# Patient Record
Sex: Male | Born: 1970
Health system: Southern US, Community
[De-identification: ages and names within clinical notes are randomized; demographics above are authoritative.]

## PROBLEM LIST (undated history)

## (undated) DIAGNOSIS — N289 Disorder of kidney and ureter, unspecified: Secondary | ICD-10-CM

## (undated) DIAGNOSIS — E119 Type 2 diabetes mellitus without complications: Secondary | ICD-10-CM

## (undated) HISTORY — PX: DG 3RD DIGIT RIGHT HAND: HXRAD1647

## (undated) HISTORY — PX: DG 4TH DIGIT RIGHT HAND: HXRAD1651

---

## 1997-08-27 ENCOUNTER — Emergency Department (HOSPITAL_COMMUNITY): Admission: EM | Admit: 1997-08-27 | Discharge: 1997-08-27 | Payer: Self-pay | Admitting: Emergency Medicine

## 1997-09-09 ENCOUNTER — Ambulatory Visit (HOSPITAL_BASED_OUTPATIENT_CLINIC_OR_DEPARTMENT_OTHER): Admission: RE | Admit: 1997-09-09 | Discharge: 1997-09-09 | Payer: Self-pay | Admitting: Orthopedic Surgery

## 1998-03-23 ENCOUNTER — Emergency Department (HOSPITAL_COMMUNITY): Admission: EM | Admit: 1998-03-23 | Discharge: 1998-03-23 | Payer: Self-pay | Admitting: Emergency Medicine

## 1998-04-04 ENCOUNTER — Emergency Department (HOSPITAL_COMMUNITY): Admission: EM | Admit: 1998-04-04 | Discharge: 1998-04-04 | Payer: Self-pay | Admitting: Emergency Medicine

## 1998-10-30 ENCOUNTER — Encounter: Payer: Self-pay | Admitting: Emergency Medicine

## 1998-10-30 ENCOUNTER — Emergency Department (HOSPITAL_COMMUNITY): Admission: EM | Admit: 1998-10-30 | Discharge: 1998-10-30 | Payer: Self-pay | Admitting: Emergency Medicine

## 2004-02-26 ENCOUNTER — Inpatient Hospital Stay (HOSPITAL_COMMUNITY): Admission: EM | Admit: 2004-02-26 | Discharge: 2004-02-28 | Payer: Self-pay | Admitting: Emergency Medicine

## 2004-02-26 ENCOUNTER — Ambulatory Visit: Payer: Self-pay | Admitting: Sports Medicine

## 2004-02-26 ENCOUNTER — Ambulatory Visit: Payer: Self-pay | Admitting: Gastroenterology

## 2004-03-22 ENCOUNTER — Emergency Department (HOSPITAL_COMMUNITY): Admission: EM | Admit: 2004-03-22 | Discharge: 2004-03-22 | Payer: Self-pay | Admitting: Emergency Medicine

## 2005-02-27 ENCOUNTER — Emergency Department (HOSPITAL_COMMUNITY): Admission: EM | Admit: 2005-02-27 | Discharge: 2005-02-27 | Payer: Self-pay | Admitting: Emergency Medicine

## 2006-02-25 ENCOUNTER — Emergency Department (HOSPITAL_COMMUNITY): Admission: EM | Admit: 2006-02-25 | Discharge: 2006-02-25 | Payer: Self-pay | Admitting: Emergency Medicine

## 2006-05-12 ENCOUNTER — Emergency Department (HOSPITAL_COMMUNITY): Admission: EM | Admit: 2006-05-12 | Discharge: 2006-05-12 | Payer: Self-pay | Admitting: Emergency Medicine

## 2006-10-23 ENCOUNTER — Ambulatory Visit: Payer: Self-pay | Admitting: *Deleted

## 2006-10-23 ENCOUNTER — Inpatient Hospital Stay (HOSPITAL_COMMUNITY): Admission: EM | Admit: 2006-10-23 | Discharge: 2006-10-25 | Payer: Self-pay | Admitting: *Deleted

## 2009-11-04 ENCOUNTER — Emergency Department (HOSPITAL_COMMUNITY)
Admission: EM | Admit: 2009-11-04 | Discharge: 2009-11-04 | Payer: Self-pay | Source: Home / Self Care | Admitting: Emergency Medicine

## 2010-06-14 NOTE — H&P (Signed)
[name], VACHA NO.:  [id number]   MEDICAL RECORD NO.:  [id number]          PATIENT TYPE:  IPS   LOCATION:  0303                          FACILITY:  [facility]   PHYSICIAN:  Jasmine Pang, M.D. DATE OF BIRTH:  [birth year]   DATE OF ADMISSION:  10/23/2006  DATE OF DISCHARGE:                       PSYCHIATRIC ADMISSION ASSESSMENT   IDENTIFYING INFORMATION:  This is a 40 year old separate African-  American male voluntarily admitted on October 23, 2006.   HISTORY OF PRESENT ILLNESS:  The patient presents with a history of  depression, polysubstance abuse and states that, before he did anything  stupid, he called the police.  He states that he is tired of doing  drugs, has been using marijuana and cocaine.  He states his use of  substances has been increasing, using almost daily, drinks alcohol on  rare occasions.  Having some passive suicidal thoughts.  He also reports  that he is having trouble sleeping, having problems going to sleep.  No  changes in his appetite.  Denies psychosis and is motivated to get help.   PAST PSYCHIATRIC HISTORY:  First admission to the [facility].  Is currently sponsored by [facility].  Was  detoxed prior about four years ago at [facility].  Reports a history of  overdosing in the past on sleeping pills.  Was not hospitalized.   SOCIAL HISTORY:  This is a 40 year old married African-American male.  Has been separated for four years.  The patient lives with a girlfriend  who he states is supportive.  He has two children, ages 48 and 27.  He is  unemployed, has been for nine months.  States, because of a felony where  he had sold drugs and a charge of larceny, he is unable to obtain  employment.  He is not on probation.   FAMILY HISTORY:  Kateri Mc was depressed.   ALCOHOL/DRUG HISTORY:  The patient smokes and alcohol habits as  described above.  Denies any IV drug use.   PRIMARY CARE PHYSICIAN:   None.   MEDICAL PROBLEMS:  Denies any acute or chronic health issues.   MEDICATIONS:  No current medication, has been on Zoloft in the past but  states he had problems with teeth clenching.   ALLERGIES:  No known allergies.   REVIEW OF SYSTEMS:  Muscular, well-nourished male.  No fever, no chills.  No changes in appetite.  Positive for insomnia.  No chest pain.  No  shortness of breath.  No nausea.  No vomiting or constipation.  No  seizures.  No headaches.  No joint tenderness.   PHYSICAL EXAMINATION:  VITAL SIGNS:  Temperature is 96.2, heart rate of  58, respirations 18, blood pressure 146/92, weight 236 pounds, height 6  feet tall.  GENERAL:  This is a muscular well-nourished male.  HEAD:  Head is atraumatic.  NECK:  Negative lymphadenopathy.  CHEST:  Clear.  No wheezing.  BREASTS:  Exam deferred.  HEART:  Regular rate and rhythm.  No murmurs, gallops or rubs.  ABDOMEN:  Soft, nontender abdomen.  PELVIC:  Exam was deferred.  GU:  Exam was deferred.  EXTREMITIES:  Moves all extremities.  No clubbing, no deformities. 5+  against resistance.  SKIN:  No rashes or lacerations.  There was a tattoo to his left arm.  NEUROLOGICAL:  Findings are intact and nonfocal.  No tremors.  Easily  performs heel-to-shin and normal alternating movements.   LABORATORY DATA:  Laboratory data is pending.   MENTAL STATUS EXAM:  This is a fully alert male, fair eye contact,  casually dressed, cooperative.  Speech is clear, normal pace and tone.  The patient's mood is depressed.  The patient's affect is somewhat flat  but agreeable.  Thought processes with no evidence of any thought  disorder.  Cognitive function intact.  Memory is good.  Judgment and  judgment is fair.   DIAGNOSES:  AXIS I:  Mood disorder.  Polysubstance abuse.  AXIS II:  Deferred.  AXIS III:  No acute or chronic health issues.  AXIS IV:  Problems with occupation, economic issues, psychosocial  problems.  AXIS V:  40-45.    PLAN:  Contract for safety.  Stabilize mood and thinking.  We will work  on relapse prevention.  We will have Symmetrel available for cocaine  cravings, Neurontin for anxiety, trazodone for sleep.  Will consider a  family session with his girlfriend.  [facility] is to assess follow-up  with the patient.  We will also assess comorbidities as the patient  continues to increase coping skills and work on his relapse.   TENTATIVE LENGTH OF STAY:  Three to four days.      Landry Corporal, N.P.      Jasmine Pang, M.D.  Electronically Signed    JO/MEDQ  D:  10/23/2006  T:  10/23/2006  Job:  [id number]

## 2010-06-17 NOTE — Discharge Summary (Signed)
[name], [name] NO.:  [id number]   MEDICAL RECORD NO.:  [id number]          PATIENT TYPE:  IPS   LOCATION:  0307                          FACILITY:  [facility]   PHYSICIAN:  Jasmine Pang, M.D. DATE OF BIRTH:  10/23/1970   DATE OF ADMISSION:  10/23/2006  DATE OF DISCHARGE:  10/25/2006                               DISCHARGE SUMMARY   IDENTIFYING INFORMATION:  This is a 40 year old separated African  American male who was admitted on a voluntary basis on October 23, 2006.   HISTORY OF PRESENT ILLNESS:  The patient presents with a history of  depression and polysubstance abuse.  He states that before he did  anything stupid he called the police.  He states he was tired of doing  drugs and had been using marijuana and cocaine.  He reported that his  use of substances has been increasing, and he is using almost daily.  He  also drinks alcohol on rare occasions.  He has been having some passive  suicidal thoughts.  He reports that he has been having trouble sleeping,  having problems going to sleep.  He has no changes in his appetite.  He  denies psychosis and is motivated to get help.  This is the first  admission to [facility].  He is currently sponsored by  [facility].  He was detoxed prior about 4  years ago at [facility].  He reports a history of overdosing in  the past on sleeping pills.  He was not hospitalized then.  He has an  uncle who was depressed.  No other remarkable family history.  He denies  any IV drug use.  He denies any acute or chronic health issues.  He is  on no current medication.  He has been on Zoloft in the past, but states  he had problems with teeth clenching.  He has no known drug allergies.   PHYSICAL FINDINGS:  Physical exam was within normal limits.  There were  no acute medical or physical problems noted.   ADMISSION LABORATORIES:  CBC was remarkable for a slightly elevated WBC  count of 11.3.  Routine chemistry panel was within normal limits, except  for slightly decreased sodium of 133.  The urine drug screen was  positive for marijuana and cocaine.   HOSPITAL COURSE:  Upon admission, the patient was started on trazodone  50 mg p.o. q.h.s. p.r.n. insomnia and Symmetrel 100 mg p.o. b.i.d.  He  was also given a nicotine 21 mg patch as per smoking cessation protocol.  He was prescribed Allegra 60 mg one p.o. b.i.d. p.r.n. symptoms and  Robitussin 10 mL q. 4h. p.r.n. cough.  The patient tolerated these  medications well with no significant side effects.  Upon admission, the  patient discussed feeling worried he would hurt someone.  He called the  police who took him to [facility], and they sent him to [place].  He  denied any suicidal intent, but did state he had been having passive  suicidal ideation.  He  admitted to smoking marijuana daily and also used  crack cocaine.  He wanted detox.  He stated his family was not very  supportive.  He reportedly threatened to walk into traffic if he did not  get help.  On October 24, 2006, mental status had improved from  admission status.  There was some dysphoria and anxiety, but he was no  longer suicidal or homicidal.  There was still some difficulty sleeping,  but his appetite was improving.  No auditory or visual hallucinations.  He stated he planned to go to AA meetings and therapy after he left the  hospital.  On October 25, 2006, mental status had improved markedly  from admission status.  The patient was friendly and cooperative with  good eye contact.  Speech was normal rate and flow.  Psychomotor  activity was within normal limits.  Mood euthymic.  Affect wide range.  No suicidal or homicidal ideation.  No thoughts of self-injurious  behavior.  No auditory or visual hallucinations.  No paranoid delusions.  Thoughts were logical and goal-directed.  Thought content no predominant  theme.  Cognitive was grossly  back to baseline.  The patient was felt  safe to be discharged today.  He will go to [facility] his  med[facility]  He  plans to return home.   DISCHARGE DIAGNOSES:  AXIS I:  Mood disorder, not otherwise specified.  Polysubstance abuse.  AXIS II:  None.  AXIS III:  No acute or chronic health issues.  AXIS IV:  Severe (problems with occupation and economic issues,  psychosocial problems, and burden of psychiatric illness).  AXIS V:  Global assessment of functioning was 50 at discharge.  Global  assessment of functioning was 40 upon admission.  Global assessment of  functioning highest past year was 65.   DISCHARGE PLANS:  There were no specific activity level or dietary  restrictions.   POST-HOSPITAL CARE PLANS:  The patient will go to [facility].  He will also be seen at [facility] and [facility] for  counseling.   DISCHARGE MEDICATIONS:  Amantadine 100 mg p.o. b.i.d. and trazodone 50  mg p.o. q.h.s.      Jasmine Pang, M.D.  Electronically Signed     BHS/MEDQ  D:  11/15/2006  T:  11/16/2006  Job:  [id number]

## 2010-06-17 NOTE — Discharge Summary (Signed)
[name], [name]                  ACCOUNT NO.:  [id number]   MEDICAL RECORD NO.:  [id number]          PATIENT TYPE:  INP   LOCATION:  4739                         FACILITY:  [facility]   PHYSICIAN:  Madeleine B. Vanstory, M.D.DATE OF BIRTH:  1970-12-05   DATE OF ADMISSION:  02/26/2004  DATE OF DISCHARGE:  02/28/2004                                 DISCHARGE SUMMARY   DISCHARGE DIAGNOSES:  1.  Gastric ulcers.  2.  Helicobacter pylori.  3.  Substance abuse.  4.  EKG changes.  5.  History of suicide attempt.   DISCHARGE MEDICATIONS:  1.  Amoxicillin 1000 mg p.o. b.i.d. x14 days.  2.  Clarithromycin 500 mg p.o. b.i.d. x14 days.  3.  Prilosec 40 mg p.o. b.i.d.   HOSPITAL COURSE:  [age]-year-old male with 10-year history of  intermittent crack cocaine abuse who presented with hemoptysis and melena.   Problem 1. GASTROINTESTINAL.  Patient with nausea, vomiting, diarrhea,  melena and hemoptysis, no frank blood per rectum.  GI consult ordered.  Patient positive for H. pylori and positive for numerous antral gastric  ulcers.  Patient started on PPI in the hospital and discharged with triple  therapy.  Patient to follow up output and to avoid alcohol, cigarettes and  NSAIDs as patient's suicide attempt was with numerous Tylenol and Advil  likely causing the gastritis.  Patient does not drink.   Problem 2. POLYSUBSTANCE ABUSE.  Patient has used crack cocaine  intermittently x10 years, last used December 2005 with suicide attempt;  patient states he has not used since then.  Patient amenable to rehab and  counseling.  Resources for substance abuse given at time of discharge.   Problem 3. EKG CHANGES.  Likely secondary to crack cocaine use.  Patient  advised on the EKG changes and that patient did not have a heart attack but  is headed that way and patient should cease using these drugs in the future.   Problem 4. HISTORY OF SUICIDE ATTEMPT.  Patient is not found depressed or  with  suicidal ideation at this time.  Note on chart police department wants  to be made aware at time of discharge as police to come to hospital to  interview patient prior to discharge.   DISCHARGE CONDITION:  Stable.   DISCHARGE INSTRUCTIONS:  Follow up at family practice.  Call clinic for an  appointment [phone].      MBV/MEDQ  D:  02/28/2004  T:  02/28/2004  Job:  [id number]

## 2010-11-10 LAB — URINE DRUGS OF ABUSE SCREEN W ALC, ROUTINE (REF LAB)
Amphetamine Screen, Ur: NEGATIVE
Benzodiazepines.: NEGATIVE
Cocaine Metabolites: POSITIVE — AB
Methadone: NEGATIVE
Opiate Screen, Urine: NEGATIVE
Phencyclidine (PCP): NEGATIVE
Propoxyphene: NEGATIVE

## 2010-11-10 LAB — COMPREHENSIVE METABOLIC PANEL
ALT: 16
AST: 16
Calcium: 9.2
Creatinine, Ser: 1.18
GFR calc Af Amer: >60
GFR calc non Af Amer: >60
Sodium: 133 — ABNORMAL LOW
Total Protein: 6.7

## 2010-11-10 LAB — COCAINE, URINE, CONFIRMATION: Benzoylecgonine GC/MS Conf: 29000 ng/mL

## 2010-11-10 LAB — THC (MARIJUANA), URINE, CONFIRMATION: Marijuana, Ur-Confirmation: 140 ng/mL

## 2010-11-10 LAB — CBC
MCHC: 35.1
MCV: 90.7
RDW: 13

## 2011-04-13 ENCOUNTER — Emergency Department (HOSPITAL_COMMUNITY)
Admission: EM | Admit: 2011-04-13 | Discharge: 2011-04-14 | Disposition: A | Discharge status: Home / Self Care | Payer: Self-pay | Attending: Emergency Medicine | Admitting: Emergency Medicine

## 2011-04-13 DIAGNOSIS — F17200 Nicotine dependence, unspecified, uncomplicated: Secondary | ICD-10-CM | POA: Insufficient documentation

## 2011-04-13 DIAGNOSIS — K4090 Unilateral inguinal hernia, without obstruction or gangrene, not specified as recurrent: Secondary | ICD-10-CM | POA: Insufficient documentation

## 2011-04-13 DIAGNOSIS — M5430 Sciatica, unspecified side: Secondary | ICD-10-CM | POA: Insufficient documentation

## 2011-04-13 NOTE — ED Notes (Signed)
Pt c/o abd pain on the lt  abd for one year and is worse when he coughs

## 2011-04-14 ENCOUNTER — Encounter (HOSPITAL_COMMUNITY): Payer: Self-pay | Admitting: *Deleted

## 2011-04-14 LAB — URINALYSIS, ROUTINE W REFLEX MICROSCOPIC
Bilirubin Urine: NEGATIVE
Glucose, UA: NEGATIVE mg/dL
Ketones, ur: NEGATIVE mg/dL
Leukocytes, UA: NEGATIVE
Nitrite: NEGATIVE
Specific Gravity, Urine: 1.013 (ref 1.005–1.030)
pH: 6 (ref 5.0–8.0)

## 2011-04-14 LAB — URINE MICROSCOPIC-ADD ON

## 2011-04-14 MED ORDER — IBUPROFEN 600 MG PO TABS: 600.0000 mg | ORAL_TABLET | Freq: Four times a day (QID) | ORAL | Status: AC | PRN

## 2011-04-14 MED ORDER — CYCLOBENZAPRINE HCL 10 MG PO TABS: 10.0000 mg | ORAL_TABLET | Freq: Three times a day (TID) | ORAL | Status: AC | PRN

## 2011-04-14 NOTE — Discharge Instructions (Signed)
Hernia  A hernia occurs when an internal organ pushes out through a weak spot in the abdominal wall. Hernias most commonly occur in the groin and around the navel. Hernias often can be pushed back into place (reduced). Most hernias tend to get worse over time. Some abdominal hernias can get stuck in the opening (irreducible or incarcerated hernia) and cannot be reduced. An irreducible abdominal hernia which is tightly squeezed into the opening is at risk for impaired blood supply (strangulated hernia). A strangulated hernia is a medical emergency. Because of the risk for an irreducible or strangulated hernia, surgery may be recommended to repair a hernia.  CAUSES    Heavy lifting.   Prolonged coughing.   Straining to have a bowel movement.   A cut (incision) made during an abdominal surgery.  HOME CARE INSTRUCTIONS    Bed rest is not required. You may continue your normal activities.   Avoid lifting more than 10 pounds (4.5 kg) or straining.   Cough gently. If you are a smoker it is best to stop. Even the best hernia repair can break down with the continual strain of coughing. Even if you do not have your hernia repaired, a cough will continue to aggravate the problem.   Do not wear anything tight over your hernia. Do not try to keep it in with an outside bandage or truss. These can damage abdominal contents if they are trapped within the hernia sac.   Eat a normal diet.   Avoid constipation. Straining over long periods of time will increase hernia size and encourage breakdown of repairs. If you cannot do this with diet alone, stool softeners may be used.  SEEK IMMEDIATE MEDICAL CARE IF:    You have a fever.   You develop increasing abdominal pain.   You feel nauseous or vomit.   Your hernia is stuck outside the abdomen, looks discolored, feels hard, or is tender.   You have any changes in your bowel habits or in the hernia that are unusual for you.   You have increased pain or swelling around the  hernia.   You cannot push the hernia back in place by applying gentle pressure while lying down.  MAKE SURE YOU:    Understand these instructions.   Will watch your condition.   Will get help right away if you are not doing well or get worse.  Document Released: 01/16/2005 Document Revised: 01/05/2011 Document Reviewed: 09/05/2007  ExitCare Patient Information 2012 [employer].

## 2011-04-14 NOTE — ED Provider Notes (Signed)
History     CSN: [id number]  Arrival date & time 04/13/11  2340   First MD Initiated Contact with Patient 04/14/11 0200      Chief Complaint  Patient presents with  . Abdominal Pain    (Consider location/radiation/quality/duration/timing/severity/associated sxs/prior treatment) HPI Comments: [name] is a chronic intermittent low back pain radiating to his left leg for the past year without any history of injury.  He also has noticed, that he's got a bulge in his left groin gets worse when he coughs for the last month and a half  Patient is a 41 y.o. male presenting with abdominal pain. The history is provided by the patient.  Abdominal Pain The primary symptoms of the illness include abdominal pain. The primary symptoms of the illness do not include fever, nausea or vomiting.  Additional symptoms associated with the illness include back pain.    History reviewed. No pertinent past medical history.  History reviewed. No pertinent past surgical history.  History reviewed. No pertinent family history.  History  Substance Use Topics  . Smoking status: Current Everyday Smoker  . Smokeless tobacco: Not on file  . Alcohol Use: Yes      Review of Systems  Constitutional: Negative for fever.  Gastrointestinal: Positive for abdominal pain. Negative for nausea and vomiting.  Genitourinary: Negative for flank pain, discharge, genital sores and testicular pain.  Musculoskeletal: Positive for back pain.  Neurological: Negative for dizziness and weakness.    Allergies  Review of patient's allergies indicates no known allergies.  Home Medications   Current Outpatient Rx  Name Route Sig Dispense Refill  . CYCLOBENZAPRINE HCL 10 MG PO TABS Oral Take 1 tablet (10 mg total) by mouth 3 (three) times daily as needed for muscle spasms. 30 tablet 0  . IBUPROFEN 600 MG PO TABS Oral Take 1 tablet (600 mg total) by mouth every 6 (six) hours as needed for pain. 30 tablet 0    BP 144/95   Pulse 65  Temp(Src) 97.6 F (36.4 C) (Oral)  Resp 18  SpO2 100%  Physical Exam  Constitutional: He appears well-developed and well-nourished.  HENT:  Head: Normocephalic.  Eyes: Pupils are equal, round, and reactive to light.  Neck: Normal range of motion.  Cardiovascular: Normal rate.   Pulmonary/Chest: Effort normal.  Abdominal: Soft. He exhibits no distension. A hernia is present.    Neurological: He is alert.  Skin: Skin is warm.    ED Course  Procedures (including critical care time)  Labs Reviewed  URINALYSIS, ROUTINE W REFLEX MICROSCOPIC - Abnormal; Notable for the following:    APPearance CLOUDY (*)    Hgb urine dipstick LARGE (*)    All other components within normal limits  URINE MICROSCOPIC-ADD ON   No results found.   1. Sciatica   2. Left inguinal hernia       MDM  She has a small left inguinal defect, which becomes more pronounced when he coughs.  Small hernia that becomes visible was easily reduced        Arman Filter, NP 04/14/11 0343  Arman Filter, NP 04/14/11 0343  Arman Filter, NP 04/14/11 0405  Arman Filter, NP 04/14/11 4098

## 2011-04-14 NOTE — ED Provider Notes (Signed)
Medical screening examination/treatment/procedure(s) were performed by non-physician practitioner and as supervising physician I was immediately available for consultation/collaboration.   Dayton Bailiff, MD 04/14/11 1006

## 2011-04-14 NOTE — ED Notes (Signed)
Patient found in waiting room, patient moved by to PDA 10.

## 2011-09-23 ENCOUNTER — Emergency Department (HOSPITAL_COMMUNITY)
Admission: EM | Admit: 2011-09-23 | Discharge: 2011-09-23 | Disposition: A | Discharge status: Home / Self Care | Payer: No Typology Code available for payment source | Attending: Emergency Medicine | Admitting: Emergency Medicine

## 2011-09-23 ENCOUNTER — Emergency Department (HOSPITAL_COMMUNITY): Payer: No Typology Code available for payment source

## 2011-09-23 ENCOUNTER — Encounter (HOSPITAL_COMMUNITY): Payer: Self-pay | Admitting: *Deleted

## 2011-09-23 DIAGNOSIS — Y9289 Other specified places as the place of occurrence of the external cause: Secondary | ICD-10-CM | POA: Insufficient documentation

## 2011-09-23 DIAGNOSIS — F17200 Nicotine dependence, unspecified, uncomplicated: Secondary | ICD-10-CM | POA: Insufficient documentation

## 2011-09-23 DIAGNOSIS — S335XXA Sprain of ligaments of lumbar spine, initial encounter: Secondary | ICD-10-CM | POA: Insufficient documentation

## 2011-09-23 DIAGNOSIS — M545 Low back pain, unspecified: Secondary | ICD-10-CM | POA: Insufficient documentation

## 2011-09-23 DIAGNOSIS — S39012A Strain of muscle, fascia and tendon of lower back, initial encounter: Secondary | ICD-10-CM

## 2011-09-23 MED ORDER — IBUPROFEN 800 MG PO TABS
800.0000 mg | ORAL_TABLET | Freq: Once | ORAL | Status: AC
  Administered 2011-09-23: 800 mg via ORAL
  Filled 2011-09-23: qty 1

## 2011-09-23 MED ORDER — HYDROCODONE-ACETAMINOPHEN 5-325 MG PO TABS
1.0000 | ORAL_TABLET | Freq: Once | ORAL | Status: AC
  Administered 2011-09-23: 1 via ORAL
  Filled 2011-09-23: qty 1

## 2011-09-23 MED ORDER — CYCLOBENZAPRINE HCL 10 MG PO TABS: 10.0000 mg | ORAL_TABLET | Freq: Three times a day (TID) | ORAL | Status: AC | PRN

## 2011-09-23 MED ORDER — HYDROCODONE-ACETAMINOPHEN 5-325 MG PO TABS: 1.0000 | ORAL_TABLET | Freq: Four times a day (QID) | ORAL | Status: AC | PRN

## 2011-09-23 MED ORDER — IBUPROFEN 800 MG PO TABS: 800.0000 mg | ORAL_TABLET | Freq: Three times a day (TID) | ORAL | Status: AC | PRN
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
## 2011-09-24 NOTE — ED Provider Notes (Signed)
Medical screening examination/treatment/procedure(s) were performed by non-physician practitioner and as supervising physician I was immediately available for consultation/collaboration.   Jameela Michna Y. Wayland Baik, MD 09/24/11 0725 

## 2013-03-03 ENCOUNTER — Ambulatory Visit: Payer: Self-pay

## 2013-08-25 ENCOUNTER — Encounter: Payer: Self-pay | Admitting: Internal Medicine

## 2013-08-25 ENCOUNTER — Ambulatory Visit: Payer: Self-pay | Attending: Internal Medicine | Admitting: Internal Medicine

## 2013-08-25 VITALS — BP 143/89 | HR 77 | Temp 98.6°F | Resp 16 | Ht 71.0 in | Wt 233.0 lb

## 2013-08-25 DIAGNOSIS — R220 Localized swelling, mass and lump, head: Secondary | ICD-10-CM

## 2013-08-25 DIAGNOSIS — R229 Localized swelling, mass and lump, unspecified: Secondary | ICD-10-CM | POA: Insufficient documentation

## 2013-08-25 DIAGNOSIS — R221 Localized swelling, mass and lump, neck: Secondary | ICD-10-CM

## 2013-08-25 DIAGNOSIS — Z0000 Encounter for general adult medical examination without abnormal findings: Secondary | ICD-10-CM | POA: Insufficient documentation

## 2013-08-25 DIAGNOSIS — F17200 Nicotine dependence, unspecified, uncomplicated: Secondary | ICD-10-CM | POA: Insufficient documentation

## 2013-08-25 LAB — CBC WITH DIFFERENTIAL/PLATELET
Basophils Absolute: 0.1 10*3/uL (ref 0.0–0.1)
Basophils Relative: 1 % (ref 0–1)
Eosinophils Absolute: 0.3 10*3/uL (ref 0.0–0.7)
Eosinophils Relative: 3 % (ref 0–5)
HCT: 42.5 % (ref 39.0–52.0)
HEMOGLOBIN: 14.7 g/dL (ref 13.0–17.0)
LYMPHS ABS: 3.8 10*3/uL (ref 0.7–4.0)
LYMPHS PCT: 39 % (ref 12–46)
MCH: 31.7 pg (ref 26.0–34.0)
MCHC: 34.6 g/dL (ref 30.0–36.0)
MCV: 91.6 fL (ref 78.0–100.0)
MONOS PCT: 10 % (ref 3–12)
Monocytes Absolute: 1 10*3/uL (ref 0.1–1.0)
NEUTROS ABS: 4.6 10*3/uL (ref 1.7–7.7)
NEUTROS PCT: 47 % (ref 43–77)
Platelets: 425 10*3/uL — ABNORMAL HIGH (ref 150–400)
RBC: 4.64 MIL/uL (ref 4.22–5.81)
RDW: 13.1 % (ref 11.5–15.5)
WBC: 9.7 10*3/uL (ref 4.0–10.5)

## 2013-08-25 LAB — POCT URINALYSIS DIPSTICK
GLUCOSE UA: NEGATIVE
NITRITE UA: NEGATIVE
Protein, UA: 300
Spec Grav, UA: >=1.03
Urobilinogen, UA: 1
pH, UA: 6

## 2013-08-25 LAB — GLUCOSE, POCT (MANUAL RESULT ENTRY): POC Glucose: 92 mg/dl (ref 70–99)

## 2013-08-25 LAB — POCT GLYCOSYLATED HEMOGLOBIN (HGB A1C): HEMOGLOBIN A1C: 4.6

## 2013-08-25 MED ORDER — IBUPROFEN 800 MG PO TABS: 800.0000 mg | ORAL_TABLET | Freq: Three times a day (TID) | ORAL | Status: DC | PRN

## 2013-08-25 MED ORDER — CYCLOBENZAPRINE HCL 10 MG PO TABS: 10.0000 mg | ORAL_TABLET | Freq: Three times a day (TID) | ORAL | Status: DC | PRN

## 2013-08-25 NOTE — Patient Instructions (Signed)
Back Pain, Adult Low back pain is very common. About 1 in 5 people have back pain.The cause of low back pain is rarely dangerous. The pain often gets better over time.About half of people with a sudden onset of back pain feel better in just 2 weeks. About 8 in 10 people feel better by 6 weeks.  CAUSES Some common causes of back pain include:  Strain of the muscles or ligaments supporting the spine.  Wear and tear (degeneration) of the spinal discs.  Arthritis.  Direct injury to the back. DIAGNOSIS Most of the time, the direct cause of low back pain is not known.However, back pain can be treated effectively even when the exact cause of the pain is unknown.Answering your caregiver's questions about your overall health and symptoms is one of the most accurate ways to make sure the cause of your pain is not dangerous. If your caregiver needs more information, he or she may order lab work or imaging tests (X-rays or MRIs).However, even if imaging tests show changes in your back, this usually does not require surgery. HOME CARE INSTRUCTIONS For many people, back pain returns.Since low back pain is rarely dangerous, it is often a condition that people can learn to manageon their own.   Remain active. It is stressful on the back to sit or stand in one place. Do not sit, drive, or stand in one place for more than 30 minutes at a time. Take short walks on level surfaces as soon as pain allows.Try to increase the length of time you walk each day.  Do not stay in bed.Resting more than 1 or 2 days can delay your recovery.  Do not avoid exercise or work.Your body is made to move.It is not dangerous to be active, even though your back may hurt.Your back will likely heal faster if you return to being active before your pain is gone.  Pay attention to your body when you bend and lift. Many people have less discomfortwhen lifting if they bend their knees, keep the load close to their bodies,and  avoid twisting. Often, the most comfortable positions are those that put less stress on your recovering back.  Find a comfortable position to sleep. Use a firm mattress and lie on your side with your knees slightly bent. If you lie on your back, put a pillow under your knees.  Only take over-the-counter or prescription medicines as directed by your caregiver. Over-the-counter medicines to reduce pain and inflammation are often the most helpful.Your caregiver may prescribe muscle relaxant drugs.These medicines help dull your pain so you can more quickly return to your normal activities and healthy exercise.  Put ice on the injured area.  Put ice in a plastic bag.  Place a towel between your skin and the bag.  Leave the ice on for 15-20 minutes, 03-04 times a day for the first 2 to 3 days. After that, ice and heat may be alternated to reduce pain and spasms.  Ask your caregiver about trying back exercises and gentle massage. This may be of some benefit.  Avoid feeling anxious or stressed.Stress increases muscle tension and can worsen back pain.It is important to recognize when you are anxious or stressed and learn ways to manage it.Exercise is a great option. SEEK MEDICAL CARE IF:  You have pain that is not relieved with rest or medicine.  You have pain that does not improve in 1 week.  You have new symptoms.  You are generally not feeling well. SEEK   IMMEDIATE MEDICAL CARE IF:   You have pain that radiates from your back into your legs.  You develop new bowel or bladder control problems.  You have unusual weakness or numbness in your arms or legs.  You develop nausea or vomiting.  You develop abdominal pain.  You feel faint. Document Released: 01/16/2005 Document Revised: 07/18/2011 Document Reviewed: 05/20/2013 [employer] Patient Information 2015 [employer]. This information is not intended to replace advice given to you by your health care provider. Make sure you  discuss any questions you have with your health care provider.  

## 2013-08-25 NOTE — Progress Notes (Signed)
Pt is here to establish care. Pt reports having chronic lower back pain. Pt is requesting a physical. Pt has a cyst above his right eye.

## 2013-08-25 NOTE — Progress Notes (Signed)
Patient ID: [name], male   DOB: 1970/12/19, 43 y.o.   MRN: [id number]   [name], is a 44 y.o. male  XAJ:[id number]  CNO:[id number]  DOB - [birth year]  CC:  Chief Complaint  Patient presents with  . Establish Care       HPI: [name] is a 43 y.o. male here today to establish medical care. Mr. Seminara presents today with a pulsatile nodule on his right forehead. He reports first noting the nodule 3.5-4 years ago. It has increased in size and has become more painful and tender. It is present all the time and hurts worse at night so that it disturbs his sleep. He rates the pain as a 9-10 at night when it wakes him from his sleep. He denies any past medical history, smokes a pack a day, no drug use, and  4-40 oz alcohol beverages on the weekends occasionally.    No Known Allergies History reviewed. No pertinent past medical history. No current outpatient prescriptions on file prior to visit.   No current facility-administered medications on file prior to visit.   Family History  Problem Relation Age of Onset  . Heart disease Father    History   Social History  . Marital Status: Single    Spouse Name: N/A    Number of Children: N/A  . Years of Education: N/A   Occupational History  . Not on file.   Social History Main Topics  . Smoking status: Current Every Day Smoker  . Smokeless tobacco: Not on file  . Alcohol Use: Yes  . Drug Use:   . Sexual Activity:    Other Topics Concern  . Not on file   Social History Narrative  . No narrative on file    Review of Systems  Constitutional: Negative.   Eyes: Negative.   Respiratory: Negative.   Cardiovascular: Negative.   Gastrointestinal: Negative.   Genitourinary: Negative.   Musculoskeletal: Positive for back pain.  Skin: Negative.   Neurological: Positive for dizziness and headaches.  Endo/Heme/Allergies: Negative.   Psychiatric/Behavioral: Negative.      Objective:   Filed Vitals:   08/25/13 1643  BP:  143/89  Pulse: 77  Temp: 98.6 F (37 C)  Resp: 16    Physical Exam  Constitutional: He is oriented to person, place, and time. He appears well-developed and well-nourished.  HENT:  Head: Normocephalic. Head is with contusion.    Right Ear: Hearing, tympanic membrane, external ear and ear canal normal.  Left Ear: Hearing, tympanic membrane, external ear and ear canal normal.  Nose: Nose normal.  Mouth/Throat: Oropharynx is clear and moist.  Eyes: Conjunctivae, EOM and lids are normal. Pupils are equal, round, and reactive to light.  Neck: Normal range of motion. Neck supple. Carotid bruit is not present. No mass and no thyromegaly present.  Cardiovascular: Normal rate, regular rhythm, S1 normal, S2 normal, normal heart sounds, intact distal pulses and normal pulses.   Pulmonary/Chest: Effort normal and breath sounds normal.  Abdominal: Soft. Normal appearance and bowel sounds are normal.  Musculoskeletal: Normal range of motion.  Lymphadenopathy:       Head (right side): No submental, no submandibular, no tonsillar, no preauricular, no posterior auricular and no occipital adenopathy present.       Head (left side): No submental, no submandibular, no tonsillar, no preauricular, no posterior auricular and no occipital adenopathy present.       Right cervical: No superficial cervical, no deep cervical and no posterior  cervical adenopathy present.      Left cervical: No superficial cervical, no deep cervical and no posterior cervical adenopathy present.  Neurological: He is alert and oriented to person, place, and time. He has normal strength and normal reflexes. No cranial nerve deficit or sensory deficit.  Skin: Skin is warm, dry and intact. No cyanosis. Nails show no clubbing.  Psychiatric: He has a normal mood and affect. His speech is normal and behavior is normal. Judgment and thought content normal. Cognition and memory are normal.     Lab Results  Component Value Date   WBC  11.3* 10/23/2006   HGB 14.2 10/23/2006   HCT 40.4 10/23/2006   MCV 90.7 10/23/2006   PLT 386 10/23/2006   Lab Results  Component Value Date   CREATININE 1.18 10/23/2006   BUN 11 10/23/2006   NA 133* 10/23/2006   K 3.6 10/23/2006   CL 102 10/23/2006   CO2 26 10/23/2006    Lab Results  Component Value Date   HGBA1C 4.6 08/25/2013   Lipid Panel  No results found for this basename: chol, trig, hdl, cholhdl, vldl, ldlcalc       Assessment and plan:   1. Preventative health care  - Glucose (CBG) - HgB A1c - Urinalysis Dipstick - CBC with Differential - COMPLETE METABOLIC PANEL WITH GFR - Lipid panel - TSH  2. Scalp lump, pulsatile.   - CT Head Wo Contrast; Future  - ibuprofen (ADVIL,MOTRIN) 800 MG tablet; Take 1 tablet (800 mg total) by mouth every 8 (eight) hours as needed for headache.  Dispense: 30 tablet; Refill: 0 - cyclobenzaprine (FLEXERIL) 10 MG tablet; Take 1 tablet (10 mg total) by mouth 3 (three) times daily as needed for muscle spasms.  Dispense: 30 tablet; Refill: 0  Patient was counseled extensively about nutrition and exercise Patient was counseled about smoking cessation  Return in about 6 months (around 02/25/2014), or if symptoms worsen or fail to improve, for Follow up Pain and comorbidities.  The patient was given clear instructions to go to ER or return to medical center if symptoms don't improve, worsen or new problems develop. The patient verbalized understanding. The patient was told to call to get lab results if they haven't heard anything in the next week.     This note has been created with Surveyor, quantity. Any transcriptional errors are unintentional.    Angelica Chessman, MD, [place], [place], [place], [place]   08/25/2013, 5:37 PM

## 2013-08-26 LAB — COMPLETE METABOLIC PANEL WITH GFR
ALBUMIN: 4.5 g/dL (ref 3.5–5.2)
ALT: 13 U/L (ref 0–53)
AST: 19 U/L (ref 0–37)
Alkaline Phosphatase: 63 U/L (ref 39–117)
BUN: 10 mg/dL (ref 6–23)
CO2: 25 meq/L (ref 19–32)
Calcium: 10 mg/dL (ref 8.4–10.5)
Chloride: 105 mEq/L (ref 96–112)
Creat: 1.25 mg/dL (ref 0.50–1.35)
GFR, EST AFRICAN AMERICAN: 82 mL/min
GFR, EST NON AFRICAN AMERICAN: 71 mL/min
GLUCOSE: 97 mg/dL (ref 70–99)
POTASSIUM: 4.6 meq/L (ref 3.5–5.3)
SODIUM: 140 meq/L (ref 135–145)
TOTAL PROTEIN: 7.4 g/dL (ref 6.0–8.3)
Total Bilirubin: 0.4 mg/dL (ref 0.2–1.2)

## 2013-08-26 LAB — LIPID PANEL
CHOL/HDL RATIO: 5.2 ratio
Cholesterol: 227 mg/dL — ABNORMAL HIGH (ref 0–200)
HDL: 44 mg/dL (ref >39)
LDL CALC: 131 mg/dL — AB (ref 0–99)
Triglycerides: 259 mg/dL — ABNORMAL HIGH (ref <150)
VLDL: 52 mg/dL — ABNORMAL HIGH (ref 0–40)

## 2013-08-26 LAB — TSH: TSH: 1.29 u[IU]/mL (ref 0.350–4.500)

## 2013-08-29 ENCOUNTER — Ambulatory Visit (HOSPITAL_COMMUNITY)
Admission: RE | Admit: 2013-08-29 | Discharge: 2013-08-29 | Disposition: A | Discharge status: Home / Self Care | Payer: No Typology Code available for payment source | Source: Ambulatory Visit | Attending: Internal Medicine | Admitting: Internal Medicine

## 2013-08-29 DIAGNOSIS — R220 Localized swelling, mass and lump, head: Secondary | ICD-10-CM | POA: Insufficient documentation

## 2013-08-29 DIAGNOSIS — R221 Localized swelling, mass and lump, neck: Principal | ICD-10-CM

## 2013-09-03 ENCOUNTER — Telehealth: Payer: Self-pay | Admitting: Emergency Medicine

## 2013-09-03 DIAGNOSIS — D170 Benign lipomatous neoplasm of skin and subcutaneous tissue of head, face and neck: Secondary | ICD-10-CM

## 2013-09-03 MED ORDER — SIMVASTATIN 10 MG PO TABS: 10.0000 mg | ORAL_TABLET | Freq: Every day | ORAL | Status: DC

## 2013-09-03 NOTE — Telephone Encounter (Signed)
Pt given lab results with medication instructions to start taking prescribed Simvastatin 10 mg tab daily with diet/exercise control Medication e-scribed to [facility] surgery referral placed

## 2013-09-03 NOTE — Telephone Encounter (Signed)
Message copied by Ricci Barker on Wed Sep 03, 2013  3:43 PM ------      Message from: Tresa Garter      Created: Fri Aug 29, 2013  5:59 PM       Please inform patient that his laboratory tests results are mostly within normal limit except for his cholesterol. And his CT head shows a small fat tissue in the area of the swelling of his head.      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

## 2013-11-27 ENCOUNTER — Emergency Department (INDEPENDENT_AMBULATORY_CARE_PROVIDER_SITE_OTHER)
Admission: EM | Admit: 2013-11-27 | Discharge: 2013-11-27 | Disposition: A | Discharge status: Home / Self Care | Payer: Self-pay | Source: Home / Self Care | Attending: Family Medicine | Admitting: Family Medicine

## 2013-11-27 ENCOUNTER — Encounter (HOSPITAL_COMMUNITY): Payer: Self-pay | Admitting: Emergency Medicine

## 2013-11-27 DIAGNOSIS — M5432 Sciatica, left side: Secondary | ICD-10-CM

## 2013-11-27 MED ORDER — NAPROXEN 500 MG PO TABS: 500.0000 mg | ORAL_TABLET | Freq: Two times a day (BID) | ORAL | Status: DC

## 2013-11-27 NOTE — Discharge Instructions (Signed)
Sciatica Sciatica is pain, weakness, numbness, or tingling along the path of the sciatic nerve. The nerve starts in the lower back and runs down the back of each leg. The nerve controls the muscles in the lower leg and in the back of the knee, while also providing sensation to the back of the thigh, lower leg, and the sole of your foot. Sciatica is a symptom of another medical condition. For instance, nerve damage or certain conditions, such as a herniated disk or bone spur on the spine, pinch or put pressure on the sciatic nerve. This causes the pain, weakness, or other sensations normally associated with sciatica. Generally, sciatica only affects one side of the body. CAUSES   Herniated or slipped disc.  Degenerative disk disease.  A pain disorder involving the narrow muscle in the buttocks (piriformis syndrome).  Pelvic injury or fracture.  Pregnancy.  Tumor (rare). SYMPTOMS  Symptoms can vary from mild to very severe. The symptoms usually travel from the low back to the buttocks and down the back of the leg. Symptoms can include:  Mild tingling or dull aches in the lower back, leg, or hip.  Numbness in the back of the calf or sole of the foot.  Burning sensations in the lower back, leg, or hip.  Sharp pains in the lower back, leg, or hip.  Leg weakness.  Severe back pain inhibiting movement. These symptoms may get worse with coughing, sneezing, laughing, or prolonged sitting or standing. Also, being overweight may worsen symptoms. DIAGNOSIS  Your caregiver will perform a physical exam to look for common symptoms of sciatica. He or she may ask you to do certain movements or activities that would trigger sciatic nerve pain. Other tests may be performed to find the cause of the sciatica. These may include:  Blood tests.  X-rays.  Imaging tests, such as an MRI or CT scan. TREATMENT  Treatment is directed at the cause of the sciatic pain. Sometimes, treatment is not necessary  and the pain and discomfort goes away on its own. If treatment is needed, your caregiver may suggest:  Over-the-counter medicines to relieve pain.  Prescription medicines, such as anti-inflammatory medicine, muscle relaxants, or narcotics.  Applying heat or ice to the painful area.  Steroid injections to lessen pain, irritation, and inflammation around the nerve.  Reducing activity during periods of pain.  Exercising and stretching to strengthen your abdomen and improve flexibility of your spine. Your caregiver may suggest losing weight if the extra weight makes the back pain worse.  Physical therapy.  Surgery to eliminate what is pressing or pinching the nerve, such as a bone spur or part of a herniated disk. HOME CARE INSTRUCTIONS   Only take over-the-counter or prescription medicines for pain or discomfort as directed by your caregiver.  Apply ice to the affected area for 20 minutes, 3-4 times a day for the first 48-72 hours. Then try heat in the same way.  Exercise, stretch, or perform your usual activities if these do not aggravate your pain.  Attend physical therapy sessions as directed by your caregiver.  Keep all follow-up appointments as directed by your caregiver.  Do not wear high heels or shoes that do not provide proper support.  Check your mattress to see if it is too soft. A firm mattress may lessen your pain and discomfort. SEEK IMMEDIATE MEDICAL CARE IF:   You lose control of your bowel or bladder (incontinence).  You have increasing weakness in the lower back, pelvis, buttocks,   or legs.  You have redness or swelling of your back.  You have a burning sensation when you urinate.  You have pain that gets worse when you lie down or awakens you at night.  Your pain is worse than you have experienced in the past.  Your pain is lasting longer than 4 weeks.  You are suddenly losing weight without reason. MAKE SURE YOU:  Understand these  instructions.  Will watch your condition.  Will get help right away if you are not doing well or get worse. Document Released: 01/10/2001 Document Revised: 07/18/2011 Document Reviewed: 05/28/2011 [employer] Patient Information 2015 [employer]. This information is not intended to replace advice given to you by your health care provider. Make sure you discuss any questions you have with your health care provider.  

## 2013-11-27 NOTE — ED Provider Notes (Signed)
CSN: [id number]     Arrival date & time 11/27/13  1544 History   First MD Initiated Contact with Patient 11/27/13 1554     Chief Complaint  Patient presents with  . Back Pain   (Consider location/radiation/quality/duration/timing/severity/associated sxs/prior Treatment) HPI Comments: No fever No bowel or bladder incontinence. No saddle anesthesia No rash No changes in strength or sensation  Patient is a 43 y.o. male presenting with back pain. The history is provided by the patient.  Back Pain Location:  Sacro-iliac joint (+left lower back) Quality:  Shooting Radiates to: +left buttock. Pain severity:  Mild Onset quality:  Gradual Duration:  1 day Timing:  Constant Progression:  Improving Chronicity:  New (feels symptoms began after moving some boxes at his house last night) Relieved by:  None tried Ineffective treatments:  None tried Associated symptoms comment:  None   History reviewed. No pertinent past medical history. History reviewed. No pertinent past surgical history. Family History  Problem Relation Age of Onset  . Heart disease Father    History  Substance Use Topics  . Smoking status: Current Every Day Smoker  . Smokeless tobacco: Not on file  . Alcohol Use: Yes    Review of Systems  Musculoskeletal: Positive for back pain.  All other systems reviewed and are negative.   Allergies  Review of patient's allergies indicates no known allergies.  Home Medications   Prior to Admission medications   Medication Sig Start Date End Date Taking? Authorizing Provider  cyclobenzaprine (FLEXERIL) 10 MG tablet Take 1 tablet (10 mg total) by mouth 3 (three) times daily as needed for muscle spasms. 08/25/13   Tresa Garter, MD  ibuprofen (ADVIL,MOTRIN) 800 MG tablet Take 1 tablet (800 mg total) by mouth every 8 (eight) hours as needed for headache. 08/25/13   Tresa Garter, MD  naproxen (NAPROSYN) 500 MG tablet Take 1 tablet (500 mg total) by mouth 2 (two)  times daily with a meal. As needed for back pain 11/27/13   Audelia Hives Dehlia Kilner, PA  simvastatin (ZOCOR) 10 MG tablet Take 1 tablet (10 mg total) by mouth at bedtime. 09/03/13   Tresa Garter, MD   BP 145/80  Pulse 72  Temp(Src) 98.2 F (36.8 C) (Oral)  Resp 14  SpO2 98% Physical Exam  Nursing note and vitals reviewed. Constitutional: He is oriented to person, place, and time. He appears well-developed and well-nourished. No distress.  HENT:  Head: Normocephalic and atraumatic.  Eyes: Conjunctivae are normal. No scleral icterus.  Cardiovascular: Normal rate.   Pulmonary/Chest: Effort normal.  Musculoskeletal: Normal range of motion.       Back:  CSM exam of bilateral lower extremities is normal Patellar DTRs 2+ B/L  Neurological: He is alert and oriented to person, place, and time.  Skin: Skin is warm and dry. No rash noted. No erythema.  Psychiatric: He has a normal mood and affect. His behavior is normal.    ED Course  Procedures (including critical care time) Labs Review Labs Reviewed - No data to display  Imaging Review No results found.   MDM   1. Sciatica neuralgia, left   Naprosyn as prescribed Exam does not suggest cauda equina syndrome or other acute neuromuscular deficit.  Advised to limit heavy or repetitive lifting over next few days. Follow up PCP if symptoms persist.    Lutricia Feil, PA 11/27/13 1622

## 2013-11-27 NOTE — ED Notes (Signed)
Reports lower back pain.  States "I think I pulled something while lifting a box".  No otc meds taken for pain.  Symptoms present x 1 wk.

## 2014-04-18 ENCOUNTER — Encounter (HOSPITAL_COMMUNITY): Payer: Self-pay | Admitting: *Deleted

## 2014-04-18 ENCOUNTER — Emergency Department (HOSPITAL_COMMUNITY)
Admission: EM | Admit: 2014-04-18 | Discharge: 2014-04-18 | Disposition: A | Discharge status: Home / Self Care | Payer: Self-pay | Attending: Emergency Medicine | Admitting: Emergency Medicine

## 2014-04-18 DIAGNOSIS — Z79899 Other long term (current) drug therapy: Secondary | ICD-10-CM | POA: Insufficient documentation

## 2014-04-18 DIAGNOSIS — Y9389 Activity, other specified: Secondary | ICD-10-CM | POA: Insufficient documentation

## 2014-04-18 DIAGNOSIS — Y998 Other external cause status: Secondary | ICD-10-CM | POA: Insufficient documentation

## 2014-04-18 DIAGNOSIS — Y9289 Other specified places as the place of occurrence of the external cause: Secondary | ICD-10-CM | POA: Insufficient documentation

## 2014-04-18 DIAGNOSIS — S39012A Strain of muscle, fascia and tendon of lower back, initial encounter: Secondary | ICD-10-CM | POA: Insufficient documentation

## 2014-04-18 DIAGNOSIS — X58XXXA Exposure to other specified factors, initial encounter: Secondary | ICD-10-CM | POA: Insufficient documentation

## 2014-04-18 DIAGNOSIS — Z720 Tobacco use: Secondary | ICD-10-CM | POA: Insufficient documentation

## 2014-04-18 DIAGNOSIS — Z791 Long term (current) use of non-steroidal anti-inflammatories (NSAID): Secondary | ICD-10-CM | POA: Insufficient documentation

## 2014-04-18 NOTE — Discharge Instructions (Signed)
Asked, avoid heavy lifting or hard physical activity over the weekend. Take ibuprofen, 600-800 mg every 6-8 hours for pain. Follow-up with your primary care physician. Apply a heating pad to your back.  Lumbosacral Strain Lumbosacral strain is a strain of any of the parts that make up your lumbosacral vertebrae. Your lumbosacral vertebrae are the bones that make up the lower third of your backbone. Your lumbosacral vertebrae are held together by muscles and tough, fibrous tissue (ligaments).  CAUSES  A sudden blow to your back can cause lumbosacral strain. Also, anything that causes an excessive stretch of the muscles in the low back can cause this strain. This is typically seen when people exert themselves strenuously, fall, lift heavy objects, bend, or crouch repeatedly. RISK FACTORS  Physically demanding work.  Participation in pushing or pulling sports or sports that require a sudden twist of the back (tennis, golf, baseball).  Weight lifting.  Excessive lower back curvature.  Forward-tilted pelvis.  Weak back or abdominal muscles or both.  Tight hamstrings. SIGNS AND SYMPTOMS  Lumbosacral strain may cause pain in the area of your injury or pain that moves (radiates) down your leg.  DIAGNOSIS Your health care provider can often diagnose lumbosacral strain through a physical exam. In some cases, you may need tests such as X-ray exams.  TREATMENT  Treatment for your lower back injury depends on many factors that your clinician will have to evaluate. However, most treatment will include the use of anti-inflammatory medicines. HOME CARE INSTRUCTIONS   Avoid hard physical activities (tennis, racquetball, waterskiing) if you are not in proper physical condition for it. This may aggravate or create problems.  If you have a back problem, avoid sports requiring sudden body movements. Swimming and walking are generally safer activities.  Maintain good posture.  Maintain a healthy  weight.  For acute conditions, you may put ice on the injured area.  Put ice in a plastic bag.  Place a towel between your skin and the bag.  Leave the ice on for 20 minutes, 2-3 times a day.  When the low back starts healing, stretching and strengthening exercises may be recommended. SEEK MEDICAL CARE IF:  Your back pain is getting worse.  You experience severe back pain not relieved with medicines. SEEK IMMEDIATE MEDICAL CARE IF:   You have numbness, tingling, weakness, or problems with the use of your arms or legs.  There is a change in bowel or bladder control.  You have increasing pain in any area of the body, including your belly (abdomen).  You notice shortness of breath, dizziness, or feel faint.  You feel sick to your stomach (nauseous), are throwing up (vomiting), or become sweaty.  You notice discoloration of your toes or legs, or your feet get very cold. MAKE SURE YOU:   Understand these instructions.  Will watch your condition.  Will get help right away if you are not doing well or get worse. Document Released: 10/26/2004 Document Revised: 01/21/2013 Document Reviewed: 09/04/2012 [facility] Patient Information 2015 [place], [place]. This information is not intended to replace advice given to you by your health care provider. Make sure you discuss any questions you have with your health care provider.

## 2014-04-18 NOTE — ED Notes (Signed)
The pt is  C/o  Lower back pain .  He was having pain putting his pants on this am  He called his job and they told him he needed a doctors note.  He has chronic back pain

## 2014-04-18 NOTE — ED Provider Notes (Signed)
CSN: [id number]     Arrival date & time 04/18/14  1602 History  This chart was scribed for Jonathan Mons, PA-C working with [name], MD by Mercy Moore, ED Scribe. This patient was seen in room TR05C/TR05C and the patient's care was started at 5:13 PM.   Chief Complaint  Patient presents with  . Back Pain    The history is provided by the patient. No language interpreter was used.   HPI Comments: [name] is a 44 y.o. male who presents to the Emergency Department complaining of lower back pain, onset this morning upon wakening. Patient reports pain especially when attempting to put on his pants. States his pain was so severe he called out of work. 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 Patient reports treatment with Motrin after arriving home last night and today and states that this reduced the swelling in his back. Patient states that back pain is not nearly as severe as it was this morning. Patient denies previous back injury, but does reports history of back pain. Patient denies bladder/bowel incontinence, numbness or weakness.   History reviewed. No pertinent past medical history. History reviewed. No pertinent past surgical history. Family History  Problem Relation Age of Onset  . Heart disease Father    History  Substance Use Topics  . Smoking status: Current Every Day Smoker  . Smokeless tobacco: Not on file  . Alcohol Use: Yes    Review of Systems  Constitutional: Negative for fever and chills.  Gastrointestinal: Negative for nausea and abdominal pain.  Genitourinary: Negative for dysuria and hematuria.  Musculoskeletal: Positive for myalgias and back pain.  Skin: Negative for rash.  Neurological: Negative for weakness and numbness.  All other systems reviewed and are negative.     Allergies  Review of patient's allergies indicates no known allergies.  Home Medications   Prior to Admission medications   Medication Sig Start  Date End Date Taking? Authorizing Provider  cyclobenzaprine (FLEXERIL) 10 MG tablet Take 1 tablet (10 mg total) by mouth 3 (three) times daily as needed for muscle spasms. 08/25/13   Tresa Garter, MD  ibuprofen (ADVIL,MOTRIN) 800 MG tablet Take 1 tablet (800 mg total) by mouth every 8 (eight) hours as needed for headache. 08/25/13   Tresa Garter, MD  naproxen (NAPROSYN) 500 MG tablet Take 1 tablet (500 mg total) by mouth 2 (two) times daily with a meal. As needed for back pain 11/27/13   Audelia Hives Presson, PA  simvastatin (ZOCOR) 10 MG tablet Take 1 tablet (10 mg total) by mouth at bedtime. 09/03/13   Tresa Garter, MD   Triage Vitals: BP 135/88 mmHg  Pulse 59  Temp(Src) 97.3 F (36.3 C) (Oral)  Resp 18  SpO2 96% Physical Exam  Constitutional: He is oriented to person, place, and time. He appears well-developed and well-nourished. No distress.  HENT:  Head: Normocephalic and atraumatic.  Mouth/Throat: Oropharynx is clear and moist.  Eyes: Conjunctivae and EOM are normal.  Neck: Normal range of motion. Neck supple. No spinous process tenderness and no muscular tenderness present.  Cardiovascular: Normal rate, regular rhythm and normal heart sounds.   Pulmonary/Chest: Effort normal and breath sounds normal. No respiratory distress.  Musculoskeletal: Normal range of motion. He exhibits no edema.  TTP bilateral lumbar paraspinal muscles without spasm. No spinous process tenderness. FROM.  Neurological: He is alert and oriented to person, place, and time. He has normal strength.  Strength  lower extremities 5/5 and equal bilateral. Sensation intact. Normal gait.  Skin: Skin is warm and dry. No rash noted. He is not diaphoretic.  Psychiatric: He has a normal mood and affect. His behavior is normal.  Nursing note and vitals reviewed.   ED Course  Procedures (including critical care time)  COORDINATION OF CARE: 5:18 PM- Patient advised to continue treatment with Motrin  and heat therapy. Discussed treatment plan with patient at bedside and patient agreed to plan.   Labs Review Labs Reviewed - No data to display  Imaging Review No results found.   EKG Interpretation None      MDM   Final diagnoses:  Lumbar strain, initial encounter   NAD. AFVSS. Neurovascularly intact. No focal neurologic deficits. No red flags concerning patient's back pain. No s/s of central cord compression or cauda equina. Lower extremities are neurovascularly intact and patient is ambulating without difficulty. Advised rest, heat, NSAIDs. Follow-up with PCP. Stable for discharge. Return precautions given. Patient states understanding of treatment care plan and is agreeable.  I personally performed the services described in this documentation, which was scribed in my presence. The recorded information has been reviewed and is accurate.   Carman Ching, PA-C 04/18/14 [place], MD 04/19/14 1239

## 2014-04-18 NOTE — ED Notes (Signed)
Declined W/C at D/C and was escorted to lobby by RN. 

## 2014-06-20 ENCOUNTER — Emergency Department (HOSPITAL_COMMUNITY)
Admission: EM | Admit: 2014-06-20 | Discharge: 2014-06-20 | Disposition: A | Discharge status: Home / Self Care | Payer: Self-pay | Attending: Emergency Medicine | Admitting: Emergency Medicine

## 2014-06-20 ENCOUNTER — Encounter (HOSPITAL_COMMUNITY): Payer: Self-pay

## 2014-06-20 DIAGNOSIS — Z79899 Other long term (current) drug therapy: Secondary | ICD-10-CM | POA: Insufficient documentation

## 2014-06-20 DIAGNOSIS — Z791 Long term (current) use of non-steroidal anti-inflammatories (NSAID): Secondary | ICD-10-CM | POA: Insufficient documentation

## 2014-06-20 DIAGNOSIS — M62830 Muscle spasm of back: Secondary | ICD-10-CM | POA: Insufficient documentation

## 2014-06-20 DIAGNOSIS — Z720 Tobacco use: Secondary | ICD-10-CM | POA: Insufficient documentation

## 2014-06-20 LAB — BASIC METABOLIC PANEL
Anion gap: 12 (ref 5–15)
BUN: 7 mg/dL (ref 6–20)
CO2: 24 mmol/L (ref 22–32)
Calcium: 9.2 mg/dL (ref 8.9–10.3)
Chloride: 101 mmol/L (ref 101–111)
Creatinine, Ser: 1.04 mg/dL (ref 0.61–1.24)
GFR calc Af Amer: >60 mL/min (ref >60)
Glucose, Bld: 123 mg/dL — ABNORMAL HIGH (ref 65–99)
Potassium: 3.7 mmol/L (ref 3.5–5.1)
Sodium: 137 mmol/L (ref 135–145)

## 2014-06-20 LAB — CBC WITH DIFFERENTIAL/PLATELET
BASOS ABS: 0.1 10*3/uL (ref 0.0–0.1)
BASOS PCT: 1 % (ref 0–1)
EOS ABS: 0.3 10*3/uL (ref 0.0–0.7)
Eosinophils Relative: 4 % (ref 0–5)
HCT: 44.2 % (ref 39.0–52.0)
HEMOGLOBIN: 15.9 g/dL (ref 13.0–17.0)
Lymphocytes Relative: 37 % (ref 12–46)
Lymphs Abs: 3.2 10*3/uL (ref 0.7–4.0)
MCH: 33.2 pg (ref 26.0–34.0)
MCHC: 36 g/dL (ref 30.0–36.0)
MCV: 92.3 fL (ref 78.0–100.0)
MONO ABS: 0.8 10*3/uL (ref 0.1–1.0)
MONOS PCT: 9 % (ref 3–12)
Neutro Abs: 4.3 10*3/uL (ref 1.7–7.7)
Neutrophils Relative %: 49 % (ref 43–77)
Platelets: 341 10*3/uL (ref 150–400)
RBC: 4.79 MIL/uL (ref 4.22–5.81)
RDW: 12.6 % (ref 11.5–15.5)
WBC: 8.6 10*3/uL (ref 4.0–10.5)

## 2014-06-20 MED ORDER — OXYCODONE-ACETAMINOPHEN 5-325 MG PO TABS: 2.0000 | ORAL_TABLET | ORAL | Status: DC | PRN

## 2014-06-20 MED ORDER — SODIUM CHLORIDE 0.9 % IV BOLUS (SEPSIS)
1000.0000 mL | Freq: Once | INTRAVENOUS | Status: AC
  Administered 2014-06-20: 1000 mL via INTRAVENOUS

## 2014-06-20 MED ORDER — DIAZEPAM 5 MG/ML IJ SOLN
5.0000 mg | Freq: Once | INTRAMUSCULAR | Status: AC
  Administered 2014-06-20: 5 mg via INTRAVENOUS
  Filled 2014-06-20: qty 2

## 2014-06-20 MED ORDER — OXYCODONE-ACETAMINOPHEN 5-325 MG PO TABS
2.0000 | ORAL_TABLET | Freq: Once | ORAL | Status: AC
  Administered 2014-06-20: 2 via ORAL
  Filled 2014-06-20: qty 2

## 2014-06-20 MED ORDER — CYCLOBENZAPRINE HCL 10 MG PO TABS: 10.0000 mg | ORAL_TABLET | Freq: Two times a day (BID) | ORAL | Status: DC | PRN

## 2014-06-20 MED ORDER — KETOROLAC TROMETHAMINE 30 MG/ML IJ SOLN
30.0000 mg | Freq: Once | INTRAMUSCULAR | Status: AC
  Administered 2014-06-20: 30 mg via INTRAVENOUS
  Filled 2014-06-20: qty 1

## 2014-06-20 NOTE — ED Notes (Signed)
Pt from home for eval of intermittent back pain, states seen for the same and was told to try ibuprofen which gave minimal relief but now gives no relief. Pt also reports generalized muscle cramps all over and states this morning his body "locked up." nad noted, pt denies any cp or sob at this time.

## 2014-06-20 NOTE — Discharge Instructions (Signed)
Take Percocet as needed for pain. Take Flexeril as needed for muscle spasm. You may take these medications together. Apply heat to affected area.

## 2014-06-20 NOTE — ED Notes (Signed)
Was getting up and his back locked up and his left leg hurts. Cramps came up through left side of chest. Tries midol to see if it helps and sometimes it does. 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

## 2014-06-20 NOTE — ED Provider Notes (Signed)
CSN: [id number]     Arrival date & time 06/20/14  1408 History   First MD Initiated Contact with Patient 06/20/14 1504     Chief Complaint  Patient presents with  . Back Pain  . Leg Pain     (Consider location/radiation/quality/duration/timing/severity/associated sxs/prior Treatment) HPI Comments: Patient is a 44 year old male with no past medical history who presents with back pain that started this morning. Patient reports he was getting out of bed when the his left lower back "locked up." The pain is aching and severe and radiates down his left leg. The pain is constant. Movement makes the pain worse. Nothing makes the pain better. Patient tried taking Midol for pain without relief. No other associated symptoms. No saddle paresthesias or bladder/bowel incontinence.      History reviewed. No pertinent past medical history. History reviewed. No pertinent past surgical history. Family History  Problem Relation Age of Onset  . Heart disease Father    History  Substance Use Topics  . Smoking status: Current Every Day Smoker  . Smokeless tobacco: Not on file  . Alcohol Use: Yes    Review of Systems  Constitutional: Negative for fever, chills and fatigue.  HENT: Negative for trouble swallowing.   Eyes: Negative for visual disturbance.  Respiratory: Negative for shortness of breath.   Cardiovascular: Negative for chest pain and palpitations.  Gastrointestinal: Negative for nausea, vomiting, abdominal pain and diarrhea.  Genitourinary: Negative for dysuria and difficulty urinating.  Musculoskeletal: Positive for back pain. Negative for arthralgias and neck pain.  Skin: Negative for color change.  Neurological: Negative for dizziness and weakness.  Psychiatric/Behavioral: Negative for dysphoric mood.      Allergies  Review of patient's allergies indicates no known allergies.  Home Medications   Prior to Admission medications   Medication Sig Start Date End Date Taking?  Authorizing Provider  cyclobenzaprine (FLEXERIL) 10 MG tablet Take 1 tablet (10 mg total) by mouth 3 (three) times daily as needed for muscle spasms. 08/25/13   Tresa Garter, MD  ibuprofen (ADVIL,MOTRIN) 800 MG tablet Take 1 tablet (800 mg total) by mouth every 8 (eight) hours as needed for headache. 08/25/13   Tresa Garter, MD  naproxen (NAPROSYN) 500 MG tablet Take 1 tablet (500 mg total) by mouth 2 (two) times daily with a meal. As needed for back pain 11/27/13   Audelia Hives Presson, PA  simvastatin (ZOCOR) 10 MG tablet Take 1 tablet (10 mg total) by mouth at bedtime. 09/03/13   Tresa Garter, MD   BP 128/88 mmHg  Pulse 79  Temp(Src) 98 F (36.7 C) (Oral)  Resp 15  SpO2 94% Physical Exam  Constitutional: He is oriented to person, place, and time. He appears well-developed and well-nourished. No distress.  HENT:  Head: Normocephalic and atraumatic.  Eyes: Conjunctivae and EOM are normal.  Neck: Normal range of motion.  Cardiovascular: Normal rate and regular rhythm.  Exam reveals no gallop and no friction rub.   No murmur heard. Pulmonary/Chest: Effort normal and breath sounds normal. He has no wheezes. He has no rales. He exhibits no tenderness.  Abdominal: Soft. He exhibits no distension. There is no tenderness. There is no rebound.  Musculoskeletal: Normal range of motion.  No midline spine tenderness to palpation. Left lumbar paraspinal tenderness to palpation. Left leg straight leg raise positive.   Neurological: He is alert and oriented to person, place, and time. Coordination normal.  Extremity strength and sensation equal and intact  bilaterally. Speech is goal-oriented. Moves limbs without ataxia.   Skin: Skin is warm and dry.  Psychiatric: He has a normal mood and affect. His behavior is normal.  Nursing note and vitals reviewed.   ED Course  Procedures (including critical care time) Labs Review Labs Reviewed  BASIC METABOLIC PANEL - Abnormal; Notable  for the following:    Glucose, Bld 123 (*)    All other components within normal limits  CBC WITH DIFFERENTIAL/PLATELET    Imaging Review No results found.   EKG Interpretation None      MDM   Final diagnoses:  Back muscle spasm    3:11 PM Labs pending. No neurovascular compromise. No bladder/bowel incontinence or saddle paresthesias. Patient will have toradol and valium for pain.   Alvina Chou, PA-C 06/22/14 4888  Wandra Arthurs, MD 06/22/14 0700

## 2014-07-17 ENCOUNTER — Encounter (HOSPITAL_COMMUNITY): Payer: Self-pay | Admitting: Family Medicine

## 2014-07-17 ENCOUNTER — Emergency Department (HOSPITAL_COMMUNITY): Payer: Self-pay

## 2014-07-17 ENCOUNTER — Emergency Department (HOSPITAL_COMMUNITY)
Admission: EM | Admit: 2014-07-17 | Discharge: 2014-07-17 | Disposition: A | Discharge status: Home / Self Care | Payer: Self-pay | Attending: Emergency Medicine | Admitting: Emergency Medicine

## 2014-07-17 DIAGNOSIS — K4090 Unilateral inguinal hernia, without obstruction or gangrene, not specified as recurrent: Secondary | ICD-10-CM | POA: Insufficient documentation

## 2014-07-17 DIAGNOSIS — N390 Urinary tract infection, site not specified: Secondary | ICD-10-CM | POA: Insufficient documentation

## 2014-07-17 DIAGNOSIS — R109 Unspecified abdominal pain: Secondary | ICD-10-CM

## 2014-07-17 DIAGNOSIS — Z720 Tobacco use: Secondary | ICD-10-CM | POA: Insufficient documentation

## 2014-07-17 DIAGNOSIS — N23 Unspecified renal colic: Secondary | ICD-10-CM | POA: Insufficient documentation

## 2014-07-17 DIAGNOSIS — R220 Localized swelling, mass and lump, head: Secondary | ICD-10-CM

## 2014-07-17 LAB — URINALYSIS, ROUTINE W REFLEX MICROSCOPIC
BILIRUBIN URINE: NEGATIVE
Glucose, UA: NEGATIVE mg/dL
KETONES UR: NEGATIVE mg/dL
NITRITE: NEGATIVE
PH: 5 (ref 5.0–8.0)
Protein, ur: 30 mg/dL — AB
SPECIFIC GRAVITY, URINE: 1.008 (ref 1.005–1.030)
UROBILINOGEN UA: 0.2 mg/dL (ref 0.0–1.0)

## 2014-07-17 LAB — COMPREHENSIVE METABOLIC PANEL
ALT: 17 U/L (ref 17–63)
AST: 25 U/L (ref 15–41)
Albumin: 3.8 g/dL (ref 3.5–5.0)
Alkaline Phosphatase: 68 U/L (ref 38–126)
Anion gap: 9 (ref 5–15)
BILIRUBIN TOTAL: 0.6 mg/dL (ref 0.3–1.2)
BUN: 13 mg/dL (ref 6–20)
CO2: 22 mmol/L (ref 22–32)
CREATININE: 1.1 mg/dL (ref 0.61–1.24)
Calcium: 9 mg/dL (ref 8.9–10.3)
Chloride: 106 mmol/L (ref 101–111)
GFR calc Af Amer: >60 mL/min (ref >60)
GFR calc non Af Amer: >60 mL/min (ref >60)
Glucose, Bld: 88 mg/dL (ref 65–99)
Potassium: 4.1 mmol/L (ref 3.5–5.1)
Sodium: 137 mmol/L (ref 135–145)
Total Protein: 6.8 g/dL (ref 6.5–8.1)

## 2014-07-17 LAB — CBC WITH DIFFERENTIAL/PLATELET
BASOS ABS: 0.1 10*3/uL (ref 0.0–0.1)
BASOS PCT: 1 % (ref 0–1)
EOS PCT: 3 % (ref 0–5)
Eosinophils Absolute: 0.3 10*3/uL (ref 0.0–0.7)
HCT: 41.7 % (ref 39.0–52.0)
HEMOGLOBIN: 14.8 g/dL (ref 13.0–17.0)
Lymphocytes Relative: 35 % (ref 12–46)
Lymphs Abs: 3.2 10*3/uL (ref 0.7–4.0)
MCH: 32.9 pg (ref 26.0–34.0)
MCHC: 35.5 g/dL (ref 30.0–36.0)
MCV: 92.7 fL (ref 78.0–100.0)
Monocytes Absolute: 0.9 10*3/uL (ref 0.1–1.0)
Monocytes Relative: 10 % (ref 3–12)
NEUTROS PCT: 51 % (ref 43–77)
Neutro Abs: 4.6 10*3/uL (ref 1.7–7.7)
PLATELETS: 377 10*3/uL (ref 150–400)
RBC: 4.5 MIL/uL (ref 4.22–5.81)
RDW: 13.1 % (ref 11.5–15.5)
WBC: 9.1 10*3/uL (ref 4.0–10.5)

## 2014-07-17 LAB — URINE MICROSCOPIC-ADD ON

## 2014-07-17 MED ORDER — OXYCODONE-ACETAMINOPHEN 5-325 MG PO TABS: 1.0000 | ORAL_TABLET | ORAL | Status: DC | PRN

## 2014-07-17 MED ORDER — KETOROLAC TROMETHAMINE 30 MG/ML IJ SOLN
30.0000 mg | Freq: Once | INTRAMUSCULAR | Status: AC
  Administered 2014-07-17: 30 mg via INTRAVENOUS
  Filled 2014-07-17: qty 1

## 2014-07-17 MED ORDER — DEXTROSE 5 % IV SOLN
1.0000 g | Freq: Once | INTRAVENOUS | Status: AC
  Administered 2014-07-17: 1 g via INTRAVENOUS
  Filled 2014-07-17: qty 10

## 2014-07-17 MED ORDER — CIPROFLOXACIN HCL 500 MG PO TABS: 500.0000 mg | ORAL_TABLET | Freq: Two times a day (BID) | ORAL | Status: DC

## 2014-07-17 MED ORDER — SODIUM CHLORIDE 0.9 % IV BOLUS (SEPSIS)
1000.0000 mL | Freq: Once | INTRAVENOUS | Status: AC
  Administered 2014-07-17: 1000 mL via INTRAVENOUS

## 2014-07-17 MED ORDER — MORPHINE SULFATE 4 MG/ML IJ SOLN
4.0000 mg | Freq: Once | INTRAMUSCULAR | Status: AC
  Administered 2014-07-17: 4 mg via INTRAVENOUS
  Filled 2014-07-17: qty 1

## 2014-07-17 MED ORDER — IOHEXOL 300 MG/ML  SOLN: 100.0000 mL | Freq: Once | INTRAMUSCULAR | Status: DC | PRN

## 2014-07-17 MED ORDER — IBUPROFEN 800 MG PO TABS: 800.0000 mg | ORAL_TABLET | Freq: Three times a day (TID) | ORAL | Status: DC | PRN

## 2014-07-17 NOTE — Discharge Instructions (Signed)
Take motrin for pain.   Take percocet for severe pain. Do NOT drive with it.  Rest for several days.   Take cipro for 10 days.   Follow up with urologist for kidney stone. Your stone is large and may need a procedure.   See surgery for inguinal hernia.  Return to ER if you have severe pain, vomiting, fever.

## 2014-07-17 NOTE — ED Notes (Signed)
Patient transported to ct

## 2014-07-17 NOTE — ED Provider Notes (Signed)
CSN: [id number]     Arrival date & time 07/17/14  1303 History   First MD Initiated Contact with Patient 07/17/14 1714     Chief Complaint  Patient presents with  . Hernia  . Groin Pain     (Consider location/radiation/quality/duration/timing/severity/associated sxs/prior Treatment) The history is provided by the patient.  [name] is a 44 y.o. male here presenting with lower abdominal pain. States that he has a history of inguinal hernia. He works long shifts lifting heavy Geneticist, molecular. For the last 2 days noticed sharp right lower quadrant pain. Since yesterday also noticed that his left inguinal hernia popped out and he is in more pain afterwards. Denies any vomiting or fevers. He also noticed some blood in his urine but denies any dysuria.    History reviewed. No pertinent past medical history. History reviewed. No pertinent past surgical history. Family History  Problem Relation Age of Onset  . Heart disease Father    History  Substance Use Topics  . Smoking status: Current Every Day Smoker  . Smokeless tobacco: Not on file  . Alcohol Use: Yes    Review of Systems  Gastrointestinal: Positive for abdominal pain.  Genitourinary: Positive for hematuria.  All other systems reviewed and are negative.     Allergies  Review of patient's allergies indicates no known allergies.  Home Medications   Prior to Admission medications   Medication Sig Start Date End Date Taking? Authorizing Provider  cyclobenzaprine (FLEXERIL) 10 MG tablet Take 1 tablet (10 mg total) by mouth 2 (two) times daily as needed for muscle spasms. 06/20/14   Kaitlyn Szekalski, PA-C  ibuprofen (ADVIL,MOTRIN) 800 MG tablet Take 1 tablet (800 mg total) by mouth every 8 (eight) hours as needed for headache. 08/25/13   Tresa Garter, MD  naproxen (NAPROSYN) 500 MG tablet Take 1 tablet (500 mg total) by mouth 2 (two) times daily with a meal. As needed for back pain 11/27/13   Lutricia Feil, PA   oxyCODONE-acetaminophen (PERCOCET/ROXICET) 5-325 MG per tablet Take 2 tablets by mouth every 4 (four) hours as needed for severe pain. 06/20/14   Kaitlyn Szekalski, PA-C  simvastatin (ZOCOR) 10 MG tablet Take 1 tablet (10 mg total) by mouth at bedtime. 09/03/13   Tresa Garter, MD   BP 136/76 mmHg  Pulse 64  Temp(Src) 97.8 F (36.6 C) (Oral)  Resp 16  SpO2 99% Physical Exam  Constitutional: He is oriented to person, place, and time.  Uncomfortable   HENT:  Head: Normocephalic.  Mouth/Throat: Oropharynx is clear and moist.  Eyes: Conjunctivae are normal. Pupils are equal, round, and reactive to light.  Neck: Normal range of motion. Neck supple.  Cardiovascular: Normal rate, regular rhythm and normal heart sounds.   Pulmonary/Chest: Effort normal and breath sounds normal. No respiratory distress. He has no wheezes. He has no rales.  Abdominal: Soft. Bowel sounds are normal.  Mild RLQ tenderness and R CVAT. L inguinal hernia is reducible, minimally tender   Genitourinary:  Testicles nontender   Musculoskeletal: Normal range of motion. He exhibits no edema or tenderness.  Neurological: He is alert and oriented to person, place, and time.  Skin: Skin is warm and dry.  Psychiatric: He has a normal mood and affect. His behavior is normal. Thought content normal.  Nursing note and vitals reviewed.   ED Course  Procedures (including critical care time) Labs Review Labs Reviewed  URINALYSIS, ROUTINE W REFLEX MICROSCOPIC (NOT AT [facility]) - Abnormal; Notable  for the following:    APPearance CLOUDY (*)    Hgb urine dipstick LARGE (*)    Protein, ur 30 (*)    Leukocytes, UA MODERATE (*)    All other components within normal limits  URINE MICROSCOPIC-ADD ON  CBC WITH DIFFERENTIAL/PLATELET  COMPREHENSIVE METABOLIC PANEL    Imaging Review Ct Abdomen Pelvis Wo Contrast  07/17/2014   CLINICAL DATA:  44 year old male with right flank and abdominal pain plus left groin pain. Possible  lifting injury. Initial encounter.  EXAM: CT ABDOMEN AND PELVIS WITHOUT CONTRAST  TECHNIQUE: Multidetector CT imaging of the abdomen and pelvis was performed following the standard protocol without IV contrast.  COMPARISON:  [facility] CT Abdomen and Pelvis 01/22/2007. Lumbar spine radiographs 09/23/2011.  FINDINGS: Negative lung bases. Borderline to mild cardiomegaly. No pericardial or pleural effusion.  Lower lumbar disc and endplate degeneration with endplate spurring. No acute osseous abnormality identified.  Small fat containing left inguinal hernia has mildly enlarged since 2008. Bilateral inguinal lymph nodes appear within normal limits.  No pelvic free fluid. Negative distal colon. Negative left colon. Negative right colon and appendix. No dilated small bowel. Decompressed stomach and duodenum.  Negative noncontrast liver, gallbladder, spleen, pancreas and adrenal glands. No abdominal free fluid.  Negative non contrast left kidney and left ureter. Diminutive bladder. Barnabas Lister stone type right midpole nephrolithiasis measuring 10-11 mm in diameter. In 2008 there was a midpole calculus measuring 3-4 mm. Chronic right extra renal pelvis, but there is subtle inflammation about the right renal pelvis (series 2, image 39). Mild proximal right hydroureter also with stranding (image 44). The right ureter then tapers and is nondilated along the remainder of its course. The bladder is diminutive and unremarkable. No ureteral calculus.  IMPRESSION: 1. Jackstone calculus of the right kidney, 11 mm diameter and new or enlarged since 2008. 2. No ureteral calculus or other urologic calculus, however, the right renal pelvis and proximal ureter do appear acutely inflamed. Consider acute upper tract urinary infection. 3. Small fat containing left inguinal hernia with no acute features. 4. Otherwise negative noncontrast abdomen and pelvis.   Electronically Signed   By: Genevie Ann M.D.   On: 07/17/2014 18:31      EKG Interpretation None      MDM   Final diagnoses:  Right flank pain    [name] is a 44 y.o. male here with ab pain, flank pain. Consider renal colic vs pain from hernia, less likely appy. Will get labs, renal stone CT, UA.   8:13 PM UA + blood and leuks. CT showed large 11 mm R kidney stone and small L inguinal hernia. WBC nl. Temp nl. Doesn't appear septic. Given ceftriaxone. Will dc home with cipro, percocet, urology and surgery f/u.     Wandra Arthurs, MD 07/17/14 2013

## 2014-07-17 NOTE — ED Notes (Signed)
Pt here for left groin pain and right lower abd pain. sts burning. 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 Hx of hernia.

## 2014-09-12 ENCOUNTER — Emergency Department (HOSPITAL_COMMUNITY)
Admission: EM | Admit: 2014-09-12 | Discharge: 2014-09-12 | Disposition: A | Discharge status: Home / Self Care | Payer: Self-pay | Attending: Emergency Medicine | Admitting: Emergency Medicine

## 2014-09-12 ENCOUNTER — Encounter (HOSPITAL_COMMUNITY): Payer: Self-pay | Admitting: *Deleted

## 2014-09-12 DIAGNOSIS — M545 Low back pain, unspecified: Secondary | ICD-10-CM

## 2014-09-12 DIAGNOSIS — Z792 Long term (current) use of antibiotics: Secondary | ICD-10-CM | POA: Insufficient documentation

## 2014-09-12 DIAGNOSIS — Z720 Tobacco use: Secondary | ICD-10-CM | POA: Insufficient documentation

## 2014-09-12 MED ORDER — NAPROXEN 500 MG PO TABS: 500.0000 mg | ORAL_TABLET | Freq: Two times a day (BID) | ORAL | Status: DC

## 2014-09-12 MED ORDER — METHOCARBAMOL 500 MG PO TABS: 500.0000 mg | ORAL_TABLET | Freq: Two times a day (BID) | ORAL | Status: DC

## 2014-09-12 NOTE — ED Notes (Signed)
PT reports back pain started this AM

## 2014-09-12 NOTE — ED Notes (Signed)
Declined W/C at D/C and was escorted to lobby by RN. 

## 2014-09-12 NOTE — Discharge Instructions (Signed)
Back Exercises Follow-up with your primary care provider. Return for any weakness or numbness in your lower extremities, bowel or bladder incontinence or retention. Back exercises help treat and prevent back injuries. The goal of back exercises is to increase the strength of your abdominal and back muscles and the flexibility of your back. These exercises should be started when you no longer have back pain. Back exercises include:  Pelvic Tilt. Lie on your back with your knees bent. Tilt your pelvis until the lower part of your back is against the floor. Hold this position 5 to 10 sec and repeat 5 to 10 times.  Knee to Chest. Pull first 1 knee up against your chest and hold for 20 to 30 seconds, repeat this with the other knee, and then both knees. This may be done with the other leg straight or bent, whichever feels better.  Sit-Ups or Curl-Ups. Bend your knees 90 degrees. Start with tilting your pelvis, and do a partial, slow sit-up, lifting your trunk only 30 to 45 degrees off the floor. Take at least 2 to 3 seconds for each sit-up. Do not do sit-ups with your knees out straight. If partial sit-ups are difficult, simply do the above but with only tightening your abdominal muscles and holding it as directed.  Hip-Lift. Lie on your back with your knees flexed 90 degrees. Push down with your feet and shoulders as you raise your hips a couple inches off the floor; hold for 10 seconds, repeat 5 to 10 times.  Back arches. Lie on your stomach, propping yourself up on bent elbows. Slowly press on your hands, causing an arch in your low back. Repeat 3 to 5 times. Any initial stiffness and discomfort should lessen with repetition over time.  Shoulder-Lifts. Lie face down with arms beside your body. Keep hips and torso pressed to floor as you slowly lift your head and shoulders off the floor. Do not overdo your exercises, especially in the beginning. Exercises may cause you some mild back discomfort which  lasts for a few minutes; however, if the pain is more severe, or lasts for more than 15 minutes, do not continue exercises until you see your caregiver. Improvement with exercise therapy for back problems is slow.  See your caregivers for assistance with developing a proper back exercise program. Document Released: 02/24/2004 Document Revised: 04/10/2011 Document Reviewed: 11/17/2010 [facility] Patient Information 2015 [place]. This information is not intended to replace advice given to you by your health care provider. Make sure you discuss any questions you have with your health care provider.

## 2014-09-12 NOTE — ED Provider Notes (Signed)
History  This chart was scribed for non-physician practitioner, Ottie Glazier, PA-C,working with Lajean Saver, MD, by Marlowe Kays, ED Scribe. This patient was seen in room TR06C/TR06C and the patient's care was started at 1:48 PM.  Chief Complaint  Patient presents with  . Back Pain   HPI  HPI Comments:  [name] is a 44 y.o. male who presents to the Emergency Department complaining of lower left-sided back pain that began approximately two weeks ago. He states when he tried to get out of bed he could not stand up completely. He has not taken anything for the pain. He states he was taking Ibuprofen with significant relief of the pain but has not taken any in the past four days. He denies modifying factors. He denies IV drug use or h/o cancer. Denies any recent steroid use. He denies numbness, tingling or weakness of the lower extremities, bowel or bladder incontinence, saddle anesthesia, fever, chills, nausea, vomiting, bruising or wounds. He denies any known trauma, injury or fall. 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   History reviewed. No pertinent past medical history. History reviewed. No pertinent past surgical history. Family History  Problem Relation Age of Onset  . Heart disease Father    Social History  Substance Use Topics  . Smoking status: Current Every Day Smoker  . Smokeless tobacco: None  . Alcohol Use: Yes    Review of Systems  Constitutional: Negative for fever and chills.  Gastrointestinal: Negative for nausea and vomiting.  Genitourinary:       No bowel or bladder incontinence  Musculoskeletal: Positive for back pain.  Skin: Negative for color change and wound.  Neurological: Negative for weakness and numbness.    Allergies  Review of patient's allergies indicates no known allergies.  Home Medications   Prior to Admission medications   Medication Sig Start Date End Date Taking? Authorizing  Provider  ciprofloxacin (CIPRO) 500 MG tablet Take 1 tablet (500 mg total) by mouth 2 (two) times daily. 07/17/14   Wandra Arthurs, MD  cyclobenzaprine (FLEXERIL) 10 MG tablet Take 1 tablet (10 mg total) by mouth 2 (two) times daily as needed for muscle spasms. 06/20/14   Kaitlyn Szekalski, PA-C  ibuprofen (ADVIL,MOTRIN) 800 MG tablet Take 1 tablet (800 mg total) by mouth every 8 (eight) hours as needed for headache. 07/17/14   Wandra Arthurs, MD  methocarbamol (ROBAXIN) 500 MG tablet Take 1 tablet (500 mg total) by mouth 2 (two) times daily. 09/12/14   Denecia Brunette Patel-Mills, PA-C  naproxen (NAPROSYN) 500 MG tablet Take 1 tablet (500 mg total) by mouth 2 (two) times daily. 09/12/14   Alma Muegge Patel-Mills, PA-C  oxyCODONE-acetaminophen (PERCOCET/ROXICET) 5-325 MG per tablet Take 1-2 tablets by mouth every 4 (four) hours as needed for severe pain. 07/17/14   Wandra Arthurs, MD  simvastatin (ZOCOR) 10 MG tablet Take 1 tablet (10 mg total) by mouth at bedtime. 09/03/13   Tresa Garter, MD   BP 141/80 mmHg  Pulse 78  Temp(Src) 98.3 F (36.8 C) (Oral)  Resp 16  SpO2 100% Physical Exam  Constitutional: He is oriented to person, place, and time. He appears well-developed and well-nourished.  HENT:  Head: Normocephalic and atraumatic.  Eyes: Conjunctivae and EOM are normal.  Neck: Normal range of motion.  Cardiovascular: Normal rate.   Pulmonary/Chest: Effort normal. No respiratory distress.  Musculoskeletal: Normal range of motion.  No midline or paravertebral lumbar tenderness.  Negative  straight leg raise.  Ambulatory with steady gait.  No saddle anesthesia. Able to dorsi and plantar flex wirhout difficulty.   Neurological: He is alert and oriented to person, place, and time.  Skin: Skin is warm and dry.  Psychiatric: He has a normal mood and affect. His behavior is normal.  Nursing note and vitals reviewed.   ED Course  Procedures (including critical care time) DIAGNOSTIC STUDIES:   COORDINATION OF  CARE: 1:53 PM- Will prescribe Naproxen and muscle relaxer. Will provide work note at pt's request. Pt verbalizes understanding and agrees to plan.  Medications - No data to display  Labs Review Labs Reviewed - No data to display  Imaging Review No results found.    EKG Interpretation None      MDM   Final diagnoses:  Left-sided low back pain without sciatica   He has no signs of cord compression or cauda equina symptoms. I gave him naproxen and robaxin. I gave him return precautions and he verbally agrees with the plan.  Medications - No data to display I personally performed the services described in this documentation, which was scribed in my presence. The recorded information has been reviewed and is accurate.    Ottie Glazier, PA-C 09/12/14 [place], MD 09/13/14 [phone]

## 2014-10-10 ENCOUNTER — Encounter (HOSPITAL_COMMUNITY): Payer: Self-pay | Admitting: *Deleted

## 2014-10-10 ENCOUNTER — Emergency Department (HOSPITAL_COMMUNITY)
Admission: EM | Admit: 2014-10-10 | Discharge: 2014-10-10 | Disposition: A | Discharge status: Home / Self Care | Payer: Self-pay | Attending: Emergency Medicine | Admitting: Emergency Medicine

## 2014-10-10 DIAGNOSIS — Y929 Unspecified place or not applicable: Secondary | ICD-10-CM | POA: Insufficient documentation

## 2014-10-10 DIAGNOSIS — Z792 Long term (current) use of antibiotics: Secondary | ICD-10-CM | POA: Insufficient documentation

## 2014-10-10 DIAGNOSIS — R05 Cough: Secondary | ICD-10-CM | POA: Insufficient documentation

## 2014-10-10 DIAGNOSIS — Z791 Long term (current) use of non-steroidal anti-inflammatories (NSAID): Secondary | ICD-10-CM | POA: Insufficient documentation

## 2014-10-10 DIAGNOSIS — Y9389 Activity, other specified: Secondary | ICD-10-CM | POA: Insufficient documentation

## 2014-10-10 DIAGNOSIS — S39011A Strain of muscle, fascia and tendon of abdomen, initial encounter: Secondary | ICD-10-CM | POA: Insufficient documentation

## 2014-10-10 DIAGNOSIS — Y998 Other external cause status: Secondary | ICD-10-CM | POA: Insufficient documentation

## 2014-10-10 DIAGNOSIS — X58XXXA Exposure to other specified factors, initial encounter: Secondary | ICD-10-CM | POA: Insufficient documentation

## 2014-10-10 DIAGNOSIS — K4090 Unilateral inguinal hernia, without obstruction or gangrene, not specified as recurrent: Secondary | ICD-10-CM | POA: Insufficient documentation

## 2014-10-10 DIAGNOSIS — Z720 Tobacco use: Secondary | ICD-10-CM | POA: Insufficient documentation

## 2014-10-10 MED ORDER — IBUPROFEN 600 MG PO TABS: 600.0000 mg | ORAL_TABLET | Freq: Four times a day (QID) | ORAL | Status: DC | PRN

## 2014-10-10 MED ORDER — KETOROLAC TROMETHAMINE 30 MG/ML IJ SOLN
60.0000 mg | Freq: Once | INTRAMUSCULAR | Status: AC
  Administered 2014-10-10: 60 mg via INTRAMUSCULAR
  Filled 2014-10-10: qty 2

## 2014-10-10 NOTE — ED Notes (Signed)
No S/S of reaction to toradol injection.

## 2014-10-10 NOTE — ED Notes (Signed)
The pt has had a cough  And cold for 2-3 days.  He did not go to work tioday.  He is also c/o  Muscle  pain

## 2014-10-10 NOTE — ED Provider Notes (Signed)
CSN: [id number]     Arrival date & time 10/10/14  1904 History   First MD Initiated Contact with Patient 10/10/14 1927     Chief Complaint  Patient presents with  . Cough  . Abdominal Pain     (Consider location/radiation/quality/duration/timing/severity/associated sxs/prior Treatment) HPI   Pt states he was coughing this morning and it caused his left inguinal hernia to pop out more than usual and hurt more than usual.  Notes it was the size of a golfball.  He has also noticed some soreness in his left abdomen for the last two days, worse with doing the repeptitive heavy lifting and twisting that he does at work.  Denies fevers, chills, N/V, change in stools, urinary symptoms, testicular pain or swelling.  Last BM was this morning and was normal.  He is able to pass flatus.  Denies any bloody stools, constipation, diarrhea.  Pt states he cough is his baseline smoker's cough and he denies any change in his baseline cough.  Denies SOB, CP, hemoptysis, any sputum.    History reviewed. No pertinent past medical history. History reviewed. No pertinent past surgical history. Family History  Problem Relation Age of Onset  . Heart disease Father    Social History  Substance Use Topics  . Smoking status: Current Every Day Smoker  . Smokeless tobacco: None  . Alcohol Use: Yes    Review of Systems  All other systems reviewed and are negative.     Allergies  Review of patient's allergies indicates no known allergies.  Home Medications   Prior to Admission medications   Medication Sig Start Date End Date Taking? Authorizing Provider  ciprofloxacin (CIPRO) 500 MG tablet Take 1 tablet (500 mg total) by mouth 2 (two) times daily. 07/17/14   Wandra Arthurs, MD  cyclobenzaprine (FLEXERIL) 10 MG tablet Take 1 tablet (10 mg total) by mouth 2 (two) times daily as needed for muscle spasms. 06/20/14   Kaitlyn Szekalski, PA-C  ibuprofen (ADVIL,MOTRIN) 800 MG tablet Take 1 tablet (800 mg total) by  mouth every 8 (eight) hours as needed for headache. 07/17/14   Wandra Arthurs, MD  methocarbamol (ROBAXIN) 500 MG tablet Take 1 tablet (500 mg total) by mouth 2 (two) times daily. 09/12/14   Hanna Patel-Mills, PA-C  naproxen (NAPROSYN) 500 MG tablet Take 1 tablet (500 mg total) by mouth 2 (two) times daily. 09/12/14   Hanna Patel-Mills, PA-C  oxyCODONE-acetaminophen (PERCOCET/ROXICET) 5-325 MG per tablet Take 1-2 tablets by mouth every 4 (four) hours as needed for severe pain. 07/17/14   Wandra Arthurs, MD  simvastatin (ZOCOR) 10 MG tablet Take 1 tablet (10 mg total) by mouth at bedtime. 09/03/13   Tresa Garter, MD   BP 124/69 mmHg  Pulse 86  Temp(Src) 97.8 F (36.6 C)  Resp 18  Ht 5\' 11"  (1.803 m)  Wt 215 lb 9 oz (97.779 kg)  BMI 30.08 kg/m2  SpO2 96% Physical Exam  Constitutional: He appears well-developed and well-nourished. No distress.  HENT:  Head: Normocephalic and atraumatic.  Neck: Neck supple.  Cardiovascular: Normal rate and regular rhythm.   Pulmonary/Chest: Effort normal and breath sounds normal. No respiratory distress. He has no wheezes. He has no rales.  Abdominal: Soft. He exhibits no distension and no mass. There is tenderness in the left lower quadrant. There is no rebound and no guarding. A hernia is present. Hernia confirmed positive in the left inguinal area.  Left direct inguinal hernia only palpable with coughing.  Reduces itself easily while pt lying supine.  No overlying discoloration.    Neurological: He is alert. He exhibits normal muscle tone.  Skin: He is not diaphoretic.  Nursing note and vitals reviewed.   ED Course  Procedures (including critical care time) Labs Review Labs Reviewed - No data to display  Imaging Review No results found. I have personally reviewed and evaluated these images and lab results as part of my medical decision-making.   EKG Interpretation None      8:38 PM Reexamined abdomen, completely benign.  Soft, nondistended,  nontender, no guarding or rebound.    MDM   Final diagnoses:  Left inguinal hernia  Abdominal muscle strain, initial encounter    Afebrile, nontoxic patient with known left inguinal hernia causing more pain than usual today, easily reducible.  Also with left sided abdominal muscle pain, only hurts with movement and use.  No associated symptoms.  Pt treated with toradol with improvement.  Repeat abdominal exam benign.  Considered but doubt ureteral colic, UTI, diverticulitis.  No e/o strangulated/incarcerated hernia.   D/C home with ibuprofen. General surgery referral.  Discussed result, findings, treatment, and follow up  with patient.  Pt given return precautions.  Pt verbalizes understanding and agrees with plan.         Clayton Bibles, PA-C 10/10/14 2116  Quintella Reichert, MD 10/11/14 [phone]

## 2014-10-10 NOTE — ED Notes (Signed)
Pt reports dry cough onset this week, states aggravating known hernia on L side. Pt reports smoking. [place].

## 2014-10-10 NOTE — Discharge Instructions (Signed)
.Read the information below.  You may return to the Emergency Department at any time for worsening condition or any new symptoms that concern you.  If you develop high fevers, worsening abdominal pain, uncontrolled vomiting, or are unable to tolerate fluids by mouth, return to the ER for a recheck.     Hernia A hernia occurs when an internal organ pushes out through a weak spot in the abdominal wall. Hernias most commonly occur in the groin and around the navel. Hernias often can be pushed back into place (reduced). Most hernias tend to get worse over time. Some abdominal hernias can get stuck in the opening (irreducible or incarcerated hernia) and cannot be reduced. An irreducible abdominal hernia which is tightly squeezed into the opening is at risk for impaired blood supply (strangulated hernia). A strangulated hernia is a medical emergency. Because of the risk for an irreducible or strangulated hernia, surgery may be recommended to repair a hernia. CAUSES   Heavy lifting.  Prolonged coughing.  Straining to have a bowel movement.  A cut (incision) made during an abdominal surgery. HOME CARE INSTRUCTIONS   Bed rest is not required. You may continue your normal activities.  Avoid lifting more than 10 pounds (4.5 kg) or straining.  Cough gently. If you are a smoker it is best to stop. Even the best hernia repair can break down with the continual strain of coughing. Even if you do not have your hernia repaired, a cough will continue to aggravate the problem.  Do not wear anything tight over your hernia. Do not try to keep it in with an outside bandage or truss. These can damage abdominal contents if they are trapped within the hernia sac.  Eat a normal diet.  Avoid constipation. Straining over long periods of time will increase hernia size and encourage breakdown of repairs. If you cannot do this with diet alone, stool softeners may be used. SEEK IMMEDIATE MEDICAL CARE IF:   You have a  fever.  You develop increasing abdominal pain.  You feel nauseous or vomit.  Your hernia is stuck outside the abdomen, looks discolored, feels hard, or is tender.  You have any changes in your bowel habits or in the hernia that are unusual for you.  You have increased pain or swelling around the hernia.  You cannot push the hernia back in place by applying gentle pressure while lying down. MAKE SURE YOU:   Understand these instructions.  Will watch your condition.  Will get help right away if you are not doing well or get worse. Document Released: 01/16/2005 Document Revised: 04/10/2011 Document Reviewed: 09/05/2007 [facility] Patient Information 2015 [place], [place]. This information is not intended to replace advice given to you by your health care provider. Make sure you discuss any questions you have with your health care provider.  Muscle Strain A muscle strain is an injury that occurs when a muscle is stretched beyond its normal length. Usually a small number of muscle fibers are torn when this happens. Muscle strain is rated in degrees. First-degree strains have the least amount of muscle fiber tearing and pain. Second-degree and third-degree strains have increasingly more tearing and pain.  Usually, recovery from muscle strain takes 1-2 weeks. Complete healing takes 5-6 weeks.  CAUSES  Muscle strain happens when a sudden, violent force placed on a muscle stretches it too far. This may occur with lifting, sports, or a fall.  RISK FACTORS Muscle strain is especially common in athletes.  SIGNS AND SYMPTOMS  At the site of the muscle strain, there may be:  Pain.  Bruising.  Swelling.  Difficulty using the muscle due to pain or lack of normal function. DIAGNOSIS  Your health care provider will perform a physical exam and ask about your medical history. TREATMENT  Often, the best treatment for a muscle strain is resting, icing, and applying cold compresses to the injured  area.  HOME CARE INSTRUCTIONS   Use the PRICE method of treatment to promote muscle healing during the first 2-3 days after your injury. The PRICE method involves:  Protecting the muscle from being injured again.  Restricting your activity and resting the injured body part.  Icing your injury. To do this, put ice in a plastic bag. Place a towel between your skin and the bag. Then, apply the ice and leave it on from 15-20 minutes each hour. After the third day, switch to moist heat packs.  Apply compression to the injured area with a splint or elastic bandage. Be careful not to wrap it too tightly. This may interfere with blood circulation or increase swelling.  Elevate the injured body part above the level of your heart as often as you can.  Only take over-the-counter or prescription medicines for pain, discomfort, or fever as directed by your health care provider.  Warming up prior to exercise helps to prevent future muscle strains. SEEK MEDICAL CARE IF:   You have increasing pain or swelling in the injured area.  You have numbness, tingling, or a significant loss of strength in the injured area. MAKE SURE YOU:   Understand these instructions.  Will watch your condition.  Will get help right away if you are not doing well or get worse. Document Released: 01/16/2005 Document Revised: 11/06/2012 Document Reviewed: 08/15/2012 [facility] Patient Information 2015 [place], [place]. This information is not intended to replace advice given to you by your health care provider. Make sure you discuss any questions you have with your health care provider.

## 2014-10-19 ENCOUNTER — Encounter (HOSPITAL_COMMUNITY): Payer: Self-pay | Admitting: Emergency Medicine

## 2014-10-19 ENCOUNTER — Emergency Department (HOSPITAL_COMMUNITY)
Admission: EM | Admit: 2014-10-19 | Discharge: 2014-10-19 | Disposition: A | Discharge status: Home / Self Care | Payer: Self-pay | Attending: Emergency Medicine | Admitting: Emergency Medicine

## 2014-10-19 ENCOUNTER — Emergency Department (HOSPITAL_COMMUNITY): Payer: Self-pay

## 2014-10-19 DIAGNOSIS — Z720 Tobacco use: Secondary | ICD-10-CM | POA: Insufficient documentation

## 2014-10-19 DIAGNOSIS — Z791 Long term (current) use of non-steroidal anti-inflammatories (NSAID): Secondary | ICD-10-CM | POA: Insufficient documentation

## 2014-10-19 DIAGNOSIS — Z79899 Other long term (current) drug therapy: Secondary | ICD-10-CM | POA: Insufficient documentation

## 2014-10-19 DIAGNOSIS — N200 Calculus of kidney: Secondary | ICD-10-CM | POA: Insufficient documentation

## 2014-10-19 HISTORY — DX: Disorder of kidney and ureter, unspecified: N28.9

## 2014-10-19 LAB — URINALYSIS, ROUTINE W REFLEX MICROSCOPIC
BILIRUBIN URINE: NEGATIVE
GLUCOSE, UA: NEGATIVE mg/dL
KETONES UR: NEGATIVE mg/dL
Nitrite: NEGATIVE
PH: 5.5 (ref 5.0–8.0)
PROTEIN: 30 mg/dL — AB
Specific Gravity, Urine: 1.021 (ref 1.005–1.030)
Urobilinogen, UA: 0.2 mg/dL (ref 0.0–1.0)

## 2014-10-19 LAB — COMPREHENSIVE METABOLIC PANEL
ALBUMIN: 3.8 g/dL (ref 3.5–5.0)
ALK PHOS: 57 U/L (ref 38–126)
ALT: 13 U/L — AB (ref 17–63)
AST: 18 U/L (ref 15–41)
Anion gap: 7 (ref 5–15)
BILIRUBIN TOTAL: 0.7 mg/dL (ref 0.3–1.2)
BUN: 7 mg/dL (ref 6–20)
CALCIUM: 9 mg/dL (ref 8.9–10.3)
CO2: 23 mmol/L (ref 22–32)
Chloride: 105 mmol/L (ref 101–111)
Creatinine, Ser: 0.95 mg/dL (ref 0.61–1.24)
GFR calc Af Amer: >60 mL/min (ref >60)
GFR calc non Af Amer: >60 mL/min (ref >60)
GLUCOSE: 103 mg/dL — AB (ref 65–99)
Potassium: 4.1 mmol/L (ref 3.5–5.1)
SODIUM: 135 mmol/L (ref 135–145)
Total Protein: 7 g/dL (ref 6.5–8.1)

## 2014-10-19 LAB — CBC WITH DIFFERENTIAL/PLATELET
BASOS ABS: 0 10*3/uL (ref 0.0–0.1)
BASOS PCT: 0 %
EOS ABS: 0.1 10*3/uL (ref 0.0–0.7)
Eosinophils Relative: 2 %
HEMATOCRIT: 43.1 % (ref 39.0–52.0)
Hemoglobin: 15.1 g/dL (ref 13.0–17.0)
Lymphocytes Relative: 31 %
Lymphs Abs: 2.8 10*3/uL (ref 0.7–4.0)
MCH: 32.9 pg (ref 26.0–34.0)
MCHC: 35 g/dL (ref 30.0–36.0)
MCV: 93.9 fL (ref 78.0–100.0)
MONOS PCT: 7 %
Monocytes Absolute: 0.7 10*3/uL (ref 0.1–1.0)
NEUTROS ABS: 5.6 10*3/uL (ref 1.7–7.7)
Neutrophils Relative %: 60 %
Platelets: 379 10*3/uL (ref 150–400)
RBC: 4.59 MIL/uL (ref 4.22–5.81)
RDW: 12.5 % (ref 11.5–15.5)
WBC: 9.3 10*3/uL (ref 4.0–10.5)

## 2014-10-19 LAB — URINE MICROSCOPIC-ADD ON

## 2014-10-19 LAB — LIPASE, BLOOD: Lipase: 28 U/L (ref 22–51)

## 2014-10-19 MED ORDER — FENTANYL CITRATE (PF) 100 MCG/2ML IJ SOLN
100.0000 ug | Freq: Once | INTRAMUSCULAR | Status: AC
  Administered 2014-10-19: 100 ug via INTRAVENOUS
  Filled 2014-10-19: qty 2

## 2014-10-19 MED ORDER — ONDANSETRON HCL 4 MG PO TABS: 4.0000 mg | ORAL_TABLET | Freq: Four times a day (QID) | ORAL | Status: DC

## 2014-10-19 MED ORDER — OXYCODONE-ACETAMINOPHEN 5-325 MG PO TABS: 1.0000 | ORAL_TABLET | ORAL | Status: DC | PRN

## 2014-10-19 MED ORDER — ACETAMINOPHEN 325 MG PO TABS
650.0000 mg | ORAL_TABLET | Freq: Once | ORAL | Status: AC
  Administered 2014-10-19: 650 mg via ORAL
  Filled 2014-10-19: qty 2

## 2014-10-19 NOTE — ED Provider Notes (Signed)
CSN: [id number]     Arrival date & time 10/19/14  0820 History   First MD Initiated Contact with Patient 10/19/14 0830     Chief Complaint  Patient presents with  . Flank Pain   HPI  [name] is a 44 year old male presenting with RLQ abdominal pain and right flank pain. Pt states that his abdominal pain and back pain is only present when coughing, lifting, doing sit ups or other strenuous activity. Pt states resting and sitting relieve the pain. He states the pain has been present intermittently for 1.5 months but has become more frequent. He has not tried OTC treatment. He is also complaining of hematuria x 3 months. He was seen in the emergency department 3 months ago at onset of hematuria and diagnosed with a renal calculi. He has not been evaluated by urology for this issue. He states that he was seen in the ED 2 weeks ago and diagnosed with a UTI but not treated with abx. Chart review shows he was seen for aggravation of hernia, not UTI. Denies fevers, chills, headache, shortness of breath, chest pain, nausea, vomiting, dysuria, increased urgency or increased frequency.   Past Medical History  Diagnosis Date  . Renal disorder    Past Surgical History  Procedure Laterality Date  . 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 Heart disease Father    Social History  Substance Use Topics  . Smoking status: Current Every Day Smoker -- 1.50 packs/day    Types: Cigarettes  . Smokeless tobacco: None  . Alcohol Use: 7.2 oz/week    12 Cans of beer per week    Review of Systems  Constitutional: Negative for fever, chills and diaphoresis.  Respiratory: Negative for shortness of breath.   Cardiovascular: Negative for chest pain.  Gastrointestinal: Positive for abdominal pain. Negative for nausea and vomiting.  Genitourinary: Positive for hematuria and flank pain. Negative for dysuria, urgency, frequency and difficulty  urinating.  Musculoskeletal: Positive for back pain.  Skin: Negative for rash.  Neurological: Negative for headaches.      Allergies  Review of patient's allergies indicates no known allergies.  Home Medications   Prior to Admission medications   Medication Sig Start Date End Date Taking? Authorizing Provider  ibuprofen (ADVIL,MOTRIN) 200 MG tablet Take 800 mg by mouth every 6 (six) hours as needed for moderate pain.   Yes Historical Provider, MD  methocarbamol (ROBAXIN) 500 MG tablet Take 1 tablet (500 mg total) by mouth 2 (two) times daily. 09/12/14  Yes Hanna Patel-Mills, PA-C  naproxen (NAPROSYN) 500 MG tablet Take 1 tablet (500 mg total) by mouth 2 (two) times daily. 09/12/14  Yes Hanna Patel-Mills, PA-C  ciprofloxacin (CIPRO) 500 MG tablet Take 1 tablet (500 mg total) by mouth 2 (two) times daily. Patient not taking: Reported on 10/19/2014 07/17/14   Wandra Arthurs, MD  cyclobenzaprine (FLEXERIL) 10 MG tablet Take 1 tablet (10 mg total) by mouth 2 (two) times daily as needed for muscle spasms. Patient not taking: Reported on 10/19/2014 06/20/14   Alvina Chou, PA-C  ibuprofen (ADVIL,MOTRIN) 600 MG tablet Take 1 tablet (600 mg total) by mouth every 6 (six) hours as needed for mild pain or moderate pain. Patient not taking: Reported on 10/19/2014 10/10/14   Clayton Bibles, PA-C  ondansetron (ZOFRAN) 4 MG tablet Take 1 tablet (4 mg total) by mouth  every 6 (six) hours. 10/19/14   Stevi Barrett, PA-C  oxyCODONE-acetaminophen (PERCOCET/ROXICET) 5-325 MG per tablet Take 1-2 tablets by mouth every 4 (four) hours as needed for severe pain. 10/19/14   Stevi Barrett, PA-C  simvastatin (ZOCOR) 10 MG tablet Take 1 tablet (10 mg total) by mouth at bedtime. Patient not taking: Reported on 10/19/2014 09/03/13   Tresa Garter, MD   BP 132/85 mmHg  Pulse 67  Temp(Src) 97.5 F (36.4 C) (Oral)  Resp 20  Ht 5\' 11"  (1.803 m)  Wt 218 lb (98.884 kg)  BMI 30.42 kg/m2  SpO2 98% Physical Exam   Constitutional: He appears well-developed and well-nourished. No distress.  HENT:  Head: Normocephalic and atraumatic.  Eyes: Conjunctivae are normal. Right eye exhibits no discharge. Left eye exhibits no discharge. No scleral icterus.  Neck: Normal range of motion.  Cardiovascular: Normal rate, regular rhythm and normal heart sounds.   Pulmonary/Chest: Effort normal and breath sounds normal. No respiratory distress. He has no wheezes. He has no rales.  Abdominal: Soft. Bowel sounds are normal. He exhibits no distension. There is tenderness. There is no rebound and no guarding.  No CVA tenderness. Mild tenderness on palpation of RLQ. Left inguinal hernia present with valsalva; easily reducible with no pain or color changes over the area.  Musculoskeletal: Normal range of motion.  Pt moves extremities spontaneously. Gait is stable.  Neurological: He is alert. Coordination normal.  Skin: Skin is warm and dry. No rash noted.  Psychiatric: He has a normal mood and affect. His behavior is normal.  Nursing note and vitals reviewed.   ED Course  Procedures (including critical care time) Labs Review Labs Reviewed  URINALYSIS, ROUTINE W REFLEX MICROSCOPIC (NOT AT [facility]) - Abnormal; Notable for the following:    Color, Urine AMBER (*)    APPearance CLOUDY (*)    Hgb urine dipstick LARGE (*)    Protein, ur 30 (*)    Leukocytes, UA SMALL (*)    All other components within normal limits  URINE MICROSCOPIC-ADD ON - Abnormal; Notable for the following:    Bacteria, UA FEW (*)    All other components within normal limits  COMPREHENSIVE METABOLIC PANEL - Abnormal; Notable for the following:    Glucose, Bld 103 (*)    ALT 13 (*)    All other components within normal limits  URINE CULTURE  CBC WITH DIFFERENTIAL/PLATELET  LIPASE, BLOOD    Imaging Review US Renal  10/19/2014   CLINICAL DATA:  Hematuria and right-sided flank pain  EXAM: RENAL / URINARY TRACT ULTRASOUND COMPLETE  COMPARISON:   07/17/2014  FINDINGS: Right Kidney:  Length: 10.2 cm. Mild hydronephrosis is noted. An echogenic focus is noted within the renal pelvis/proximal right ureter consistent with migration of previously seen calculus into the proximal right ureter.  Left Kidney:  Length: 11.0 cm. Echogenicity within normal limits. No mass or hydronephrosis visualized.  Bladder:  Appears normal for degree of bladder distention.  IMPRESSION: Echogenic focus within the right renal pelvis/proximal right ureter with mild hydronephrosis. This is consistent with migration of the previously seen calculus.   Electronically Signed   By: Inez Catalina M.D.   On: 10/19/2014 12:26   I have personally reviewed and evaluated these images and lab results as part of my medical decision-making.   EKG Interpretation None      MDM   Final diagnoses:  Nephrolithiasis   Pt presenting with intermittent RLQ and right flank pain x 1.5 months with recent  worsening. Pt also with 3 months of hematuria and known renal calculi. VSS. Pt nontoxic. Abdomen is soft with RLQ tenderness; no rebound, guarding or CVA tenderness. Left inguinal hernia present and unchanged. UA with large Hgb, small leuks and few bacteria. Urine culture pending. Renal US shows migration of renal calculi to proximal ureter. Pt given percocet and zofran for symptom control. Pt given referral to [facility] and instructed to schedule follow up appointment for later this week. Pt given strainer at discharge. Return precautions given in discharge paperwork and discussed with pt at bedside. Pt stable for discharge.     Lahoma Crocker Barrett, PA-C 10/19/14 1804  Orlie Dakin, MD 10/20/14 1404

## 2014-10-19 NOTE — ED Provider Notes (Signed)
Complain of right-sided abdominal pain intermittently for 1.5 months. He states he last vomited 2 nights ago. Admits to drinking 12 pack of beer 2 nights ago. He is presently hungry. No fever. Nothing makes pain better or worse. No other associated symptoms on exam alert no distress nontoxic lungs clear to auscultation heart regular rate and rhythm abdomen nondistended normoactive bowel sounds. Tender right upper and right lower quadrants her genitalia normal male. No flank tenderness  Orlie Dakin, MD 10/19/14 1143

## 2014-10-19 NOTE — ED Notes (Signed)
Ultrasound Bedside.

## 2014-10-19 NOTE — Discharge Instructions (Signed)
- Call urology to schedule a follow up appointment for later this week - Strain your urine - Percocet for pain control - Zofran for nausea   Kidney Stones Kidney stones (urolithiasis) are deposits that form inside your kidneys. The intense pain is caused by the stone moving through the urinary tract. When the stone moves, the ureter goes into spasm around the stone. The stone is usually passed in the urine.  CAUSES   A disorder that makes certain neck glands produce too much parathyroid hormone (primary hyperparathyroidism).  A buildup of uric acid crystals, similar to gout in your joints.  Narrowing (stricture) of the ureter.  A kidney obstruction present at birth (congenital obstruction).  Previous surgery on the kidney or ureters.  Numerous kidney infections. SYMPTOMS   Feeling sick to your stomach (nauseous).  Throwing up (vomiting).  Blood in the urine (hematuria).  Pain that usually spreads (radiates) to the groin.  Frequency or urgency of urination. DIAGNOSIS   Taking a history and physical exam.  Blood or urine tests.  CT scan.  Occasionally, an examination of the inside of the urinary bladder (cystoscopy) is performed. TREATMENT   Observation.  Increasing your fluid intake.  Extracorporeal shock wave lithotripsy--This is a noninvasive procedure that uses shock waves to break up kidney stones.  Surgery may be needed if you have severe pain or persistent obstruction. There are various surgical procedures. Most of the procedures are performed with the use of small instruments. Only small incisions are needed to accommodate these instruments, so recovery time is minimized. The size, location, and chemical composition are all important variables that will determine the proper choice of action for you. Talk to your health care provider to better understand your situation so that you will minimize the risk of injury to yourself and your kidney.  HOME CARE  INSTRUCTIONS   Drink enough water and fluids to keep your urine clear or pale yellow. This will help you to pass the stone or stone fragments.  Strain all urine through the provided strainer. Keep all particulate matter and stones for your health care provider to see. The stone causing the pain may be as small as a grain of salt. It is very important to use the strainer each and every time you pass your urine. The collection of your stone will allow your health care provider to analyze it and verify that a stone has actually passed. The stone analysis will often identify what you can do to reduce the incidence of recurrences.  Only take over-the-counter or prescription medicines for pain, discomfort, or fever as directed by your health care provider.  Make a follow-up appointment with your health care provider as directed.  Get follow-up X-rays if required. The absence of pain does not always mean that the stone has passed. It may have only stopped moving. If the urine remains completely obstructed, it can cause loss of kidney function or even complete destruction of the kidney. It is your responsibility to make sure X-rays and follow-ups are completed. Ultrasounds of the kidney can show blockages and the status of the kidney. Ultrasounds are not associated with any radiation and can be performed easily in a matter of minutes. SEEK MEDICAL CARE IF:  You experience pain that is progressive and unresponsive to any pain medicine you have been prescribed. SEEK IMMEDIATE MEDICAL CARE IF:   Pain cannot be controlled with the prescribed medicine.  You have a fever or shaking chills.  The severity or intensity of  pain increases over 18 hours and is not relieved by pain medicine.  You develop a new onset of abdominal pain.  You feel faint or pass out.  You are unable to urinate. MAKE SURE YOU:   Understand these instructions.  Will watch your condition.  Will get help right away if you are  not doing well or get worse. Document Released: 01/16/2005 Document Revised: 09/18/2012 Document Reviewed: 06/19/2012 [facility] Patient Information 2015 [place], [place]. This information is not intended to replace advice given to you by your health care provider. Make sure you discuss any questions you have with your health care provider.

## 2014-10-19 NOTE — ED Notes (Signed)
Bed: WA17 Expected date:  Expected time:  Means of arrival:  Comments: EMS-abdominal pain 

## 2014-10-19 NOTE — ED Notes (Signed)
Pt complains of rt side flank pain.  According to pt, he was seen at [facility] 2 weeks ago for tx and was diagnosed with UTI, given ibuprofen and no abx.  Pt has been working as usual until yesterday his pain became unbearable and sought treatment this am.  Pt also said he has had blood in urine for the last 3 months.

## 2014-10-19 NOTE — ED Notes (Signed)
Phlebotomy collecting blood

## 2014-10-20 LAB — URINE CULTURE: Culture: NO GROWTH

## 2014-11-14 ENCOUNTER — Emergency Department (HOSPITAL_COMMUNITY)
Admission: EM | Admit: 2014-11-14 | Discharge: 2014-11-14 | Disposition: A | Discharge status: Home / Self Care | Payer: Self-pay | Attending: Emergency Medicine | Admitting: Emergency Medicine

## 2014-11-14 ENCOUNTER — Encounter (HOSPITAL_COMMUNITY): Payer: Self-pay | Admitting: Emergency Medicine

## 2014-11-14 DIAGNOSIS — Z87448 Personal history of other diseases of urinary system: Secondary | ICD-10-CM | POA: Insufficient documentation

## 2014-11-14 DIAGNOSIS — Z79899 Other long term (current) drug therapy: Secondary | ICD-10-CM | POA: Insufficient documentation

## 2014-11-14 DIAGNOSIS — M5431 Sciatica, right side: Secondary | ICD-10-CM | POA: Insufficient documentation

## 2014-11-14 DIAGNOSIS — Z791 Long term (current) use of non-steroidal anti-inflammatories (NSAID): Secondary | ICD-10-CM | POA: Insufficient documentation

## 2014-11-14 DIAGNOSIS — M25551 Pain in right hip: Secondary | ICD-10-CM | POA: Insufficient documentation

## 2014-11-14 DIAGNOSIS — Z720 Tobacco use: Secondary | ICD-10-CM | POA: Insufficient documentation

## 2014-11-14 MED ORDER — IBUPROFEN 600 MG PO TABS: 600.0000 mg | ORAL_TABLET | Freq: Four times a day (QID) | ORAL | Status: DC | PRN

## 2014-11-14 NOTE — ED Provider Notes (Signed)
CSN: [id number]     Arrival date & time 11/14/14  1919 History   First MD Initiated Contact with Patient 11/14/14 1932     Chief Complaint  Patient presents with  . Hip Pain     (Consider location/radiation/quality/duration/timing/severity/associated sxs/prior Treatment) HPI Comments: Patient presents to emergency department with chief complaint of right side pain. Patient states that the pain is worsened with lifting. He states that he frequently has to lift while at work. He states that the pain originates in his low back and radiates down his right leg. He denies any bowel or bladder incontinence. He has not taken anything to alleviate his symptoms. He denies any fevers chills.  The history is provided by the patient. No language interpreter was used.    Past Medical History  Diagnosis Date  . Renal disorder    Past Surgical History  Procedure Laterality Date  . 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 Heart disease Father    Social History  Substance Use Topics  . Smoking status: Current Every Day Smoker -- 1.50 packs/day    Types: Cigarettes  . Smokeless tobacco: None  . Alcohol Use: 7.2 oz/week    12 Cans of beer per week    Review of Systems  Constitutional: Negative for fever and chills.  Gastrointestinal:       No bowel incontinence  Genitourinary:       No urinary incontinence  Musculoskeletal: Positive for myalgias, back pain and arthralgias.  Neurological:       No saddle anesthesia  All other systems reviewed and are negative.     Allergies  Review of patient's allergies indicates no known allergies.  Home Medications   Prior to Admission medications   Medication Sig Start Date End Date Taking? Authorizing Provider  ciprofloxacin (CIPRO) 500 MG tablet Take 1 tablet (500 mg total) by mouth 2 (two) times daily. Patient not taking: Reported on 10/19/2014 07/17/14   Wandra Arthurs, MD   cyclobenzaprine (FLEXERIL) 10 MG tablet Take 1 tablet (10 mg total) by mouth 2 (two) times daily as needed for muscle spasms. Patient not taking: Reported on 10/19/2014 06/20/14   Alvina Chou, PA-C  ibuprofen (ADVIL,MOTRIN) 600 MG tablet Take 1 tablet (600 mg total) by mouth every 6 (six) hours as needed. 11/14/14   Montine Circle, PA-C  methocarbamol (ROBAXIN) 500 MG tablet Take 1 tablet (500 mg total) by mouth 2 (two) times daily. 09/12/14   Hanna Patel-Mills, PA-C  naproxen (NAPROSYN) 500 MG tablet Take 1 tablet (500 mg total) by mouth 2 (two) times daily. 09/12/14   Hanna Patel-Mills, PA-C  ondansetron (ZOFRAN) 4 MG tablet Take 1 tablet (4 mg total) by mouth every 6 (six) hours. 10/19/14   Stevi Barrett, PA-C  oxyCODONE-acetaminophen (PERCOCET/ROXICET) 5-325 MG per tablet Take 1-2 tablets by mouth every 4 (four) hours as needed for severe pain. 10/19/14   Stevi Barrett, PA-C  simvastatin (ZOCOR) 10 MG tablet Take 1 tablet (10 mg total) by mouth at bedtime. Patient not taking: Reported on 10/19/2014 09/03/13   Tresa Garter, MD   BP 127/74 mmHg  Pulse 68  Temp(Src) 97.8 F (36.6 C) (Oral)  Resp 20  Ht 5\' 11"  (1.803 m)  Wt 214 lb (97.07 kg)  BMI 29.86 kg/m2  SpO2 98% Physical Exam  Constitutional: He is oriented to person, place, and time. He appears  well-developed and well-nourished. No distress.  HENT:  Head: Normocephalic and atraumatic.  Eyes: Conjunctivae and EOM are normal. Right eye exhibits no discharge. Left eye exhibits no discharge. No scleral icterus.  Neck: Normal range of motion. Neck supple. No tracheal deviation present.  Cardiovascular: Normal rate, regular rhythm and normal heart sounds.  Exam reveals no gallop and no friction rub.   No murmur heard. Pulmonary/Chest: Effort normal and breath sounds normal. No respiratory distress. He has no wheezes.  Abdominal: Soft. He exhibits no distension. There is no tenderness.  Musculoskeletal: Normal range of motion.   Right lumbar paraspinal muscles tender to palpation, no bony tenderness, step-offs, or gross abnormality or deformity of spine, patient is able to ambulate, moves all extremities  Bilateral great toe extension intact Bilateral plantar/dorsiflexion intact  Neurological: He is alert and oriented to person, place, and time.  Sensation and strength intact bilaterally   Skin: Skin is warm. He is not diaphoretic.  Psychiatric: He has a normal mood and affect. His behavior is normal. Judgment and thought content normal.  Nursing note and vitals reviewed.   ED Course  Procedures (including critical care time)   MDM   Final diagnoses:  Sciatica of right side    Patient with back pain.  No neurological deficits and normal neuro exam.  Patient is ambulatory.  No loss of bowel or bladder control.  Doubt cauda equina.  Denies fever,  doubt epidural abscess or other lesion. Recommend back exercises, stretching, RICE, and will treat with a short course of ibuprofen.  Encouraged the patient that there could be a need for additional workup and/or imaging such as MRI, if the symptoms do not resolve. Patient advised that if the back pain does not resolve, or radiates, this could progress to more serious conditions and is encouraged to follow-up with PCP or orthopedics within 2 weeks.       Montine Circle, PA-C 11/14/14 2011  Orpah Greek, MD 11/14/14 [phone]

## 2014-11-14 NOTE — Discharge Instructions (Signed)
Sciatica With Rehab The sciatic nerve runs from the back down the leg and is responsible for sensation and control of the muscles in the back (posterior) side of the thigh, lower leg, and foot. Sciatica is a condition that is characterized by inflammation of this nerve.  SYMPTOMS   Signs of nerve damage, including numbness and/or weakness along the posterior side of the lower extremity.  Pain in the back of the thigh that may also travel down the leg.  Pain that worsens when sitting for long periods of time.  Occasionally, pain in the back or buttock. CAUSES  Inflammation of the sciatic nerve is the cause of sciatica. The inflammation is due to something irritating the nerve. Common sources of irritation include:  Sitting for long periods of time.  Direct trauma to the nerve.  Arthritis of the spine.  Herniated or ruptured disk.  Slipping of the vertebrae (spondylolisthesis).  Pressure from soft tissues, such as muscles or ligament-like tissue (fascia). RISK INCREASES WITH:  Sports that place pressure or stress on the spine (football or weightlifting).  Poor strength and flexibility.  Failure to warm up properly before activity.  Family history of low back pain or disk disorders.  Previous back injury or surgery.  Poor body mechanics, especially when lifting, or poor posture. PREVENTION   Warm up and stretch properly before activity.  Maintain physical fitness:  Strength, flexibility, and endurance.  Cardiovascular fitness.  Learn and use proper technique, especially with posture and lifting. When possible, have coach correct improper technique.  Avoid activities that place stress on the spine. PROGNOSIS If treated properly, then sciatica usually resolves within 6 weeks. However, occasionally surgery is necessary.  RELATED COMPLICATIONS   Permanent nerve damage, including pain, numbness, tingle, or weakness.  Chronic back pain.  Risks of surgery: infection,  bleeding, nerve damage, or damage to surrounding tissues. TREATMENT Treatment initially involves resting from any activities that aggravate your symptoms. The use of ice and medication may help reduce pain and inflammation. The use of strengthening and stretching exercises may help reduce pain with activity. These exercises may be performed at home or with referral to a therapist. A therapist may recommend further treatments, such as transcutaneous electronic nerve stimulation (TENS) or ultrasound. Your caregiver may recommend corticosteroid injections to help reduce inflammation of the sciatic nerve. If symptoms persist despite non-surgical (conservative) treatment, then surgery may be recommended. MEDICATION  If pain medication is necessary, then nonsteroidal anti-inflammatory medications, such as aspirin and ibuprofen, or other minor pain relievers, such as acetaminophen, are often recommended.  Do not take pain medication for 7 days before surgery.  Prescription pain relievers may be given if deemed necessary by your caregiver. Use only as directed and only as much as you need.  Ointments applied to the skin may be helpful.  Corticosteroid injections may be given by your caregiver. These injections should be reserved for the most serious cases, because they may only be given a certain number of times. HEAT AND COLD  Cold treatment (icing) relieves pain and reduces inflammation. Cold treatment should be applied for 10 to 15 minutes every 2 to 3 hours for inflammation and pain and immediately after any activity that aggravates your symptoms. Use ice packs or massage the area with a piece of ice (ice massage).  Heat treatment may be used prior to performing the stretching and strengthening activities prescribed by your caregiver, physical therapist, or athletic trainer. Use a heat pack or soak the injury in warm water.   SEEK MEDICAL CARE IF:  Treatment seems to offer no benefit, or the condition  worsens.  Any medications produce adverse side effects. EXERCISES  RANGE OF MOTION (ROM) AND STRETCHING EXERCISES - Sciatica Most people with sciatic will find that their symptoms worsen with either excessive bending forward (flexion) or arching at the low back (extension). The exercises which will help resolve your symptoms will focus on the opposite motion. Your physician, physical therapist or athletic trainer will help you determine which exercises will be most helpful to resolve your low back pain. Do not complete any exercises without first consulting with your clinician. Discontinue any exercises which worsen your symptoms until you speak to your clinician. If you have pain, numbness or tingling which travels down into your buttocks, leg or foot, the goal of the therapy is for these symptoms to move closer to your back and eventually resolve. Occasionally, these leg symptoms will get better, but your low back pain may worsen; this is typically an indication of progress in your rehabilitation. Be certain to be very alert to any changes in your symptoms and the activities in which you participated in the 24 hours prior to the change. Sharing this information with your clinician will allow him/her to most efficiently treat your condition. These exercises may help you when beginning to rehabilitate your injury. Your symptoms may resolve with or without further involvement from your physician, physical therapist or athletic trainer. While completing these exercises, remember:   Restoring tissue flexibility helps normal motion to return to the joints. This allows healthier, less painful movement and activity.  An effective stretch should be held for at least 30 seconds.  A stretch should never be painful. You should only feel a gentle lengthening or release in the stretched tissue. FLEXION RANGE OF MOTION AND STRETCHING EXERCISES: STRETCH - Flexion, Single Knee to Chest   Lie on a firm bed or floor  with both legs extended in front of you.  Keeping one leg in contact with the floor, bring your opposite knee to your chest. Hold your leg in place by either grabbing behind your thigh or at your knee.  Pull until you feel a gentle stretch in your low back. Hold __________ seconds.  Slowly release your grasp and repeat the exercise with the opposite side. Repeat __________ times. Complete this exercise __________ times per day.  STRETCH - Flexion, Double Knee to Chest  Lie on a firm bed or floor with both legs extended in front of you.  Keeping one leg in contact with the floor, bring your opposite knee to your chest.  Tense your stomach muscles to support your back and then lift your other knee to your chest. Hold your legs in place by either grabbing behind your thighs or at your knees.  Pull both knees toward your chest until you feel a gentle stretch in your low back. Hold __________ seconds.  Tense your stomach muscles and slowly return one leg at a time to the floor. Repeat __________ times. Complete this exercise __________ times per day.  STRETCH - Low Trunk Rotation   Lie on a firm bed or floor. Keeping your legs in front of you, bend your knees so they are both pointed toward the ceiling and your feet are flat on the floor.  Extend your arms out to the side. This will stabilize your upper body by keeping your shoulders in contact with the floor.  Gently and slowly drop both knees together to one side until   you feel a gentle stretch in your low back. Hold for __________ seconds.  Tense your stomach muscles to support your low back as you bring your knees back to the starting position. Repeat the exercise to the other side. Repeat __________ times. Complete this exercise __________ times per day  EXTENSION RANGE OF MOTION AND FLEXIBILITY EXERCISES: STRETCH - Extension, Prone on Elbows  Lie on your stomach on the floor, a bed will be too soft. Place your palms about shoulder  width apart and at the height of your head.  Place your elbows under your shoulders. If this is too painful, stack pillows under your chest.  Allow your body to relax so that your hips drop lower and make contact more completely with the floor.  Hold this position for __________ seconds.  Slowly return to lying flat on the floor. Repeat __________ times. Complete this exercise __________ times per day.  RANGE OF MOTION - Extension, Prone Press Ups  Lie on your stomach on the floor, a bed will be too soft. Place your palms about shoulder width apart and at the height of your head.  Keeping your back as relaxed as possible, slowly straighten your elbows while keeping your hips on the floor. You may adjust the placement of your hands to maximize your comfort. As you gain motion, your hands will come more underneath your shoulders.  Hold this position __________ seconds.  Slowly return to lying flat on the floor. Repeat __________ times. Complete this exercise __________ times per day.  STRENGTHENING EXERCISES - Sciatica  These exercises may help you when beginning to rehabilitate your injury. These exercises should be done near your "sweet spot." This is the neutral, low-back arch, somewhere between fully rounded and fully arched, that is your least painful position. When performed in this safe range of motion, these exercises can be used for people who have either a flexion or extension based injury. These exercises may resolve your symptoms with or without further involvement from your physician, physical therapist or athletic trainer. While completing these exercises, remember:   Muscles can gain both the endurance and the strength needed for everyday activities through controlled exercises.  Complete these exercises as instructed by your physician, physical therapist or athletic trainer. Progress with the resistance and repetition exercises only as your caregiver advises.  You may  experience muscle soreness or fatigue, but the pain or discomfort you are trying to eliminate should never worsen during these exercises. If this pain does worsen, stop and make certain you are following the directions exactly. If the pain is still present after adjustments, discontinue the exercise until you can discuss the trouble with your clinician. STRENGTHENING - Deep Abdominals, Pelvic Tilt   Lie on a firm bed or floor. Keeping your legs in front of you, bend your knees so they are both pointed toward the ceiling and your feet are flat on the floor.  Tense your lower abdominal muscles to press your low back into the floor. This motion will rotate your pelvis so that your tail bone is scooping upwards rather than pointing at your feet or into the floor.  With a gentle tension and even breathing, hold this position for __________ seconds. Repeat __________ times. Complete this exercise __________ times per day.  STRENGTHENING - Abdominals, Crunches   Lie on a firm bed or floor. Keeping your legs in front of you, bend your knees so they are both pointed toward the ceiling and your feet are flat on the   floor. Cross your arms over your chest.  Slightly tip your chin down without bending your neck.  Tense your abdominals and slowly lift your trunk high enough to just clear your shoulder blades. Lifting higher can put excessive stress on the low back and does not further strengthen your abdominal muscles.  Control your return to the starting position. Repeat __________ times. Complete this exercise __________ times per day.  STRENGTHENING - Quadruped, Opposite UE/LE Lift  Assume a hands and knees position on a firm surface. Keep your hands under your shoulders and your knees under your hips. You may place padding under your knees for comfort.  Find your neutral spine and gently tense your abdominal muscles so that you can maintain this position. Your shoulders and hips should form a rectangle  that is parallel with the floor and is not twisted.  Keeping your trunk steady, lift your right hand no higher than your shoulder and then your left leg no higher than your hip. Make sure you are not holding your breath. Hold this position __________ seconds.  Continuing to keep your abdominal muscles tense and your back steady, slowly return to your starting position. Repeat with the opposite arm and leg. Repeat __________ times. Complete this exercise __________ times per day.  STRENGTHENING - Abdominals and Quadriceps, Straight Leg Raise   Lie on a firm bed or floor with both legs extended in front of you.  Keeping one leg in contact with the floor, bend the other knee so that your foot can rest flat on the floor.  Find your neutral spine, and tense your abdominal muscles to maintain your spinal position throughout the exercise.  Slowly lift your straight leg off the floor about 6 inches for a count of 15, making sure to not hold your breath.  Still keeping your neutral spine, slowly lower your leg all the way to the floor. Repeat this exercise with each leg __________ times. Complete this exercise __________ times per day. POSTURE AND BODY MECHANICS CONSIDERATIONS - Sciatica Keeping correct posture when sitting, standing or completing your activities will reduce the stress put on different body tissues, allowing injured tissues a chance to heal and limiting painful experiences. The following are general guidelines for improved posture. Your physician or physical therapist will provide you with any instructions specific to your needs. While reading these guidelines, remember:  The exercises prescribed by your provider will help you have the flexibility and strength to maintain correct postures.  The correct posture provides the optimal environment for your joints to work. All of your joints have less wear and tear when properly supported by a spine with good posture. This means you will  experience a healthier, less painful body.  Correct posture must be practiced with all of your activities, especially prolonged sitting and standing. Correct posture is as important when doing repetitive low-stress activities (typing) as it is when doing a single heavy-load activity (lifting). RESTING POSITIONS Consider which positions are most painful for you when choosing a resting position. If you have pain with flexion-based activities (sitting, bending, stooping, squatting), choose a position that allows you to rest in a less flexed posture. You would want to avoid curling into a fetal position on your side. If your pain worsens with extension-based activities (prolonged standing, working overhead), avoid resting in an extended position such as sleeping on your stomach. Most people will find more comfort when they rest with their spine in a more neutral position, neither too rounded nor too   arched. Lying on a non-sagging bed on your side with a pillow between your knees, or on your back with a pillow under your knees will often provide some relief. Keep in mind, being in any one position for a prolonged period of time, no matter how correct your posture, can still lead to stiffness. PROPER SITTING POSTURE In order to minimize stress and discomfort on your spine, you must sit with correct posture Sitting with good posture should be effortless for a healthy body. Returning to good posture is a gradual process. Many people can work toward this most comfortably by using various supports until they have the flexibility and strength to maintain this posture on their own. When sitting with proper posture, your ears will fall over your shoulders and your shoulders will fall over your hips. You should use the back of the chair to support your upper back. Your low back will be in a neutral position, just slightly arched. You may place a small pillow or folded towel at the base of your low back for support.  When  working at a desk, create an environment that supports good, upright posture. Without extra support, muscles fatigue and lead to excessive strain on joints and other tissues. Keep these recommendations in mind: CHAIR:   A chair should be able to slide under your desk when your back makes contact with the back of the chair. This allows you to work closely.  The chair's height should allow your eyes to be level with the upper part of your monitor and your hands to be slightly lower than your elbows. BODY POSITION  Your feet should make contact with the floor. If this is not possible, use a foot rest.  Keep your ears over your shoulders. This will reduce stress on your neck and low back. INCORRECT SITTING POSTURES   If you are feeling tired and unable to assume a healthy sitting posture, do not slouch or slump. This puts excessive strain on your back tissues, causing more damage and pain. Healthier options include:  Using more support, like a lumbar pillow.  Switching tasks to something that requires you to be upright or walking.  Talking a brief walk.  Lying down to rest in a neutral-spine position. PROLONGED STANDING WHILE SLIGHTLY LEANING FORWARD  When completing a task that requires you to lean forward while standing in one place for a long time, place either foot up on a stationary 2-4 inch high object to help maintain the best posture. When both feet are on the ground, the low back tends to lose its slight inward curve. If this curve flattens (or becomes too large), then the back and your other joints will experience too much stress, fatigue more quickly and can cause pain.  CORRECT STANDING POSTURES Proper standing posture should be assumed with all daily activities, even if they only take a few moments, like when brushing your teeth. As in sitting, your ears should fall over your shoulders and your shoulders should fall over your hips. You should keep a slight tension in your abdominal  muscles to brace your spine. Your tailbone should point down to the ground, not behind your body, resulting in an over-extended swayback posture.  INCORRECT STANDING POSTURES  Common incorrect standing postures include a forward head, locked knees and/or an excessive swayback. WALKING Walk with an upright posture. Your ears, shoulders and hips should all line-up. PROLONGED ACTIVITY IN A FLEXED POSITION When completing a task that requires you to bend forward   at your waist or lean over a low surface, try to find a way to stabilize 3 of 4 of your limbs. You can place a hand or elbow on your thigh or rest a knee on the surface you are reaching across. This will provide you more stability so that your muscles do not fatigue as quickly. By keeping your knees relaxed, or slightly bent, you will also reduce stress across your low back. CORRECT LIFTING TECHNIQUES DO :   Assume a wide stance. This will provide you more stability and the opportunity to get as close as possible to the object which you are lifting.  Tense your abdominals to brace your spine; then bend at the knees and hips. Keeping your back locked in a neutral-spine position, lift using your leg muscles. Lift with your legs, keeping your back straight.  Test the weight of unknown objects before attempting to lift them.  Try to keep your elbows locked down at your sides in order get the best strength from your shoulders when carrying an object.  Always ask for help when lifting heavy or awkward objects. INCORRECT LIFTING TECHNIQUES DO NOT:   Lock your knees when lifting, even if it is a small object.  Bend and twist. Pivot at your feet or move your feet when needing to change directions.  Assume that you cannot safely pick up a paperclip without proper posture.   This information is not intended to replace advice given to you by your health care provider. Make sure you discuss any questions you have with your health care provider.     Document Released: 01/16/2005 Document Revised: 06/02/2014 Document Reviewed: 04/30/2008 [employer] Interactive Patient Education 2016 [employer]  

## 2014-11-14 NOTE — ED Notes (Signed)
Patient here with right lateral hip pain. Pain extends from lower back, wraps around upper leg, and connects to knee. If patient places weight on right foot and leans left causing the right hip to extend pain is intensified. Denies history of the same. 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 Ambulatory.

## 2014-11-14 NOTE — ED Notes (Signed)
The pt is c/o pain in his rt hip  For awhile   Tonight while headed to work he noticed he had pain in his rt foot also when standing.  No known injury

## 2014-12-09 ENCOUNTER — Emergency Department (HOSPITAL_COMMUNITY): Payer: Self-pay

## 2014-12-09 ENCOUNTER — Encounter (HOSPITAL_COMMUNITY): Payer: Self-pay | Admitting: Emergency Medicine

## 2014-12-09 ENCOUNTER — Emergency Department (HOSPITAL_COMMUNITY)
Admission: EM | Admit: 2014-12-09 | Discharge: 2014-12-09 | Disposition: A | Discharge status: Home / Self Care | Payer: Self-pay | Attending: Physician Assistant | Admitting: Physician Assistant

## 2014-12-09 DIAGNOSIS — Z720 Tobacco use: Secondary | ICD-10-CM | POA: Insufficient documentation

## 2014-12-09 DIAGNOSIS — Z79899 Other long term (current) drug therapy: Secondary | ICD-10-CM | POA: Insufficient documentation

## 2014-12-09 DIAGNOSIS — M79674 Pain in right toe(s): Secondary | ICD-10-CM | POA: Insufficient documentation

## 2014-12-09 NOTE — Discharge Instructions (Signed)

## 2014-12-09 NOTE — ED Provider Notes (Signed)
CSN: [id number]     Arrival date & time 12/09/14  0806 History   First MD Initiated Contact with Patient 12/09/14 [phone]     Chief Complaint  Patient presents with  . Toe Pain     (Consider location/radiation/quality/duration/timing/severity/associated sxs/prior Treatment) HPI   [name] is a 44 y.o. male with PMH significant for renal disorder who presents with progressively worsening right great toe pain.  Patient reports he stubbed his toe 5 days ago.  Assoc symptoms include swelling.  Denies numbness, tingling, or weakness.  He is ambulatory, and reports he is on his feet a lot with work.  He has tried sea salt soaks and motrin, and states that this is improving his pain.   Past Medical History  Diagnosis Date  . Renal disorder    Past Surgical History  Procedure Laterality Date  . 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 Heart disease Father    Social History  Substance Use Topics  . Smoking status: Current Every Day Smoker -- 1.50 packs/day    Types: Cigarettes  . Smokeless tobacco: None  . Alcohol Use: 7.2 oz/week    12 Cans of beer per week    Review of Systems All other systems negative unless otherwise stated in HPI    Allergies  Review of patient's allergies indicates no known allergies.  Home Medications   Prior to Admission medications   Medication Sig Start Date End Date Taking? Authorizing Provider  ciprofloxacin (CIPRO) 500 MG tablet Take 1 tablet (500 mg total) by mouth 2 (two) times daily. Patient not taking: Reported on 10/19/2014 07/17/14   Wandra Arthurs, MD  cyclobenzaprine (FLEXERIL) 10 MG tablet Take 1 tablet (10 mg total) by mouth 2 (two) times daily as needed for muscle spasms. Patient not taking: Reported on 10/19/2014 06/20/14   Alvina Chou, PA-C  ibuprofen (ADVIL,MOTRIN) 600 MG tablet Take 1 tablet (600 mg total) by mouth every 6 (six) hours as needed. 11/14/14   Montine Circle, PA-C  methocarbamol (ROBAXIN) 500 MG tablet Take 1 tablet (500 mg total) by mouth 2 (two) times daily. 09/12/14   Hanna Patel-Mills, PA-C  naproxen (NAPROSYN) 500 MG tablet Take 1 tablet (500 mg total) by mouth 2 (two) times daily. 09/12/14   Hanna Patel-Mills, PA-C  ondansetron (ZOFRAN) 4 MG tablet Take 1 tablet (4 mg total) by mouth every 6 (six) hours. 10/19/14   Stevi Barrett, PA-C  oxyCODONE-acetaminophen (PERCOCET/ROXICET) 5-325 MG per tablet Take 1-2 tablets by mouth every 4 (four) hours as needed for severe pain. 10/19/14   Stevi Barrett, PA-C  simvastatin (ZOCOR) 10 MG tablet Take 1 tablet (10 mg total) by mouth at bedtime. Patient not taking: Reported on 10/19/2014 09/03/13   Tresa Garter, MD   BP 149/96 mmHg  Pulse 75  Temp(Src) 97.5 F (36.4 C) (Oral)  Resp 18  SpO2 99% Physical Exam  Constitutional: He is oriented to person, place, and time. He appears well-developed and well-nourished.  HENT:  Head: Normocephalic and atraumatic.  Mouth/Throat: Oropharynx is clear and moist.  Eyes: Conjunctivae are normal. Pupils are equal, round, and reactive to light.  Neck: Normal range of motion. Neck supple.  Cardiovascular: Normal rate, regular rhythm and normal heart sounds.   No murmur heard. Pulmonary/Chest: Effort normal and breath sounds normal. No accessory muscle usage or stridor. No respiratory distress. He has  no wheezes. He has no rhonchi. He has no rales.  Abdominal: Soft. Bowel sounds are normal. He exhibits no distension. There is no tenderness.  Musculoskeletal: Normal range of motion.  Moderate swelling of the right great toe.  TTP along medial and lateral joint.  Slightly decreased ROM secondary to swelling.  No erythema, warmth, or drainage.  5/5 strength in right lower extremity.  Lymphadenopathy:    He has no cervical adenopathy.  Neurological: He is alert and oriented to person, place, and time.  Speech clear without dysarthria.  Sensation intact.    Skin: Skin is warm and dry.  Psychiatric: He has a normal mood and affect. His behavior is normal.    ED Course  Procedures (including critical care time) Labs Review Labs Reviewed - No data to display  Imaging Review Dg Toe Great Right  12/09/2014  CLINICAL DATA:  Initial encounter for Hit Right great toe on dresser 5 days ago. Pain and swelling of great toe and unable to bear weight on it. EXAM: RIGHT GREAT TOE COMPARISON:  None. FINDINGS: No acute fracture or dislocation.  No definite soft tissue swelling. IMPRESSION: No acute osseous abnormality. Electronically Signed   By: Abigail Miyamoto M.D.   On: 12/09/2014 08:58   I have personally reviewed and evaluated these images and lab results as part of my medical decision-making.   EKG Interpretation None      MDM   Final diagnoses:  Great toe pain, right    Patient presents with right great toe pain.  VSS, NAD.  Patient is ambulatory.  On exam, neurovascularly intact.  Toe is TTP in lateral and medial joint line.  Moderate swelling. No erythema, warmth or redness.  Plain films negative for fracture.  Suspect contusion of toe.  Motrin for pain.    Evaluation does not show pathology requring ongoing emergent intervention or admission. Pt is hemodynamically stable and mentating appropriately. Discussed findings/results and plan with patient/guardian, who agrees with plan. All questions answered. Return precautions discussed and outpatient follow up given.      Gloriann Loan, PA-C 12/09/14 6553  Courteney Julio Alm, MD 12/09/14 1623

## 2014-12-09 NOTE — ED Notes (Signed)
Patient states stumped his R great toe on Friday of last week.   Patient states that he had no trouble until Sunday and it "started itching".   Patient states by Monday, the toe was swollen and painful.   Patient complains of same today.

## 2016-03-31 IMAGING — US US RENAL
1 series · 14 of 25 positions shown · non-contrast
Comparison: 07/17/2014

CLINICAL DATA: Hematuria and right-sided flank pain

EXAM:
RENAL / URINARY TRACT ULTRASOUND COMPLETE

[Series 1: us renal · 0.27mm/px · 14 of 45 slices shown]
[im 1/45]
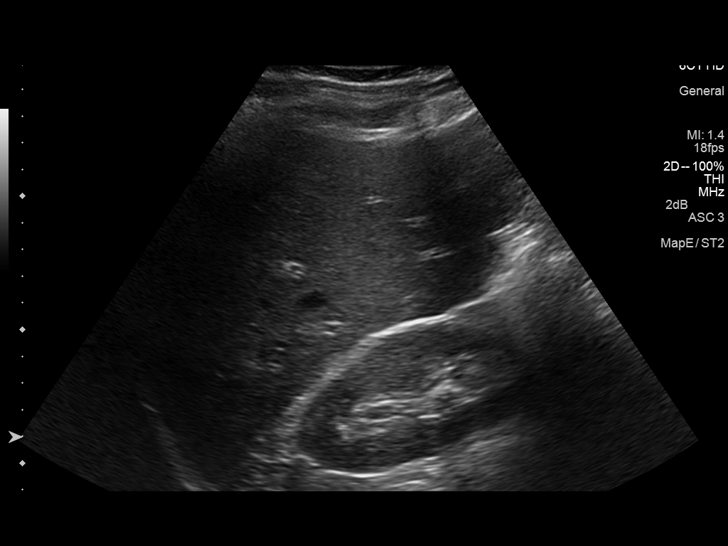
[im 4/45]
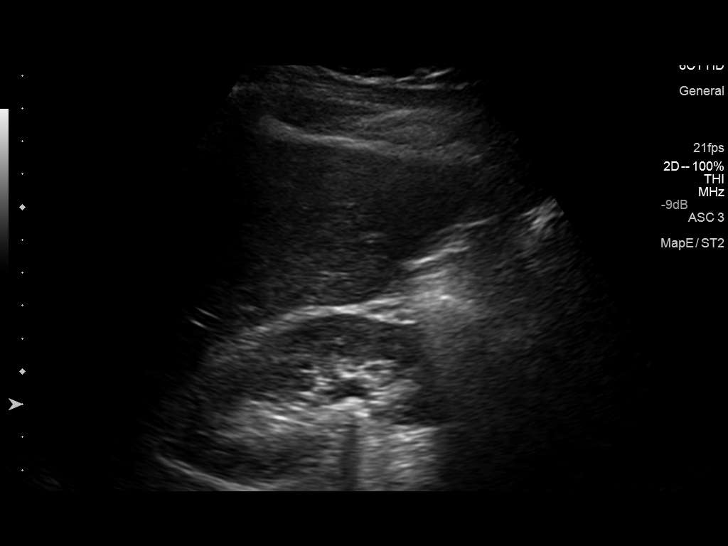
[im 8/45]
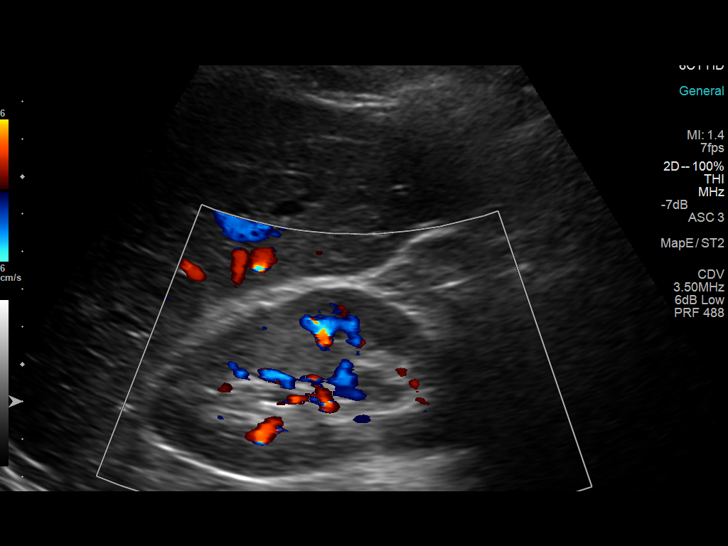
[im 12/45]
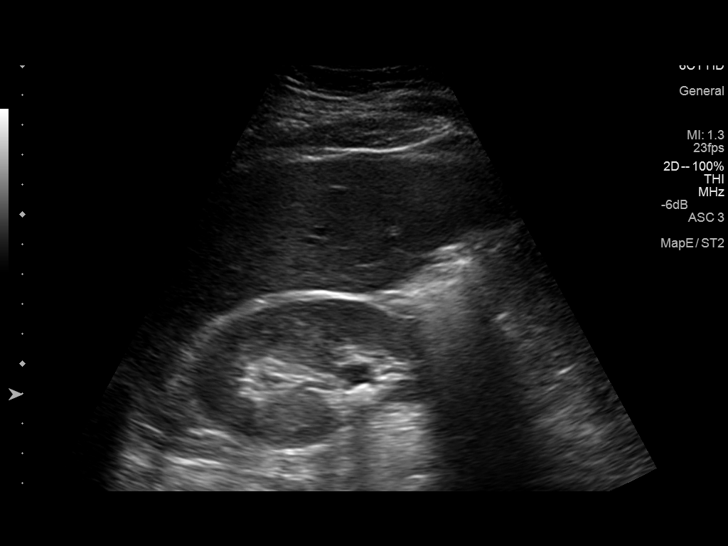
[im 15/45]
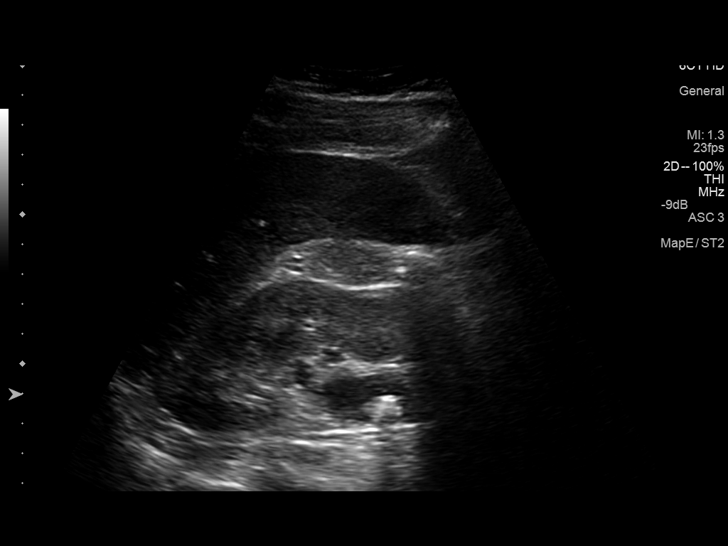
[im 17/45]
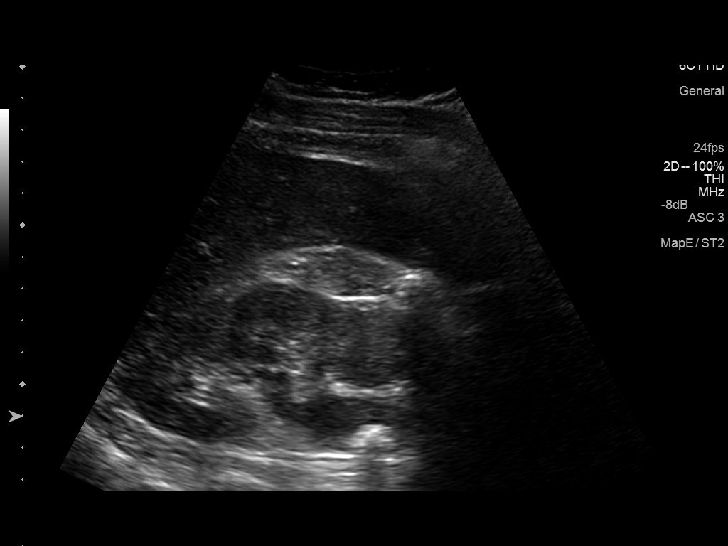
[im 21/45]
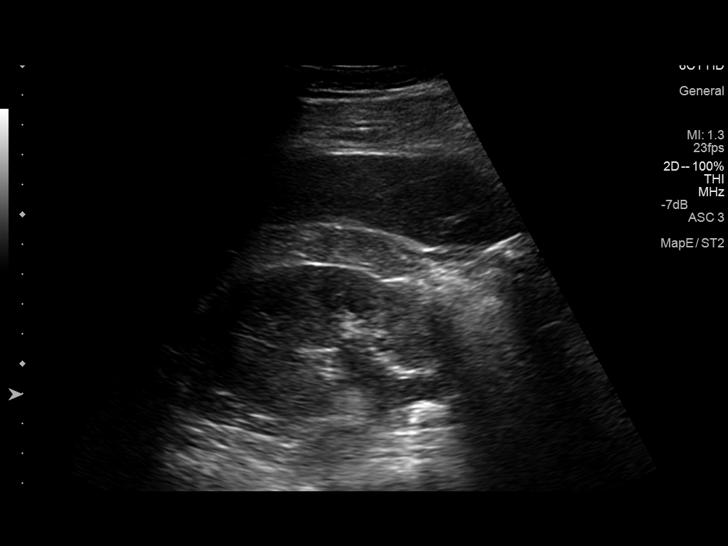
[im 24/45]
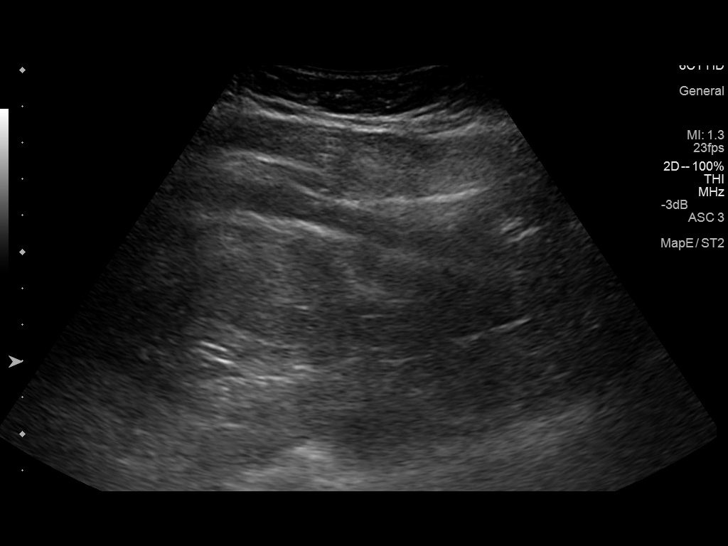
[im 28/45]
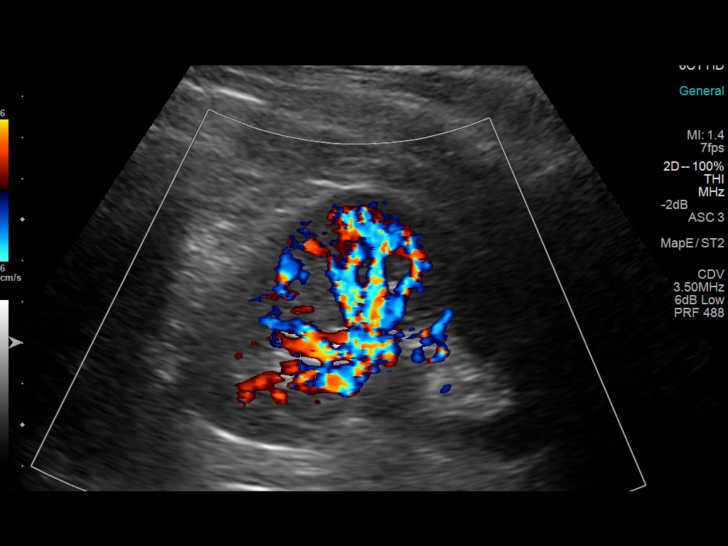
[im 30/45]
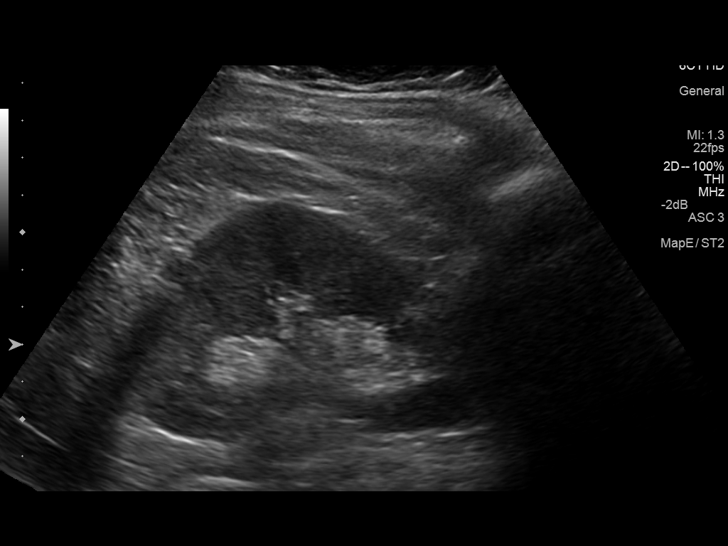
[im 34/45]
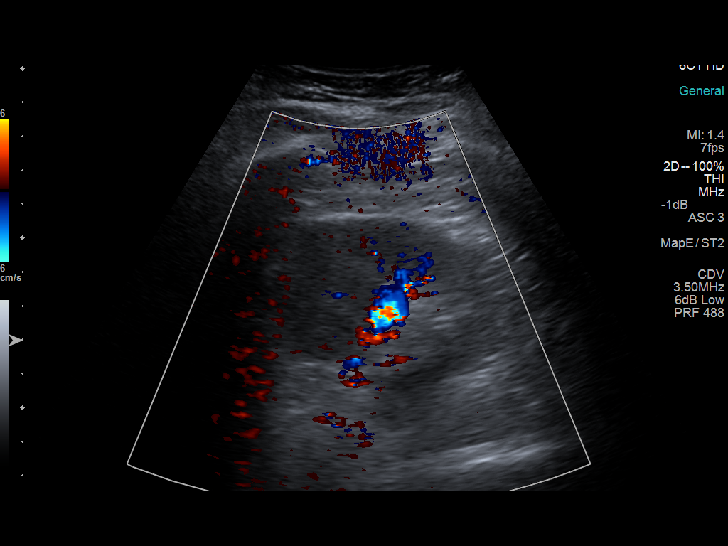
[im 37/45]
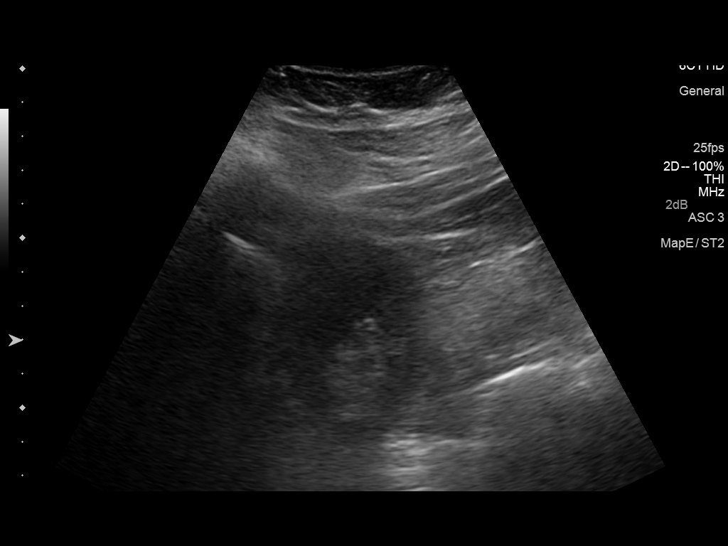
[im 41/45]
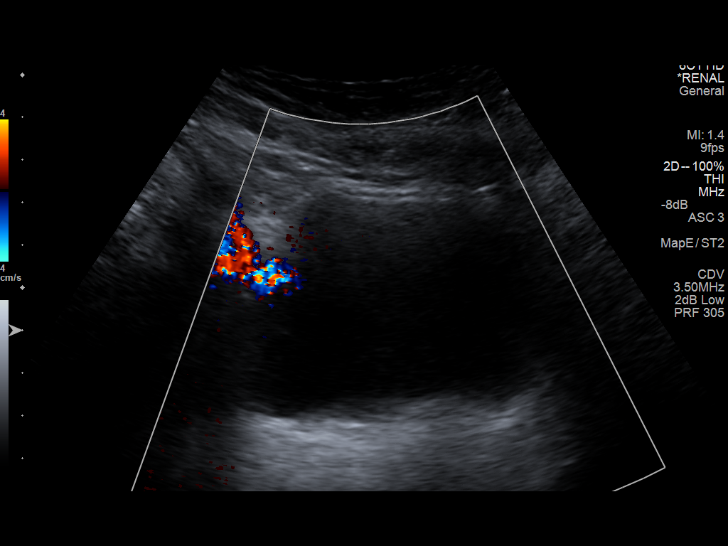
[im 45/45]
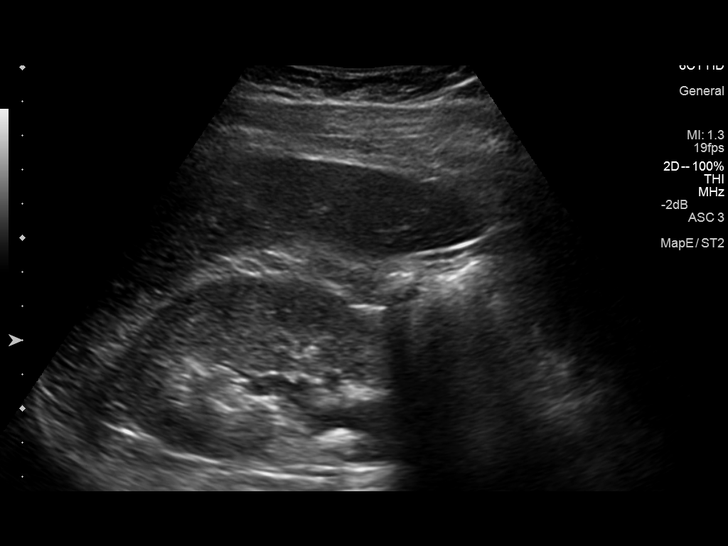

[14 of 25 positions shown; findings below may reference images not displayed]

FINDINGS: Right Kidney:

Length: 10.2 cm.. Mild hydronephrosis is noted. An echogenic focus
is noted within the renal pelvis/proximal right ureter consistent
with migration of previously seen calculus into the proximal right
ureter.

Left Kidney:

Length: 11.0 cm.. Echogenicity within normal limits. No mass or
hydronephrosis visualized.

Bladder:

Appears normal for degree of bladder distention.
IMPRESSION: Echogenic focus within the right renal pelvis/proximal right ureter
with mild hydronephrosis. This is consistent with migration of the
previously seen calculus.

## 2016-05-20 ENCOUNTER — Emergency Department (HOSPITAL_COMMUNITY)
Admission: EM | Admit: 2016-05-20 | Discharge: 2016-05-20 | Disposition: A | Discharge status: Home / Self Care | Payer: Self-pay | Attending: Emergency Medicine | Admitting: Emergency Medicine

## 2016-05-20 ENCOUNTER — Encounter (HOSPITAL_COMMUNITY): Payer: Self-pay

## 2016-05-20 DIAGNOSIS — I10 Essential (primary) hypertension: Secondary | ICD-10-CM | POA: Insufficient documentation

## 2016-05-20 DIAGNOSIS — W3189XA Contact with other specified machinery, initial encounter: Secondary | ICD-10-CM | POA: Insufficient documentation

## 2016-05-20 DIAGNOSIS — S161XXA Strain of muscle, fascia and tendon at neck level, initial encounter: Secondary | ICD-10-CM | POA: Insufficient documentation

## 2016-05-20 DIAGNOSIS — Y9389 Activity, other specified: Secondary | ICD-10-CM | POA: Insufficient documentation

## 2016-05-20 DIAGNOSIS — Z79899 Other long term (current) drug therapy: Secondary | ICD-10-CM | POA: Insufficient documentation

## 2016-05-20 DIAGNOSIS — F17210 Nicotine dependence, cigarettes, uncomplicated: Secondary | ICD-10-CM | POA: Insufficient documentation

## 2016-05-20 DIAGNOSIS — Y9289 Other specified places as the place of occurrence of the external cause: Secondary | ICD-10-CM | POA: Insufficient documentation

## 2016-05-20 DIAGNOSIS — Y999 Unspecified external cause status: Secondary | ICD-10-CM | POA: Insufficient documentation

## 2016-05-20 MED ORDER — IBUPROFEN 800 MG PO TABS: 800.0000 mg | ORAL_TABLET | Freq: Three times a day (TID) | ORAL | 0 refills | Status: DC

## 2016-05-20 MED ORDER — CYCLOBENZAPRINE HCL 7.5 MG PO TABS: 7.5000 mg | ORAL_TABLET | Freq: Two times a day (BID) | ORAL | 0 refills | Status: AC | PRN

## 2016-05-20 NOTE — Discharge Instructions (Signed)
Please make an appointment for the wellness clinic to follow up on your pain and to evaluate your blood pressure as it was elevated (165/105) today.

## 2016-05-20 NOTE — ED Provider Notes (Signed)
[place] DEPT Provider Note   CSN: [id number] Arrival date & time: 05/20/16  1324  By signing my name below, I, Higinio Plan, attest that this documentation has been prepared under the direction and in the presence of Wyn Quaker, [place] . Electronically Signed: Higinio Plan, Scribe. 05/20/2016. 6:40 PM.  History   Chief Complaint Chief Complaint  Patient presents with  . neck pain/back pain   The history is provided by the patient. No language interpreter was used.   HPI Comments: [name] is a 46 y.o. male with PMHx of renal stones and lower back pain, who presents to the Emergency Department complaining of gradually worsening, constant, left-sided neck pain radiating into his upper back that began 3 days ago and worsened this morning. Pt reports he was pulling wet laundry out of a laundry machine while at work 3 days ago and experienced a "strain" in the left side of his neck that has worsened since then. He states his pain is exacerbated when turning his head to the left but notes he is able to move it to the right. Pt reports he took Motrin for his pain last night with no relief. He denies any numbness or weakness in his extremities.   Past Medical History:  Diagnosis Date  .     Patient Active Problem List   Diagnosis Date Noted  . Scalp lump 08/25/2013  . Preventative health care 08/25/2013   Past Surgical History:  Procedure Laterality Date  . DG 3RD DIGIT RIGHT HAND    . DG 4TH DIGIT RIGHT HAND      Home Medications    Prior to Admission medications   Medication Sig Start Date End Date Taking? Authorizing Provider  ciprofloxacin (CIPRO) 500 MG tablet Take 1 tablet (500 mg total) by mouth 2 (two) times daily. Patient not taking: Reported on 10/19/2014 07/17/14   Drenda Freeze, MD  cyclobenzaprine (FEXMID) 7.5 MG tablet Take 1 tablet (7.5 mg total) by mouth 2 (two) times daily as needed for muscle spasms. 05/20/16 05/24/16  Lorin Glass, PA-C  ibuprofen  (ADVIL,MOTRIN) 800 MG tablet Take 1 tablet (800 mg total) by mouth 3 (three) times daily. 05/20/16   Lorin Glass, PA-C  methocarbamol (ROBAXIN) 500 MG tablet Take 1 tablet (500 mg total) by mouth 2 (two) times daily. 09/12/14   Hanna Patel-Mills, PA-C  naproxen (NAPROSYN) 500 MG tablet Take 1 tablet (500 mg total) by mouth 2 (two) times daily. 09/12/14   Hanna Patel-Mills, PA-C  ondansetron (ZOFRAN) 4 MG tablet Take 1 tablet (4 mg total) by mouth every 6 (six) hours. 10/19/14   Stevi Barrett, PA-C  oxyCODONE-acetaminophen (PERCOCET/ROXICET) 5-325 MG per tablet Take 1-2 tablets by mouth every 4 (four) hours as needed for severe pain. 10/19/14   Stevi Barrett, PA-C  simvastatin (ZOCOR) 10 MG tablet Take 1 tablet (10 mg total) by mouth at bedtime. Patient not taking: Reported on 10/19/2014 09/03/13   Tresa Garter, MD   Family History Family History  Problem Relation Age of Onset  . Heart disease Father    Social History Social History  Substance Use Topics  . Smoking status: Current Every Day Smoker    Packs/day: 1.50    Types: Cigarettes  . Smokeless tobacco: Never Used  . Alcohol use 7.2 oz/week    12 Cans of beer per week   Allergies   Patient has no known allergies.  Review of Systems Review of Systems  Musculoskeletal: Positive for back pain  and neck pain.  Neurological: Negative for weakness and numbness.   Physical Exam Updated Vital Signs BP (!) 165/105 (BP Location: Right Arm)   Pulse 77   Temp 98.1 F (36.7 C) (Oral)   Resp 20   SpO2 94%   Physical Exam  Constitutional: He appears well-developed and well-nourished.  HENT:  Head: Normocephalic and atraumatic.  Eyes: Conjunctivae are normal.  Neck: Normal range of motion.  Cardiovascular: Normal rate.   Pulmonary/Chest: Effort normal.  Abdominal: He exhibits no distension.  Musculoskeletal: Normal range of motion. He exhibits edema, tenderness and deformity.  Left cervical paraspinal muscles are in spasm,  tender to palpation. No bony tenderness to spine or L shoulder.  Normal strength in both upper extremities with intact sensation. No deformities, contusions or step offs felt.  Neurological: He is alert.  Skin: Skin is warm.  Psychiatric: He has a normal mood and affect.  Nursing note and vitals reviewed.  ED Treatments / Results  DIAGNOSTIC STUDIES:  Oxygen Saturation is 99% on RA, normal by my interpretation.    COORDINATION OF CARE:  10:14 AM Discussed treatment plan with pt at bedside which includes Flexeril and pt agreed to plan. Pt has acknowledged that he can take Ibuprofen and will be prescribed this medication as well.   Labs (all labs ordered are listed, but only abnormal results are displayed) Labs Reviewed - No data to display  EKG  EKG Interpretation None      Radiology No results found.  Procedures Procedures (including critical care time)  Medications Ordered in ED Medications - No data to display  Initial Impression / Assessment and Plan / ED Course  I have reviewed the triage vital signs and the nursing notes.  Pertinent labs & imaging results that were available during my care of the patient were reviewed by me and considered in my medical decision making (see chart for details).    Patient with left-sided neck/back pain. No neurological deficits and normal neuro exam. Radiology is not indicated based on history, physical exam and history/mechanism of injury.  Patient is ambulatory.  Supportive care and return precaution discussed. Appears safe for discharge at this time. Follow up as indicated in discharge paperwork.   Patient will be given rx for flexeril.  Patient was advised that this medication may make them sleepy and impaired.  Patient was instructed not to drive, operate heavy machinery, etc for 18 hours after taking the medication or if they feel tired or impaired.   Patient noted to be hypertensive in the emergency department.  No signs of  hypertensive urgency.  Discussed with patient the need for close follow-up and management by their primary care physician.   I personally performed the services described in this documentation, which was scribed in my presence. The recorded information has been reviewed and is accurate.   Final Clinical Impressions(s) / ED Diagnoses   Final diagnoses:  Neck strain, initial encounter  Hypertension, unspecified type    Allergies as of 05/20/2016   No Known Allergies     Medication List    TAKE these medications   cyclobenzaprine 7.5 MG tablet Commonly known as:  FEXMID Take 1 tablet (7.5 mg total) by mouth 2 (two) times daily as needed for muscle spasms. What changed:  medication strength  how much to take   ibuprofen 800 MG tablet Commonly known as:  ADVIL,MOTRIN Take 1 tablet (800 mg total) by mouth 3 (three) times daily. What changed:  medication strength  how  much to take  when to take this  reasons to take this     ASK your doctor about these medications   ciprofloxacin 500 MG tablet Commonly known as:  CIPRO Take 1 tablet (500 mg total) by mouth 2 (two) times daily.   methocarbamol 500 MG tablet Commonly known as:  ROBAXIN Take 1 tablet (500 mg total) by mouth 2 (two) times daily.   naproxen 500 MG tablet Commonly known as:  NAPROSYN Take 1 tablet (500 mg total) by mouth 2 (two) times daily.   ondansetron 4 MG tablet Commonly known as:  ZOFRAN Take 1 tablet (4 mg total) by mouth every 6 (six) hours.   oxyCODONE-acetaminophen 5-325 MG tablet Commonly known as:  PERCOCET/ROXICET Take 1-2 tablets by mouth every 4 (four) hours as needed for severe pain.   simvastatin 10 MG tablet Commonly known as:  ZOCOR Take 1 tablet (10 mg total) by mouth at bedtime.         Lorin Glass, PA-C 05/20/16 [place], DO 05/21/16 [phone]

## 2016-05-20 NOTE — ED Triage Notes (Signed)
Patient complains of left sided neck and upper back since Wednesday. States he was pulling clothes that were wet out of machine at work and strained neck. Has not taken any otc meds

## 2016-07-12 ENCOUNTER — Emergency Department (HOSPITAL_COMMUNITY)
Admission: EM | Admit: 2016-07-12 | Discharge: 2016-07-12 | Disposition: A | Discharge status: Home / Self Care | Payer: Self-pay | Attending: Emergency Medicine | Admitting: Emergency Medicine

## 2016-07-12 ENCOUNTER — Encounter (HOSPITAL_COMMUNITY): Payer: Self-pay | Admitting: *Deleted

## 2016-07-12 DIAGNOSIS — X500XXA Overexertion from strenuous movement or load, initial encounter: Secondary | ICD-10-CM | POA: Insufficient documentation

## 2016-07-12 DIAGNOSIS — M5441 Lumbago with sciatica, right side: Secondary | ICD-10-CM | POA: Insufficient documentation

## 2016-07-12 DIAGNOSIS — Y9389 Activity, other specified: Secondary | ICD-10-CM | POA: Insufficient documentation

## 2016-07-12 DIAGNOSIS — Y929 Unspecified place or not applicable: Secondary | ICD-10-CM | POA: Insufficient documentation

## 2016-07-12 DIAGNOSIS — S39012A Strain of muscle, fascia and tendon of lower back, initial encounter: Secondary | ICD-10-CM | POA: Insufficient documentation

## 2016-07-12 DIAGNOSIS — Y990 Civilian activity done for income or pay: Secondary | ICD-10-CM | POA: Insufficient documentation

## 2016-07-12 DIAGNOSIS — F17210 Nicotine dependence, cigarettes, uncomplicated: Secondary | ICD-10-CM | POA: Insufficient documentation

## 2016-07-12 DIAGNOSIS — N289 Disorder of kidney and ureter, unspecified: Secondary | ICD-10-CM | POA: Insufficient documentation

## 2016-07-12 MED ORDER — ACETAMINOPHEN 325 MG PO TABS
650.0000 mg | ORAL_TABLET | Freq: Once | ORAL | Status: AC
  Administered 2016-07-12: 650 mg via ORAL
  Filled 2016-07-12: qty 2

## 2016-07-12 MED ORDER — NAPROXEN 250 MG PO TABS: 250.0000 mg | ORAL_TABLET | Freq: Two times a day (BID) | ORAL | 0 refills | Status: DC

## 2016-07-12 MED ORDER — METHOCARBAMOL 500 MG PO TABS: 500.0000 mg | ORAL_TABLET | Freq: Two times a day (BID) | ORAL | 0 refills | Status: DC | PRN

## 2016-07-12 NOTE — ED Triage Notes (Signed)
Pt states R side and R lower back pain ever since he lifted a wet bag of clothes at work.  States hx of same.  Pain increases with movement.  Denies nvd or bowel/bladder s/s.

## 2016-07-12 NOTE — ED Provider Notes (Signed)
[place] DEPT Provider Note   CSN: [id number] Arrival date & time: 07/12/16  1345  By signing my name below, I, Collene Leyden, attest that this documentation has been prepared under the direction and in the presence of Waynetta Pean, PA-C Electronically Signed: Collene Leyden, Scribe. 07/12/16. 3:00 PM.  History   Chief Complaint Chief Complaint  Patient presents with  . Back Pain  . Flank Pain    HPI Comments: [name] is a 46 y.o. male with no pertinent past medical history, who presents to the Emergency Department complaining of right lower back pain that began yesterday. Patient states he was working, when he picked up heavy thermal blankets, injuring his back. He reports pain that is worse to his right low back. No medications taken prior to arrival. Patient states his pain is worse with coughing and movement. He has had no increased coughing. Paitent is ambulatory in the emergency department. No treatments attempted prior to arrival. Patient denies any IV drug use. Patient denies any loss of bowel/bladder control, numbness, tingling, weakness, fever, chills, nausea, dysuria, hematuria, or vomiting.   The history is provided by the patient. No language interpreter was used.    Past Medical History:  Diagnosis Date  . Renal disorder     Patient Active Problem List   Diagnosis Date Noted  . Scalp lump 08/25/2013  . Preventative health care 08/25/2013    Past Surgical History:  Procedure Laterality Date  . DG 3RD DIGIT RIGHT HAND    . DG 4TH DIGIT RIGHT HAND         Home Medications    Prior to Admission medications   Medication Sig Start Date End Date Taking? Authorizing Provider  ciprofloxacin (CIPRO) 500 MG tablet Take 1 tablet (500 mg total) by mouth 2 (two) times daily. Patient not taking: Reported on 10/19/2014 07/17/14   Drenda Freeze, MD  methocarbamol (ROBAXIN) 500 MG tablet Take 1 tablet (500 mg total) by mouth 2 (two) times daily as needed for  muscle spasms. 07/12/16   Waynetta Pean, PA-C  naproxen (NAPROSYN) 250 MG tablet Take 1 tablet (250 mg total) by mouth 2 (two) times daily with a meal. 07/12/16   Waynetta Pean, PA-C  ondansetron (ZOFRAN) 4 MG tablet Take 1 tablet (4 mg total) by mouth every 6 (six) hours. 10/19/14   Barrett, Lahoma Crocker, PA-C  oxyCODONE-acetaminophen (PERCOCET/ROXICET) 5-325 MG per tablet Take 1-2 tablets by mouth every 4 (four) hours as needed for severe pain. 10/19/14   Barrett, Lahoma Crocker, PA-C  simvastatin (ZOCOR) 10 MG tablet Take 1 tablet (10 mg total) by mouth at bedtime. Patient not taking: Reported on 10/19/2014 09/03/13   Tresa Garter, MD    Family History Family History  Problem Relation Age of Onset  . Heart disease Father     Social History Social History  Substance Use Topics  . Smoking status: Current Every Day Smoker    Packs/day: 0.50    Types: Cigarettes  . Smokeless tobacco: Never Used  . Alcohol use 7.2 oz/week    12 Cans of beer per week     Allergies   Patient has no known allergies.   Review of Systems Review of Systems  Constitutional: Negative for chills and fever.  Gastrointestinal: Negative for abdominal pain, diarrhea, nausea, rectal pain and vomiting.  Genitourinary: Negative for difficulty urinating, dysuria, hematuria and testicular pain.  Musculoskeletal: Positive for back pain. Negative for gait problem.  Skin: Negative for rash and wound.  Neurological: Negative  for weakness and numbness.     Physical Exam Updated Vital Signs BP (!) 156/88 (BP Location: Left Arm)   Pulse 65   Temp 97.8 F (36.6 C)   Resp 16   Ht 5\' 11"  (1.803 m)   Wt 107.5 kg (237 lb)   SpO2 96%   BMI 33.05 kg/m   Physical Exam  Constitutional: He appears well-developed and well-nourished. No distress.  Nontoxic appearing.  HENT:  Head: Normocephalic and atraumatic.  Eyes: Right eye exhibits no discharge. Left eye exhibits no discharge.  Neck: Neck supple.  Cardiovascular:  Normal rate, regular rhythm, normal heart sounds and intact distal pulses.   Pulmonary/Chest: Effort normal and breath sounds normal. No respiratory distress.  Abdominal: Soft. Bowel sounds are normal. There is no tenderness. There is no guarding.  No CVA or flank tenderness.   Musculoskeletal: Normal range of motion. He exhibits tenderness. He exhibits no edema or deformity.  Bilateral DTRs are intact. No midline back tenderness to palpation. Right low back muscular tenderness to palpation. No back erythema, deformity or ecchymosis.  Neurological: He is alert. He displays normal reflexes. No sensory deficit. He exhibits normal muscle tone. Coordination normal.  Normal gait. Bilateral patellar DTRs are intact. Sensation is intact his bilateral lower extremities.  Skin: Skin is warm and dry. Capillary refill takes less than 2 seconds. No rash noted. He is not diaphoretic. No erythema. No pallor.  Psychiatric: He has a normal mood and affect. His behavior is normal.  Nursing note and vitals reviewed.    ED Treatments / Results  DIAGNOSTIC STUDIES: Oxygen Saturation is 96% on RA, adequate by my interpretation.    COORDINATION OF CARE: 2:58 PM Discussed treatment plan with pt at bedside and pt agreed to plan, which includes naproxen and robaxin.   Labs (all labs ordered are listed, but only abnormal results are displayed) Labs Reviewed - No data to display  EKG  EKG Interpretation None       Radiology No results found.  Procedures Procedures (including critical care time)  Medications Ordered in ED Medications  acetaminophen (TYLENOL) tablet 650 mg (not administered)     Initial Impression / Assessment and Plan / ED Course  I have reviewed the triage vital signs and the nursing notes.  Pertinent labs & imaging results that were available during my care of the patient were reviewed by me and considered in my medical decision making (see chart for details).     This is a  46 y.o. male with no pertinent past medical history, who presents to the Emergency Department complaining of right lower back pain that began yesterday. Patient states he was working, when he picked up heavy thermal blankets, injuring his back. He reports pain that is worse to his right low back. No medications taken prior to arrival. Patient states his pain is worse with coughing and movement. He has had no increased coughing. Paitent is ambulatory in the emergency department. On exam patient is afebrile and nontoxic appearing. Patient with right low back pain.  No neurological deficits and normal neuro exam.  Patient can walk but states is painful.  No loss of bowel or bladder control.  No concern for cauda equina.  No fever, night sweats, weight loss, h/o cancer, IVDU.  Will discharge with prescriptions for naproxen and Robaxin. Back exercises discussed. Return precautions discussed. I advised the patient to follow-up with their primary care provider this week. I advised the patient to return to the emergency department with  new or worsening symptoms or new concerns. The patient verbalized understanding and agreement with plan.      Final Clinical Impressions(s) / ED Diagnoses   Final diagnoses:  Acute right-sided low back pain with right-sided sciatica  Strain of lumbar region, initial encounter    New Prescriptions New Prescriptions   METHOCARBAMOL (ROBAXIN) 500 MG TABLET    Take 1 tablet (500 mg total) by mouth 2 (two) times daily as needed for muscle spasms.   NAPROXEN (NAPROSYN) 250 MG TABLET    Take 1 tablet (250 mg total) by mouth 2 (two) times daily with a meal.   I personally performed the services described in this documentation, which was scribed in my presence. The recorded information has been reviewed and is accurate.      Waynetta Pean, PA-C 07/12/16 1512    Davonna Belling, MD 07/12/16 1626

## 2016-10-28 ENCOUNTER — Emergency Department (HOSPITAL_COMMUNITY)
Admission: EM | Admit: 2016-10-28 | Discharge: 2016-10-28 | Disposition: A | Discharge status: Home / Self Care | Payer: No Typology Code available for payment source | Attending: Emergency Medicine | Admitting: Emergency Medicine

## 2016-10-28 ENCOUNTER — Encounter (HOSPITAL_COMMUNITY): Payer: Self-pay | Admitting: Emergency Medicine

## 2016-10-28 DIAGNOSIS — M25531 Pain in right wrist: Secondary | ICD-10-CM | POA: Diagnosis present

## 2016-10-28 DIAGNOSIS — Z5321 Procedure and treatment not carried out due to patient leaving prior to being seen by health care provider: Secondary | ICD-10-CM | POA: Diagnosis not present
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
## 2016-10-28 NOTE — ED Notes (Addendum)
Pt called for a room, no response 

## 2016-10-28 NOTE — ED Notes (Signed)
Called pt for room no answer 

## 2016-10-28 NOTE — ED Notes (Signed)
Called pt no answer °

## 2017-10-04 ENCOUNTER — Encounter (HOSPITAL_COMMUNITY): Payer: Self-pay

## 2017-10-04 ENCOUNTER — Emergency Department (HOSPITAL_COMMUNITY): Payer: Self-pay

## 2017-10-04 ENCOUNTER — Emergency Department (HOSPITAL_COMMUNITY)
Admission: EM | Admit: 2017-10-04 | Discharge: 2017-10-04 | Payer: Self-pay | Attending: Emergency Medicine | Admitting: Emergency Medicine

## 2017-10-04 DIAGNOSIS — F17210 Nicotine dependence, cigarettes, uncomplicated: Secondary | ICD-10-CM | POA: Insufficient documentation

## 2017-10-04 DIAGNOSIS — F10229 Alcohol dependence with intoxication, unspecified: Secondary | ICD-10-CM | POA: Insufficient documentation

## 2017-10-04 DIAGNOSIS — Y9389 Activity, other specified: Secondary | ICD-10-CM | POA: Insufficient documentation

## 2017-10-04 DIAGNOSIS — Y929 Unspecified place or not applicable: Secondary | ICD-10-CM | POA: Insufficient documentation

## 2017-10-04 DIAGNOSIS — F10920 Alcohol use, unspecified with intoxication, uncomplicated: Secondary | ICD-10-CM

## 2017-10-04 DIAGNOSIS — Y999 Unspecified external cause status: Secondary | ICD-10-CM | POA: Insufficient documentation

## 2017-10-04 DIAGNOSIS — S0083XA Contusion of other part of head, initial encounter: Secondary | ICD-10-CM | POA: Insufficient documentation

## 2017-10-04 DIAGNOSIS — S025XXA Fracture of tooth (traumatic), initial encounter for closed fracture: Secondary | ICD-10-CM | POA: Insufficient documentation

## 2017-10-04 LAB — CBC
HCT: 45.4 % (ref 39.0–52.0)
Hemoglobin: 15.3 g/dL (ref 13.0–17.0)
MCH: 31.9 pg (ref 26.0–34.0)
MCHC: 33.7 g/dL (ref 30.0–36.0)
MCV: 94.6 fL (ref 78.0–100.0)
PLATELETS: 380 10*3/uL (ref 150–400)
RBC: 4.8 MIL/uL (ref 4.22–5.81)
RDW: 12.5 % (ref 11.5–15.5)
WBC: 11.7 10*3/uL — AB (ref 4.0–10.5)

## 2017-10-04 LAB — BASIC METABOLIC PANEL
Anion gap: 18 — ABNORMAL HIGH (ref 5–15)
BUN: 6 mg/dL (ref 6–20)
CALCIUM: 9.2 mg/dL (ref 8.9–10.3)
CO2: 18 mmol/L — ABNORMAL LOW (ref 22–32)
Chloride: 104 mmol/L (ref 98–111)
Creatinine, Ser: 1.05 mg/dL (ref 0.61–1.24)
GFR calc Af Amer: >60 mL/min (ref >60)
Glucose, Bld: 138 mg/dL — ABNORMAL HIGH (ref 70–99)
Potassium: 3.9 mmol/L (ref 3.5–5.1)
SODIUM: 140 mmol/L (ref 135–145)

## 2017-10-04 LAB — ETHANOL: ALCOHOL ETHYL (B): 184 mg/dL — AB (ref <10)

## 2017-10-04 MED ORDER — ACETAMINOPHEN 325 MG PO TABS
650.0000 mg | ORAL_TABLET | Freq: Once | ORAL | Status: AC
  Administered 2017-10-04: 650 mg via ORAL
  Filled 2017-10-04: qty 2

## 2017-10-04 NOTE — ED Notes (Signed)
Pt returned from CT °

## 2017-10-04 NOTE — Discharge Instructions (Signed)
Please read and follow all provided instructions.  Your diagnoses today include:  1. Contusion of face, initial encounter   2. Closed avulsion fracture of tooth, initial encounter   3. Alcoholic intoxication without complication ([place])     Tests performed today include:  CT scan of your head, face, and neck that did not show any serious injuries other than broken teeth.   Blood counts and electrolytes  Vital signs. See below for your results today.   Medications prescribed:   None  Take any prescribed medications only as directed.  Home care instructions:  Follow any educational materials contained in this packet.  Follow-up instructions: Please follow-up with your primary care provider in the next 3 days for further evaluation of your symptoms.   Return instructions:  SEEK IMMEDIATE MEDICAL ATTENTION IF:  There is confusion or drowsiness (although children frequently become drowsy after injury).   You cannot awaken the injured person.   You have more than one episode of vomiting.   You notice dizziness or unsteadiness which is getting worse, or inability to walk.   You have convulsions or unconsciousness.   You experience severe, persistent headaches not relieved by Tylenol.  You cannot use arms or legs normally.   There are changes in pupil sizes. (This is the black center in the colored part of the eye)   There is clear or bloody discharge from the nose or ears.   You have change in speech, vision, swallowing, or understanding.   Localized weakness, numbness, tingling, or change in bowel or bladder control.  You have any other emergent concerns.  Additional Information: You have had a head injury which does not appear to require admission at this time.  Your vital signs today were: BP (!) 147/80    Pulse 84    Resp 18    SpO2 94%  If your blood pressure (BP) was elevated above 135/85 this visit, please have this repeated by your doctor within one  month. --------------
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
## 2017-11-23 ENCOUNTER — Encounter: Payer: Self-pay | Admitting: Internal Medicine

## 2017-11-23 ENCOUNTER — Ambulatory Visit: Payer: Self-pay | Attending: Internal Medicine | Admitting: Internal Medicine

## 2017-11-23 VITALS — BP 168/113 | HR 77 | Temp 98.2°F | Resp 16 | Ht 71.0 in | Wt 256.0 lb

## 2017-11-23 DIAGNOSIS — Z23 Encounter for immunization: Secondary | ICD-10-CM

## 2017-11-23 DIAGNOSIS — G4452 New daily persistent headache (NDPH): Secondary | ICD-10-CM | POA: Insufficient documentation

## 2017-11-23 DIAGNOSIS — K029 Dental caries, unspecified: Secondary | ICD-10-CM | POA: Insufficient documentation

## 2017-11-23 DIAGNOSIS — Z131 Encounter for screening for diabetes mellitus: Secondary | ICD-10-CM | POA: Insufficient documentation

## 2017-11-23 DIAGNOSIS — Z8249 Family history of ischemic heart disease and other diseases of the circulatory system: Secondary | ICD-10-CM | POA: Insufficient documentation

## 2017-11-23 DIAGNOSIS — I10 Essential (primary) hypertension: Secondary | ICD-10-CM | POA: Insufficient documentation

## 2017-11-23 DIAGNOSIS — F17210 Nicotine dependence, cigarettes, uncomplicated: Secondary | ICD-10-CM | POA: Insufficient documentation

## 2017-11-23 DIAGNOSIS — Z2821 Immunization not carried out because of patient refusal: Secondary | ICD-10-CM

## 2017-11-23 MED ORDER — AMLODIPINE BESYLATE 5 MG PO TABS: 7.5000 mg | ORAL_TABLET | Freq: Every day | ORAL | 3 refills | Status: DC

## 2017-11-23 MED ORDER — TRAMADOL HCL 50 MG PO TABS: 50.0000 mg | ORAL_TABLET | Freq: Four times a day (QID) | ORAL | 0 refills | Status: DC | PRN

## 2017-11-23 MED ORDER — TETANUS-DIPHTH-ACELL PERTUSSIS 5-2.5-18.5 LF-MCG/0.5 IM SUSP: 0.5000 mL | Freq: Once | INTRAMUSCULAR | 0 refills | Status: AC

## 2017-11-23 MED ORDER — AMOXICILLIN 500 MG PO CAPS: 500.0000 mg | ORAL_CAPSULE | Freq: Three times a day (TID) | ORAL | 0 refills | Status: DC

## 2017-11-23 MED FILL — AMLODIPINE BESYLATE 5 MG TA: 5 | 30 days supply | Qty: 45 | Fill #0

## 2017-11-23 MED FILL — AMOXICILLIN 500 MG CAPSULE: 500 | 10 days supply | Qty: 30 | Fill #0

## 2017-11-23 MED FILL — traMADol HCL 50 MG TABS: 50 | 5 days supply | Qty: 20 | Fill #0

## 2017-11-23 NOTE — Progress Notes (Signed)
Pt states he is also having tooth pain  Pt wife states he has bene going to the blood bank lately and his blood pressure has been reading high

## 2017-11-23 NOTE — Patient Instructions (Addendum)
Please give appointment with clinical pharmacist in 2 to 3 weeks for repeat blood pressure check.  Td Vaccine (Tetanus and Diphtheria): What You Need to Know 1. Why get vaccinated? Tetanus  and diphtheria are very serious diseases. They are rare in the [place] today, but people who do become infected often have severe complications. Td vaccine is used to protect adolescents and adults from both of these diseases. Both tetanus and diphtheria are infections caused by bacteria. Diphtheria spreads from person to person through coughing or sneezing. Tetanus-causing bacteria enter the body through cuts, scratches, or wounds. TETANUS (lockjaw) causes painful muscle tightening and stiffness, usually all over the body.  It can lead to tightening of muscles in the head and neck so you can't open your mouth, swallow, or sometimes even breathe. Tetanus kills about 1 out of every 10 people who are infected even after receiving the best medical care.  DIPHTHERIA can cause a thick coating to form in the back of the throat.  It can lead to breathing problems, paralysis, heart failure, and death.  Before vaccines, as many as 200,000 cases of diphtheria and hundreds of cases of tetanus were reported in the [place] each year. Since vaccination began, reports of cases for both diseases have dropped by about 99%. 2. Td vaccine Td vaccine can protect adolescents and adults from tetanus and diphtheria. Td is usually given as a booster dose every 10 years but it can also be given earlier after a severe and dirty wound or burn. Another vaccine, called Tdap, which protects against pertussis in addition to tetanus and diphtheria, is sometimes recommended instead of Td vaccine. Your doctor or the person giving you the vaccine can give you more information. Td may safely be given at the same time as other vaccines. 3. Some people should not get this vaccine  A person who has ever had a life-threatening allergic  reaction after a previous dose of any tetanus or diphtheria containing vaccine, OR has a severe allergy to any part of this vaccine, should not get Td vaccine. Tell the person giving the vaccine about any severe allergies.  Talk to your doctor if you: ? had severe pain or swelling after any vaccine containing diphtheria or tetanus, ? ever had a condition called Guillain Barre Syndrome (GBS), ? aren't feeling well on the day the shot is scheduled. 4. What are the risks from Td vaccine? With any medicine, including vaccines, there is a chance of side effects. These are usually mild and go away on their own. Serious reactions are also possible but are rare. Most people who get Td vaccine do not have any problems with it. Mild problems following Td vaccine: (Did not interfere with activities)  Pain where the shot was given (about 8 people in 10)  Redness or swelling where the shot was given (about 1 person in 4)  Mild fever (rare)  Headache (about 1 person in 4)  Tiredness (about 1 person in 4)  Moderate problems following Td vaccine: (Interfered with activities, but did not require medical attention)  Fever over 102F (rare)  Severe problems following Td vaccine: (Unable to perform usual activities; required medical attention)  Swelling, severe pain, bleeding and/or redness in the arm where the shot was given (rare).  Problems that could happen after any vaccine:  People sometimes faint after a medical procedure, including vaccination. Sitting or lying down for about 15 minutes can help prevent fainting, and injuries caused by a fall. Tell your doctor  if you feel dizzy, or have vision changes or ringing in the ears.  Some people get severe pain in the shoulder and have difficulty moving the arm where a shot was given. This happens very rarely.  Any medication can cause a severe allergic reaction. Such reactions from a vaccine are very rare, estimated at fewer than 1 in a million  doses, and would happen within a few minutes to a few hours after the vaccination. As with any medicine, there is a very remote chance of a vaccine causing a serious injury or death. The safety of vaccines is always being monitored. For more information, visit: http://www.aguilar.org/ 5. What if there is a serious reaction? What should I look for? Look for anything that concerns you, such as signs of a severe allergic reaction, very high fever, or unusual behavior. Signs of a severe allergic reaction can include hives, swelling of the face and throat, difficulty breathing, a fast heartbeat, dizziness, and weakness. These would usually start a few minutes to a few hours after the vaccination. What should I do?  If you think it is a severe allergic reaction or other emergency that can't wait, call 9-1-1 or get the person to the nearest hospital. Otherwise, call your doctor.  Afterward, the reaction should be reported to the Vaccine Adverse Event Reporting System (VAERS). Your doctor might file this report, or you can do it yourself through the VAERS web site at www.vaers.SamedayNews.es, or by calling (626) 035-5051. ? VAERS does not give medical advice. 6. The National Vaccine Injury Compensation Program The [employer] Vaccine Injury Compensation Program (VICP) is a federal program that was created to compensate people who may have been injured by certain vaccines. Persons who believe they may have been injured by a vaccine can learn about the program and about filing a claim by calling 319-691-9424 or visiting the [place] website at GoldCloset.com.ee. There is a time limit to file a claim for compensation. 7. How can I learn more?  Ask your doctor. He or she can give you the vaccine package insert or suggest other sources of information.  Call your local or state health department.  Contact the Centers for Disease Control and Prevention (CDC): ? Call 743 214 2678  (1-800-CDC-INFO) ? Visit CDC's website at http://hunter.com/ CDC Td Vaccine VIS (05/11/15) This information is not intended to replace advice given to you by your health care provider. Make sure you discuss any questions you have with your health care provider. Document Released: 11/13/2005 Document Revised: 10/07/2015 Document Reviewed: 10/07/2015 Elsevier Interactive Patient Education  2017 [employer].

## 2017-11-23 NOTE — Progress Notes (Signed)
Patient ID: ESPEN BETHEL, male    DOB: [birth year]  MRN: [id number]  CC: New Patient (Initial Visit)   Subjective: Daylan Juhnke is a 47 y.o. male who presents to re-est care.  He was last seen here in 2015.  Wife Ellison Hughs, is with him. His concerns today include:   Pt concern about BP. He donates plasma about once every 1-2 weeks.  3 times over the past 3 weeks when he went to donate, his blood pressure was elevated.  They did not allow him to donate the last 2 times because of this. -He denies any prior history of hypertension.  He was in the emergency room last month and blood pressure was noted to be elevated then. Family history of HTN in his father who is deceased. Over the past 2 weeks he has been taking large amounts of Motrin because of dental pain in the left upper and lower jaw.  He purchased the 220 mg motrin and has been taking 10 of them every 3-4 hours because of dental pain. -endorses headaches x 3 wks, worse over past 1.5 wks Frontal and parietal areas +nausea but not in past 3 days, dizziness at times.  ++ photophobia + problem seeing small letters.   Better laying down Headaches can last 1.5 days.  Motrin relieves HA in 30 mins  Tob dep:  10 cig/day since age 77.  Quit off and on over the years.  Longest time was for 1 yr.  Not ready to quit Denies street drug use. Drinks 3  40 oz beers every Friday.  Patient Active Problem List   Diagnosis Date Noted  . Scalp lump 08/25/2013  . Preventative health care 08/25/2013     No current outpatient medications on file prior to visit.   No current facility-administered medications on file prior to visit.     No Known Allergies  Social History   Socioeconomic History  . Marital status: Single    Spouse name: Not on file  . Number of children: Not on file  . Years of education: Not on file  . Highest education level: Not on file  Occupational History  . Not on file  Social Needs  . Financial resource strain: Not  on file  . Food insecurity:    Worry: Not on file    Inability: Not on file  . Transportation needs:    Medical: Not on file    Non-medical: Not on file  Tobacco Use  . Smoking status: Current Every Day Smoker    Packs/day: 0.50    Types: Cigarettes  . Smokeless tobacco: Never Used  Substance and Sexual Activity  . Alcohol use: Yes    Alcohol/week: 12.0 standard drinks    Types: 12 Cans of beer per week  . Drug use: No  . Sexual activity: Never  Lifestyle  . Physical activity:    Days per week: Not on file    Minutes per session: Not on file  . Stress: Not on file  Relationships  . Social connections:    Talks on phone: Not on file    Gets together: Not on file    Attends religious service: Not on file    Active member of club or organization: Not on file    Attends meetings of clubs or organizations: Not on file    Relationship status: Not on file  . Intimate partner violence:    Fear of current or ex partner: Not on file  Emotionally abused: Not on file    Physically abused: Not on file    Forced sexual activity: Not on file  Other Topics Concern  . Not on file  Social History Narrative  . Not on file    Family History  Problem Relation Age of Onset  . Heart disease Father     Past Surgical History:  Procedure Laterality Date  . DG 3RD DIGIT RIGHT HAND    . DG 4TH DIGIT RIGHT HAND      ROS: Review of Systems Negative except as above  PHYSICAL EXAM: BP (!) 168/113 (BP Location: Right Arm, Cuff Size: Large) Comment: recheck  Pulse 77   Temp 98.2 F (36.8 C) (Oral)   Resp 16   Ht 5\' 11"  (1.803 m)   Wt 256 lb (116.1 kg)   SpO2 95%   BMI 35.70 kg/m   Physical Exam BP 150/100 General appearance - alert, well appearing, middle-aged African-American male and in no distress Mental status - normal mood, behavior, speech, dress, motor activity, and thought processes Mouth: Numerous decayed teeth broken off in the gum.  Mild inflammation around decayed  left lower wisdom tooth and left upper first molar Neck - supple, no significant adenopathy Chest - clear to auscultation, no wheezes, rales or rhonchi, symmetric air entry Heart - normal rate, regular rhythm, normal S1, S2, no murmurs, rubs, clicks or gallops Neurological - cranial nerves II through XII intact, motor and sensory grossly normal bilaterally, Romberg sign negative, normal gait and station Extremities - peripheral pulses normal, no pedal edema, no clubbing or cyanosis   ASSESSMENT AND PLAN: 1. Dental cavities He does have some inflammation around the teeth that are causing him pain.  I have placed him on some antibiotics and given a limited supply of tramadol to use as needed.  Advised that he discontinue taking so much Motrin as it will damage his kidneys.  Recommend that he sees a dentist as soon as possible.  2. Influenza vaccination declined   3. Need for Tdap vaccination   4. Essential hypertension The amount of ibuprofen/Motrin that he is taking may be contributing to the elevated blood pressure so asked him to discontinue taking it.  We will start him on amlodipine.  Low-salt diet advised.  We will have him follow-up with our clinical pharmacist in about 2 weeks for repeat blood pressure check. - Comprehensive metabolic panel - Lipid panel  5. New daily persistent headache Differential diagnoses include migraines versus secondary to elevated blood pressure versus rebound headaches from increase use of over-the-counter medication We will see if headaches decreased with better control of blood pressure and stopping ibuprofen  6. Diabetes mellitus screening - Hemoglobin A1c    Patient was given the opportunity to ask questions.  Patient verbalized understanding of the plan and was able to repeat key elements of the plan.   No orders of the defined types were placed in this encounter.    Requested Prescriptions    No prescriptions requested or ordered in this  encounter    No follow-ups on file.  Karle Plumber, MD, FACP

## 2017-11-24 LAB — LIPID PANEL
CHOLESTEROL TOTAL: 279 mg/dL — AB (ref 100–199)
Chol/HDL Ratio: 6.8 ratio — ABNORMAL HIGH (ref 0.0–5.0)
HDL: 41 mg/dL (ref >39)
LDL CALC: 166 mg/dL — AB (ref 0–99)
TRIGLYCERIDES: 359 mg/dL — AB (ref 0–149)
VLDL Cholesterol Cal: 72 mg/dL — ABNORMAL HIGH (ref 5–40)

## 2017-11-24 LAB — COMPREHENSIVE METABOLIC PANEL
ALK PHOS: 69 IU/L (ref 39–117)
ALT: 26 IU/L (ref 0–44)
AST: 18 IU/L (ref 0–40)
Albumin/Globulin Ratio: 1.8 (ref 1.2–2.2)
Albumin: 4.7 g/dL (ref 3.5–5.5)
BUN/Creatinine Ratio: 11 (ref 9–20)
BUN: 11 mg/dL (ref 6–24)
Bilirubin Total: 0.3 mg/dL (ref 0.0–1.2)
CALCIUM: 10.2 mg/dL (ref 8.7–10.2)
CO2: 22 mmol/L (ref 20–29)
CREATININE: 1.03 mg/dL (ref 0.76–1.27)
Chloride: 101 mmol/L (ref 96–106)
GFR calc Af Amer: 101 mL/min/{1.73_m2} (ref >59)
GFR calc non Af Amer: 87 mL/min/{1.73_m2} (ref >59)
GLOBULIN, TOTAL: 2.6 g/dL (ref 1.5–4.5)
GLUCOSE: 91 mg/dL (ref 65–99)
Potassium: 4.9 mmol/L (ref 3.5–5.2)
SODIUM: 139 mmol/L (ref 134–144)
Total Protein: 7.3 g/dL (ref 6.0–8.5)

## 2017-11-24 LAB — HEMOGLOBIN A1C
Est. average glucose Bld gHb Est-mCnc: 114 mg/dL
Hgb A1c MFr Bld: 5.6 % (ref 4.8–5.6)

## 2017-11-25 ENCOUNTER — Other Ambulatory Visit: Payer: Self-pay | Admitting: Internal Medicine

## 2017-11-25 MED ORDER — ATORVASTATIN CALCIUM 10 MG PO TABS: 10.0000 mg | ORAL_TABLET | Freq: Every day | ORAL | 3 refills | Status: DC

## 2017-11-26 ENCOUNTER — Ambulatory Visit: Payer: Self-pay | Attending: Family Medicine

## 2017-11-28 ENCOUNTER — Telehealth: Payer: Self-pay

## 2017-11-28 NOTE — Telephone Encounter (Signed)
Contacted pt to go over lab results pt was not home gave results to pt mother and she will give him the results

## 2017-12-20 ENCOUNTER — Ambulatory Visit: Payer: Self-pay

## 2017-12-26 ENCOUNTER — Ambulatory Visit: Payer: Self-pay

## 2018-01-02 ENCOUNTER — Ambulatory Visit: Payer: Self-pay | Attending: Family Medicine | Admitting: Physician Assistant

## 2018-01-02 VITALS — BP 141/85 | HR 82 | Temp 97.5°F | Resp 18 | Ht 71.0 in | Wt 259.0 lb

## 2018-01-02 DIAGNOSIS — F17200 Nicotine dependence, unspecified, uncomplicated: Secondary | ICD-10-CM

## 2018-01-02 DIAGNOSIS — Z79899 Other long term (current) drug therapy: Secondary | ICD-10-CM | POA: Insufficient documentation

## 2018-01-02 DIAGNOSIS — F17210 Nicotine dependence, cigarettes, uncomplicated: Secondary | ICD-10-CM | POA: Insufficient documentation

## 2018-01-02 DIAGNOSIS — I10 Essential (primary) hypertension: Secondary | ICD-10-CM | POA: Insufficient documentation

## 2018-01-02 MED ORDER — AMLODIPINE BESYLATE 10 MG PO TABS: 10.0000 mg | ORAL_TABLET | Freq: Every day | ORAL | 1 refills | Status: DC

## 2018-01-02 NOTE — Patient Instructions (Signed)

## 2018-01-02 NOTE — Progress Notes (Signed)
Patient ID: [name], male   DOB: 1970-04-02, 47 y.o.   MRN: [id number]   [name], is a 47 y.o. male  [id number]  [id number]  DOB - [birth year]  Subjective:  Chief Complaint and HPI: [name] is a 47 y.o. male here today for BP check for plasma donation.  He didn't take his BP meds today.  No HA/CP/no SOB.  Never came back for BP recheck.  +smoker  ROS:   Constitutional:  No f/c, No night sweats, No unexplained weight loss. EENT:  No vision changes, No blurry vision, No hearing changes. No mouth, throat, or ear problems.  Respiratory: No cough, No SOB Cardiac: No CP, no palpitations GI:  No abd pain, No N/V/D. GU: No Urinary s/sx Musculoskeletal: No joint pain Neuro: No headache, no dizziness, no motor weakness.  Skin: No rash Endocrine:  No polydipsia. No polyuria.  Psych: Denies SI/HI  No problems updated.  ALLERGIES: No Known Allergies  PAST MEDICAL HISTORY: Past Medical History:  Diagnosis Date  . Renal disorder     MEDICATIONS AT HOME: Prior to Admission medications   Medication Sig Start Date End Date Taking? Authorizing Provider  atorvastatin (LIPITOR) 10 MG tablet Take 1 tablet (10 mg total) by mouth daily. 11/25/17  Yes Ladell Pier, MD  traMADol (ULTRAM) 50 MG tablet Take 1 tablet (50 mg total) by mouth every 6 (six) hours as needed. 11/23/17  Yes Ladell Pier, MD     Objective:  EXAM:   Vitals:   01/02/18 0954  BP: (!) 141/85  Pulse: 82  Resp: 18  Temp: (!) 97.5 F (36.4 C)  TempSrc: Oral  SpO2: 97%  Weight: 259 lb (117.5 kg)  Height: 5\' 11"  (1.803 m)   I rechecked his pressure manually in his R arm sitting and it was 144/98 General appearance : A&OX3. NAD. Non-toxic-appearing HEENT: Atraumatic and Normocephalic.  PERRLA. EOM intact.   Neck: supple, no JVD. No cervical lymphadenopathy. No thyromegaly Chest/Lungs:  Breathing-non-labored, Good air entry bilaterally, breath sounds normal without rales, rhonchi, or  wheezing  CVS: S1 S2 regular, no murmurs, gallops, rubs  Extremities: Bilateral Lower Ext shows no edema, both legs are warm to touch with = pulse throughout Neurology:  CN II-XII grossly intact, Non focal.   Psych:  TP linear. J/I WNL. Normal speech. Appropriate eye contact and affect.  Skin:  No Rash  Data Review Lab Results  Component Value Date   HGBA1C 5.6 11/23/2017   HGBA1C 4.6 08/25/2013     Assessment & Plan   1. Essential hypertension Stop amlodipine 7.5.  Start amlodipine 10mg  1 daily.  Check BP OOO and bring in readings.  Keep f/up appt and bring form for PCP to review  2. Smoker Smoking cessation advised.  Info given   Patient have been counseled extensively about nutrition and exercise  Return for for appt with Dr Johnson/recheck BP/bring plasma form.  The patient was given clear instructions to go to ER or return to medical center if symptoms don't improve, worsen or new problems develop. The patient verbalized understanding. The patient was told to call to get lab results if they haven't heard anything in the next week.     Freeman Caldron, PA-C [facility] [place], [place]   01/02/2018, 10:05 AM

## 2018-01-11 ENCOUNTER — Ambulatory Visit: Payer: Self-pay | Attending: Internal Medicine | Admitting: Internal Medicine

## 2018-01-11 ENCOUNTER — Encounter: Payer: Self-pay | Admitting: Internal Medicine

## 2018-01-11 VITALS — BP 135/89 | HR 70 | Temp 97.8°F | Resp 16 | Wt 262.0 lb

## 2018-01-11 DIAGNOSIS — K219 Gastro-esophageal reflux disease without esophagitis: Secondary | ICD-10-CM | POA: Insufficient documentation

## 2018-01-11 DIAGNOSIS — Z79899 Other long term (current) drug therapy: Secondary | ICD-10-CM | POA: Insufficient documentation

## 2018-01-11 DIAGNOSIS — R1030 Lower abdominal pain, unspecified: Secondary | ICD-10-CM | POA: Insufficient documentation

## 2018-01-11 DIAGNOSIS — I10 Essential (primary) hypertension: Secondary | ICD-10-CM | POA: Insufficient documentation

## 2018-01-11 DIAGNOSIS — M62838 Other muscle spasm: Secondary | ICD-10-CM

## 2018-01-11 DIAGNOSIS — K029 Dental caries, unspecified: Secondary | ICD-10-CM | POA: Insufficient documentation

## 2018-01-11 DIAGNOSIS — Z8249 Family history of ischemic heart disease and other diseases of the circulatory system: Secondary | ICD-10-CM | POA: Insufficient documentation

## 2018-01-11 DIAGNOSIS — E785 Hyperlipidemia, unspecified: Secondary | ICD-10-CM | POA: Insufficient documentation

## 2018-01-11 DIAGNOSIS — F17210 Nicotine dependence, cigarettes, uncomplicated: Secondary | ICD-10-CM | POA: Insufficient documentation

## 2018-01-11 LAB — POCT URINALYSIS DIP (CLINITEK)
BILIRUBIN UA: NEGATIVE
BILIRUBIN UA: NEGATIVE mg/dL
Glucose, UA: NEGATIVE mg/dL
Leukocytes, UA: NEGATIVE
Nitrite, UA: NEGATIVE
POC PROTEIN,UA: 30 — AB
SPEC GRAV UA: 1.025 (ref 1.010–1.025)
Urobilinogen, UA: 0.2 E.U./dL
pH, UA: 5.5 (ref 5.0–8.0)

## 2018-01-11 MED ORDER — OMEPRAZOLE 40 MG PO CPDR: 40.0000 mg | DELAYED_RELEASE_CAPSULE | Freq: Every day | ORAL | 5 refills | Status: DC

## 2018-01-11 MED ORDER — TRAMADOL HCL 50 MG PO TABS: 50.0000 mg | ORAL_TABLET | Freq: Four times a day (QID) | ORAL | 0 refills | Status: DC | PRN

## 2018-01-11 MED FILL — OMEPRAZOLE DR 40 MG CAPSULE: 40 | 30 days supply | Qty: 30 | Fill #0

## 2018-01-11 MED FILL — AMLODIPINE BESYLATE 10 MG T: 10 | 30 days supply | Qty: 30 | Fill #0

## 2018-01-11 MED FILL — traMADol HCL 50 MG TABS: 50 | 5 days supply | Qty: 20 | Fill #0

## 2018-01-11 MED FILL — ATORVASTATIN 10 MG TABLET: 10 | 30 days supply | Qty: 30 | Fill #0

## 2018-01-11 NOTE — Progress Notes (Signed)
Patient ID: [name], male    DOB: February 28, 1970  MRN: [id number]  CC: Hypertension and left side pain   Subjective: [name] is a 47 y.o. male who presents for 7 wk f/u.  Wife is with him His concerns today include:  HTN, HL, tob dep  HTN: Started on Norvasc on last visit with me.  Subsequently saw our PA the early part of this month and blood pressure was not at goal.  Amlodipine was increased to 10 mg.  However he is still taking the 7.5 mg because he still had few weeks of those pills left.  He plans to pick up the 10 mg today.  He is trying to limit salt in the foods.  He has a form with him from the plasma center where he donates plasma.  He would like for me to fill out the form if his blood pressure is now acceptable to allow him to continue to donate.  HL: I went over blood test with him from last visit.  His total and LDL cholesterol were not at goal.  I recommend starting Lipitor.  He plans to pick up that prescription today.    He reports that he is dental pain resolved after course of antibiotic and tramadol that was given to him when I saw him in October.  However recently he has had flareup of pain again.  He has completed the forms for the orange card but needs to get notary before he can turn it in.    Patient Active Problem List   Diagnosis Date Noted  . Dental cavities 11/23/2017  . Essential hypertension 11/23/2017  . New daily persistent headache 11/23/2017  . Scalp lump 08/25/2013  . Preventative health care 08/25/2013     Current Outpatient Medications on File Prior to Visit  Medication Sig Dispense Refill  . amLODipine (NORVASC) 10 MG tablet Take 1 tablet (10 mg total) by mouth daily. 90 tablet 1  . atorvastatin (LIPITOR) 10 MG tablet Take 1 tablet (10 mg total) by mouth daily. 90 tablet 3  . traMADol (ULTRAM) 50 MG tablet Take 1 tablet (50 mg total) by mouth every 6 (six) hours as needed. 20 tablet 0   No current facility-administered medications on  file prior to visit.     No Known Allergies  Social History   Socioeconomic History  . Marital status: Married    Spouse name: Not on file  . Number of children: Not on file  . Years of education: Not on file  . Highest education level: Not on file  Occupational History  . Not on file  Social Needs  . Financial resource strain: Not on file  . Food insecurity:    Worry: Not on file    Inability: Not on file  . Transportation needs:    Medical: Not on file    Non-medical: Not on file  Tobacco Use  . Smoking status: Current Every Day Smoker    Packs/day: 0.50    Types: Cigarettes  . Smokeless tobacco: Never Used  Substance and Sexual Activity  . Alcohol use: Yes    Alcohol/week: 12.0 standard drinks    Types: 12 Cans of beer per week  . Drug use: No  . Sexual activity: Not Currently  Lifestyle  . Physical activity:    Days per week: Not on file    Minutes per session: Not on file  . Stress: Not on file  Relationships  . Social connections:  Talks on phone: Not on file    Gets together: Not on file    Attends religious service: Not on file    Active member of club or organization: Not on file    Attends meetings of clubs or organizations: Not on file    Relationship status: Not on file  . Intimate partner violence:    Fear of current or ex partner: Not on file    Emotionally abused: Not on file    Physically abused: Not on file    Forced sexual activity: Not on file  Other Topics Concern  . Not on file  Social History Narrative  . Not on file    Family History  Problem Relation Age of Onset  . Stroke Father   . Hypertension Father     Past Surgical History:  Procedure Laterality Date  . DG 3RD DIGIT RIGHT HAND    . DG 4TH DIGIT RIGHT HAND      ROS: Review of Systems MSK: Complains of intermittent pain in the right side and lower abdomen.  Occurs only when he has coughing spasms for a few days.  After a while the muscles in these areas gets sore.   He denies any dysuria or hematuria.  Wanted to be checked for bladder infection. GI: Complains of acid reflux for the past few years.  Worse with certain foods like spaghetti and lasagna.  Worse at nights where he gets bitter taste refluxing into the throat and sometimes the nose.  He "has been eating tums."  PHYSICAL EXAM: BP 135/89 (BP Location: Left Arm, Cuff Size: Large) Comment: recheck  Pulse 70   Temp 97.8 F (36.6 C) (Oral)   Resp 16   Wt 262 lb (118.8 kg)   SpO2 97%   BMI 36.54 kg/m   Physical Exam  General appearance - alert, well appearing, and in no distress Mental status - normal mood, behavior, speech, dress, motor activity, and thought processes Neck - supple, no significant adenopathy Mouth: Several decayed molars.  No signs of abscess at this time. Chest - clear to auscultation, no wheezes, rales or rhonchi, symmetric air entry Heart - normal rate, regular rhythm, normal S1, S2, no murmurs, rubs, clicks or gallops Abdomen -normal bowel sounds soft.  Mild tenderness on the right lateral oblique.  No rebound tenderness. Extremities - no LE edema  Results for orders placed or performed in visit on 01/11/18  POCT URINALYSIS DIP (CLINITEK)  Result Value Ref Range   Color, UA yellow yellow   Clarity, UA clear clear   Glucose, UA negative negative mg/dL   Bilirubin, UA negative negative   Ketones, POC UA negative negative mg/dL   Spec Grav, UA 1.025 1.010 - 1.025   Blood, UA trace-intact (A) negative   pH, UA 5.5 5.0 - 8.0   POC PROTEIN,UA =30 (A) negative, trace   Urobilinogen, UA 0.2 0.2 or 1.0 E.U./dL   Nitrite, UA Negative Negative   Leukocytes, UA Negative Negative      ASSESSMENT AND PLAN:  1. Essential hypertension Not at goal.  Improved.  Increase amlodipine to 10 mg daily.  Continue to encourage low-salt diet. Form completed to allow him to donate plasma. Follow-up with clinical pharmacist in 6 weeks for repeat blood pressure check. 2.  Hyperlipidemia with target LDL less than 100 Patient to pick up Lipitor today  3. Gastroesophageal reflux disease without esophagitis GERD precautions discussed.  Advised patient to avoid highly seasoned foods, foods that have tomato based  in it, juices etc.  He should eat his last meal at least 2 to 3 hours before laying down at night and try to sleep with his head a little elevated.  We will start him on omeprazole. - omeprazole (PRILOSEC) 40 MG capsule; Take 1 capsule (40 mg total) by mouth daily.  Dispense: 30 capsule; Refill: 5  4. Dental cavities One-time refill given on tramadol.  Encourage patient to complete his paperwork for the orange card and try to get in with a dentist as soon as possible.  I have also given him a handout of dental resources in the area if you wish to pay out-of-pocket. - Ambulatory referral to Dentistry  5. Spasm of abdominal muscles of right side I think this is musculoskeletal pain.  Tylenol as needed - POCT URINALYSIS DIP (CLINITEK)  Patient was given the opportunity to ask questions.  Patient verbalized understanding of the plan and was able to repeat key elements of the plan.   No orders of the defined types were placed in this encounter.    Requested Prescriptions   Pending Prescriptions Disp Refills  . traMADol (ULTRAM) 50 MG tablet 20 tablet 0    Sig: Take 1 tablet (50 mg total) by mouth every 6 (six) hours as needed.    No follow-ups on file.  Karle Plumber, MD, FACP

## 2018-01-11 NOTE — Patient Instructions (Addendum)
Please give appointment with clinical pharmacist for repeat blood pressure check in 6 wks.   Your blood pressure is better but not at goal of 130/80 or lower.  Please increase amlodipine to 10 mg daily.  Try to limit salt in the foods as much as possible.  We have started you on the medication called Lipitor to help lower your cholesterol.  Healthy eating habits and regular exercise will also help in lowering cholesterol.  Start omeprazole daily as discussed to help decrease heartburn symptoms.  If no improvement please follow-up.  Please schedule with a dentist as soon as possible.   Gastroesophageal Reflux Disease, Adult Normally, food travels down the esophagus and stays in the stomach to be digested. If a person has gastroesophageal reflux disease (GERD), food and stomach acid move back up into the esophagus. When this happens, the esophagus becomes sore and swollen (inflamed). Over time, GERD can make small holes (ulcers) in the lining of the esophagus. Follow these instructions at home: Diet  Follow a diet as told by your doctor. You may need to avoid foods and drinks such as: ? Coffee and tea (with or without caffeine). ? Drinks that contain alcohol. ? Energy drinks and sports drinks. ? Carbonated drinks or sodas. ? Chocolate and cocoa. ? Peppermint and mint flavorings. ? Garlic and onions. ? Horseradish. ? Spicy and acidic foods, such as peppers, chili powder, curry powder, vinegar, hot sauces, and BBQ sauce. ? Citrus fruit juices and citrus fruits, such as oranges, lemons, and limes. ? Tomato-based foods, such as red sauce, chili, salsa, and pizza with red sauce. ? Fried and fatty foods, such as donuts, french fries, potato chips, and high-fat dressings. ? High-fat meats, such as hot dogs, rib eye steak, sausage, ham, and bacon. ? High-fat dairy items, such as whole milk, butter, and cream cheese.  Eat small meals often. Avoid eating large meals.  Avoid drinking large  amounts of liquid with your meals.  Avoid eating meals during the 2-3 hours before bedtime.  Avoid lying down right after you eat.  Do not exercise right after you eat. General instructions  Pay attention to any changes in your symptoms.  Take over-the-counter and prescription medicines only as told by your doctor. Do not take aspirin, ibuprofen, or other NSAIDs unless your doctor says it is okay.  Do not use any tobacco products, including cigarettes, chewing tobacco, and e-cigarettes. If you need help quitting, ask your doctor.  Wear loose clothes. Do not wear anything tight around your waist.  Raise (elevate) the head of your bed about 6 inches (15 cm).  Try to lower your stress. If you need help doing this, ask your doctor.  If you are overweight, lose an amount of weight that is healthy for you. Ask your doctor about a safe weight loss goal.  Keep all follow-up visits as told by your doctor. This is important. Contact a doctor if:  You have new symptoms.  You lose weight and you do not know why it is happening.  You have trouble swallowing, or it hurts to swallow.  You have wheezing or a cough that keeps happening.  Your symptoms do not get better with treatment.  You have a hoarse voice. Get help right away if:  You have pain in your arms, neck, jaw, teeth, or back.  You feel sweaty, dizzy, or light-headed.  You have chest pain or shortness of breath.  You throw up (vomit) and your throw up looks like blood  or coffee grounds.  You pass out (faint).  Your poop (stool) is bloody or black.  You cannot swallow, drink, or eat. This information is not intended to replace advice given to you by your health care provider. Make sure you discuss any questions you have with your health care provider. Document Released: 07/05/2007 Document Revised: 06/24/2015 Document Reviewed: 05/13/2014 Elsevier Interactive Patient Education  [employer].

## 2018-03-28 ENCOUNTER — Encounter: Payer: Self-pay | Admitting: Internal Medicine

## 2018-03-28 ENCOUNTER — Ambulatory Visit: Payer: Self-pay | Attending: Internal Medicine | Admitting: Internal Medicine

## 2018-03-28 VITALS — BP 158/105 | HR 94 | Temp 98.0°F | Resp 16 | Wt 263.6 lb

## 2018-03-28 DIAGNOSIS — K219 Gastro-esophageal reflux disease without esophagitis: Secondary | ICD-10-CM | POA: Insufficient documentation

## 2018-03-28 DIAGNOSIS — Z91199 Patient's noncompliance with other medical treatment and regimen due to unspecified reason: Secondary | ICD-10-CM | POA: Insufficient documentation

## 2018-03-28 DIAGNOSIS — F17200 Nicotine dependence, unspecified, uncomplicated: Secondary | ICD-10-CM | POA: Insufficient documentation

## 2018-03-28 DIAGNOSIS — E785 Hyperlipidemia, unspecified: Secondary | ICD-10-CM | POA: Insufficient documentation

## 2018-03-28 DIAGNOSIS — Z9119 Patient's noncompliance with other medical treatment and regimen: Secondary | ICD-10-CM

## 2018-03-28 DIAGNOSIS — I10 Essential (primary) hypertension: Secondary | ICD-10-CM

## 2018-03-28 MED ORDER — ATORVASTATIN CALCIUM 10 MG PO TABS: 10.0000 mg | ORAL_TABLET | Freq: Every day | ORAL | 3 refills | Status: DC

## 2018-03-28 MED ORDER — AMLODIPINE BESYLATE 5 MG PO TABS: 7.5000 mg | ORAL_TABLET | Freq: Every day | ORAL | 3 refills | Status: DC

## 2018-03-28 MED ORDER — OMEPRAZOLE 40 MG PO CPDR: 40.0000 mg | DELAYED_RELEASE_CAPSULE | Freq: Every day | ORAL | 5 refills | Status: DC

## 2018-03-28 MED FILL — OMEPRAZOLE DR 40 MG CAPSULE: 40 | 30 days supply | Qty: 30 | Fill #1

## 2018-03-28 MED FILL — ATORVASTATIN 10 MG TABLET: 10 | 30 days supply | Qty: 30 | Fill #1

## 2018-03-28 MED FILL — AMLODIPINE BESYLATE 5 MG TA: 5 | 30 days supply | Qty: 45 | Fill #0

## 2018-03-28 NOTE — Progress Notes (Signed)
Patient ID: [name], male    DOB: 1970-03-16  MRN: [id number]  CC: Hypertension   Subjective: [name] is a 48 y.o. male who presents for chronic ds management.  Wife is with him His concerns today include:  HTN, HL, tob dep, GERD  HTN: Patient's blood pressure today is elevated.  He has been out of amlodipine for 1-1/2 weeks.  On last visit with me I had increased amlodipine to 10 mg daily.  However patient states that when he took the increased dose it always made him feel as though he had a muscle strain in his chest when he is reaching for objects so he went back to taking 7.5 mg daily.  He ran out of that a week and a half ago.  Endorses mild headaches since being out of medications.  No lower extremity edema.  He tries to limit salt in the foods.  HL: He is supposed to be on atorvastatin.  Patient states that he took the medication until he ran out and did not know that he was supposed to continue taking it.  GERD: Requests refill on omeprazole.  Tobacco dependence: Still smoking daily.  He is not ready to quit as yet. Patient Active Problem List   Diagnosis Date Noted  . Dental cavities 11/23/2017  . Essential hypertension 11/23/2017  . New daily persistent headache 11/23/2017  . Scalp lump 08/25/2013  . Preventative health care 08/25/2013     No current outpatient medications on file prior to visit.   No current facility-administered medications on file prior to visit.     No Known Allergies  Social History   Socioeconomic History  . Marital status: Married    Spouse name: Not on file  . Number of children: Not on file  . Years of education: Not on file  . Highest education level: Not on file  Occupational History  . Not on file  Social Needs  . Financial resource strain: Not on file  . Food insecurity:    Worry: Not on file    Inability: Not on file  . Transportation needs:    Medical: Not on file    Non-medical: Not on file  Tobacco Use  .  Smoking status: Current Every Day Smoker    Packs/day: 0.50    Types: Cigarettes  . Smokeless tobacco: Never Used  Substance and Sexual Activity  . Alcohol use: Yes    Alcohol/week: 12.0 standard drinks    Types: 12 Cans of beer per week  . Drug use: No  . Sexual activity: Not Currently  Lifestyle  . Physical activity:    Days per week: Not on file    Minutes per session: Not on file  . Stress: Not on file  Relationships  . Social connections:    Talks on phone: Not on file    Gets together: Not on file    Attends religious service: Not on file    Active member of club or organization: Not on file    Attends meetings of clubs or organizations: Not on file    Relationship status: Not on file  . Intimate partner violence:    Fear of current or ex partner: Not on file    Emotionally abused: Not on file    Physically abused: Not on file    Forced sexual activity: Not on file  Other Topics Concern  . Not on file  Social History Narrative  . Not on file  Family History  Problem Relation Age of Onset  . Stroke Father   . Hypertension Father     Past Surgical History:  Procedure Laterality Date  . DG 3RD DIGIT RIGHT HAND    . DG 4TH DIGIT RIGHT HAND      ROS: Review of Systems Negative except as stated above  PHYSICAL EXAM: BP (!) 158/105   Pulse 94   Temp 98 F (36.7 C) (Oral)   Resp 16   Wt 263 lb 9.6 oz (119.6 kg)   SpO2 99%   BMI 36.76 kg/m   Physical Exam  General appearance - alert, well appearing, and in no distress Mental status - normal mood, behavior, speech, dress, motor activity, and thought processes Neck - supple, no significant adenopathy Chest - clear to auscultation, no wheezes, rales or rhonchi, symmetric air entry Heart - normal rate, regular rhythm, normal S1, S2, no murmurs, rubs, clicks or gallops Extremities - peripheral pulses normal, no pedal edema, no clubbing or cyanosis  CMP Latest Ref Rng & Units 11/23/2017 10/04/2017 10/19/2014   Glucose 65 - 99 mg/dL 91 138(H) 103(H)  BUN 6 - 24 mg/dL 11 6 7   Creatinine 0.76 - 1.27 mg/dL 1.03 1.05 0.95  Sodium 134 - 144 mmol/L 139 140 135  Potassium 3.5 - 5.2 mmol/L 4.9 3.9 4.1  Chloride 96 - 106 mmol/L 101 104 105  CO2 20 - 29 mmol/L 22 18(L) 23  Calcium 8.7 - 10.2 mg/dL 10.2 9.2 9.0  Total Protein 6.0 - 8.5 g/dL 7.3 - 7.0  Total Bilirubin 0.0 - 1.2 mg/dL 0.3 - 0.7  Alkaline Phos 39 - 117 IU/L 69 - 57  AST 0 - 40 IU/L 18 - 18  ALT 0 - 44 IU/L 26 - 13(L)   Lipid Panel     Component Value Date/Time   CHOL 279 (H) 11/23/2017 1636   TRIG 359 (H) 11/23/2017 1636   HDL 41 11/23/2017 1636   CHOLHDL 6.8 (H) 11/23/2017 1636   CHOLHDL 5.2 08/25/2013 1746   VLDL 52 (H) 08/25/2013 1746   LDLCALC 166 (H) 11/23/2017 1636    CBC    Component Value Date/Time   WBC 11.7 (H) 10/04/2017 1124   RBC 4.80 10/04/2017 1124   HGB 15.3 10/04/2017 1124   HCT 45.4 10/04/2017 1124   PLT 380 10/04/2017 1124   MCV 94.6 10/04/2017 1124   MCH 31.9 10/04/2017 1124   MCHC 33.7 10/04/2017 1124   RDW 12.5 10/04/2017 1124   LYMPHSABS 2.8 10/19/2014 1126   MONOABS 0.7 10/19/2014 1126   EOSABS 0.1 10/19/2014 1126   BASOSABS 0.0 10/19/2014 1126    ASSESSMENT AND PLAN: 1. Essential hypertension Not at goal due to noncompliance.  I have refilled amlodipine.  I will put him back on the 7.5 mg and he agrees to come back in 2 weeks for blood pressure recheck with our clinical pharmacist.  If he is not at goal of 130/80 or lower, I would recommend adding HCTZ DASH diet encouraged. - amLODipine (NORVASC) 5 MG tablet; Take 1.5 tablets (7.5 mg total) by mouth daily.  Dispense: 90 tablet; Refill: 3  2. Hyperlipidemia with target LDL less than 100 Advised patient that he needs to take the atorvastatin every day and to return to the pharmacy for refills when he runs out - atorvastatin (LIPITOR) 10 MG tablet; Take 1 tablet (10 mg total) by mouth daily.  Dispense: 90 tablet; Refill: 3  3. Gastroesophageal  reflux disease without esophagitis - omeprazole (  PRILOSEC) 40 MG capsule; Take 1 capsule (40 mg total) by mouth daily.  Dispense: 30 capsule; Refill: 5  4. Medically noncompliant   5. Tobacco dependence Advised to quit.  Patient not ready to give a trial of quitting   Patient was given the opportunity to ask questions.  Patient verbalized understanding of the plan and was able to repeat key elements of the plan.   No orders of the defined types were placed in this encounter.    Requested Prescriptions   Signed Prescriptions Disp Refills  . atorvastatin (LIPITOR) 10 MG tablet 90 tablet 3    Sig: Take 1 tablet (10 mg total) by mouth daily.  Marland Kitchen amLODipine (NORVASC) 5 MG tablet 90 tablet 3    Sig: Take 1.5 tablets (7.5 mg total) by mouth daily.  Marland Kitchen omeprazole (PRILOSEC) 40 MG capsule 30 capsule 5    Sig: Take 1 capsule (40 mg total) by mouth daily.    Return in about 2 months (around 05/27/2018).  Karle Plumber, MD, FACP

## 2018-03-28 NOTE — Patient Instructions (Addendum)
Please give an appointment with our clinical pharmacist in 2 weeks for blood pressure recheck  Take the amlodipine every day for your blood pressure.  When you run out please return to the pharmacy for refills. Take the atorvastatin every day as prescribed for cholesterol.

## 2018-04-09 ENCOUNTER — Encounter: Payer: Self-pay | Admitting: Pharmacist

## 2018-04-09 NOTE — Progress Notes (Deleted)
   S:    PCP: Dr. Wynetta Emery  Patient arrives ***.    Presents to the clinic for hypertension evaluation, counseling, and management. Patient was referred by Dr. Wynetta Emery on 03/28/18. BP at that visit 158/105 - amlodipine was refilled as pt reported being without ~1.5 weeks before the appointment.  Patient {Actions; denies-reports:120008} adherence with medications.  Current BP Medications include:   - amlodipine 7.5 mg daily  Antihypertensives tried in the past include:  - amlodipine 10mg  (pt intolerance)   Dietary habits include: *** Exercise habits include:*** Family / Social history:  - FHx: stroke and HTN (father) - Tobacco: current 0.5 PPD smoker - Alcohol: 12 cans of beer/week  Home BP readings: ***  O:  L arm after 5 minutes rest: ***, HR ***  Last 3 Office BP readings: BP Readings from Last 3 Encounters:  03/28/18 (!) 158/105  01/11/18 135/89  01/02/18 (!) 141/85   BMET    Component Value Date/Time   NA 139 11/23/2017 1636   K 4.9 11/23/2017 1636   CL 101 11/23/2017 1636   CO2 22 11/23/2017 1636   GLUCOSE 91 11/23/2017 1636   GLUCOSE 138 (H) 10/04/2017 1124   BUN 11 11/23/2017 1636   CREATININE 1.03 11/23/2017 1636   CREATININE 1.25 08/25/2013 1746   CALCIUM 10.2 11/23/2017 1636   GFRNONAA 87 11/23/2017 1636   GFRNONAA 71 08/25/2013 1746   GFRAA 101 11/23/2017 1636   GFRAA 82 08/25/2013 1746   Renal function: CrCl cannot be calculated (Patient's most recent lab result is older than the maximum 21 days allowed.).  Clinical ASCVD: No  The 10-year ASCVD risk score Mikey Bussing DC Jr., et al., 2013) is: 20.4%   Values used to calculate the score:     Age: 48 years     Sex: Male     Is Non-Hispanic African American: Yes     Diabetic: No     Tobacco smoker: Yes     Systolic Blood Pressure: 194 mmHg     Is BP treated: Yes     HDL Cholesterol: 41 mg/dL     Total Cholesterol: 279 mg/dL   A/P: Hypertension longstanding currently *** on current medications. BP  Goal = <130/80 mmHg. Patient {Is/is not:9024} adherent with current medications.  -{Meds adjust:18428} ***.  -F/u labs ordered - *** -Counseled on lifestyle modifications for blood pressure control including reduced dietary sodium, increased exercise, adequate sleep -ASCVD risk: Primary prevention in patient with ASCVD risk >20%. Pt on moderate intensity statin.   Results reviewed and written information provided.   Total time in face-to-face counseling *** minutes.   F/U Clinic Visit in ***.  Patient seen with ***

## 2018-04-30 ENCOUNTER — Telehealth: Payer: Self-pay | Admitting: Internal Medicine

## 2018-05-01 MED FILL — OMEPRAZOLE DR 40 MG CAPSULE: 40 | 30 days supply | Qty: 30 | Fill #2

## 2018-05-01 MED FILL — AMLODIPINE BESYLATE 5 MG TA: 5 | 30 days supply | Qty: 45 | Fill #1

## 2018-05-01 MED FILL — ATORVASTATIN 10 MG TABLET: 10 | 30 days supply | Qty: 30 | Fill #2

## 2018-06-03 ENCOUNTER — Ambulatory Visit: Payer: Self-pay | Admitting: Internal Medicine

## 2018-06-21 MED FILL — ?ATORVASTATIN 10 MG TABLET: 10 | 30 days supply | Qty: 30 | Fill #3

## 2018-06-21 MED FILL — ?AMLODIPINE BESYLATE 5MG TA: 5 | 30 days supply | Qty: 45 | Fill #2

## 2018-06-25 MED FILL — ?OMEPRAZOLE 20 MG CAPSULE D: 20 | 30 days supply | Qty: 60 | Fill #0

## 2018-07-01 ENCOUNTER — Ambulatory Visit: Payer: Self-pay | Attending: Internal Medicine | Admitting: Internal Medicine

## 2018-07-01 ENCOUNTER — Other Ambulatory Visit: Payer: Self-pay

## 2018-07-31 ENCOUNTER — Telehealth: Payer: Self-pay | Admitting: *Deleted

## 2018-07-31 ENCOUNTER — Telehealth: Payer: Self-pay | Admitting: Internal Medicine

## 2018-07-31 DIAGNOSIS — Z20822 Contact with and (suspected) exposure to covid-19: Secondary | ICD-10-CM

## 2018-07-31 NOTE — Telephone Encounter (Signed)
Patient wife called stating that the wife was exposed to covid and believes the patient needs to be tested please follow up.

## 2018-07-31 NOTE — Telephone Encounter (Signed)
-----   Message from Rosalio Macadamia, RN sent at 07/31/2018  2:22 PM EDT ----- Regarding: COVID-19 Test Exposure to CoVID-19.  Needs testing,  Primary Coverage  Payer Plan Sponsor Code Group Number Group Name No coverage found      Ellender Hose RN

## 2018-07-31 NOTE — Telephone Encounter (Signed)
LM for pt to return call @ [phone] M-F 7a-7p to schedule covid testing. Order placed  

## 2018-07-31 NOTE — Telephone Encounter (Signed)
Patients call returned.  Patient identified by name and date of birth.  Patient written a letter to [facility] so he could be COVID_19 tested,  Patient explained procedure.  Patient acknowledged understanding of advice.

## 2018-07-31 NOTE — Telephone Encounter (Signed)
Pt was exposed to covid-19 and would like to get tested please follow up

## 2018-08-01 NOTE — Telephone Encounter (Signed)
Pt appt is 7.6.20  At 2pm [facility] site

## 2018-08-03 ENCOUNTER — Emergency Department (HOSPITAL_COMMUNITY): Payer: Self-pay

## 2018-08-03 ENCOUNTER — Other Ambulatory Visit: Payer: Self-pay

## 2018-08-03 ENCOUNTER — Emergency Department (HOSPITAL_COMMUNITY)
Admission: EM | Admit: 2018-08-03 | Discharge: 2018-08-03 | Disposition: A | Discharge status: Home / Self Care | Payer: Self-pay | Attending: Emergency Medicine | Admitting: Emergency Medicine

## 2018-08-03 ENCOUNTER — Emergency Department (HOSPITAL_COMMUNITY)
Admission: EM | Admit: 2018-08-03 | Discharge: 2018-08-03 | Disposition: A | Discharge status: Home / Self Care | Attending: Emergency Medicine | Admitting: Emergency Medicine

## 2018-08-03 ENCOUNTER — Encounter (HOSPITAL_COMMUNITY): Payer: Self-pay | Admitting: Emergency Medicine

## 2018-08-03 ENCOUNTER — Encounter (HOSPITAL_COMMUNITY): Payer: Self-pay

## 2018-08-03 DIAGNOSIS — I10 Essential (primary) hypertension: Secondary | ICD-10-CM | POA: Insufficient documentation

## 2018-08-03 DIAGNOSIS — S060X0D Concussion without loss of consciousness, subsequent encounter: Secondary | ICD-10-CM | POA: Diagnosis not present

## 2018-08-03 DIAGNOSIS — N179 Acute kidney failure, unspecified: Secondary | ICD-10-CM | POA: Diagnosis not present

## 2018-08-03 DIAGNOSIS — M542 Cervicalgia: Secondary | ICD-10-CM | POA: Insufficient documentation

## 2018-08-03 DIAGNOSIS — Z79899 Other long term (current) drug therapy: Secondary | ICD-10-CM | POA: Insufficient documentation

## 2018-08-03 DIAGNOSIS — Y999 Unspecified external cause status: Secondary | ICD-10-CM | POA: Insufficient documentation

## 2018-08-03 DIAGNOSIS — F17210 Nicotine dependence, cigarettes, uncomplicated: Secondary | ICD-10-CM | POA: Diagnosis not present

## 2018-08-03 DIAGNOSIS — Y939 Activity, unspecified: Secondary | ICD-10-CM | POA: Insufficient documentation

## 2018-08-03 DIAGNOSIS — Y929 Unspecified place or not applicable: Secondary | ICD-10-CM | POA: Insufficient documentation

## 2018-08-03 DIAGNOSIS — S0083XA Contusion of other part of head, initial encounter: Secondary | ICD-10-CM

## 2018-08-03 DIAGNOSIS — R109 Unspecified abdominal pain: Secondary | ICD-10-CM | POA: Insufficient documentation

## 2018-08-03 DIAGNOSIS — S0012XA Contusion of left eyelid and periocular area, initial encounter: Secondary | ICD-10-CM | POA: Insufficient documentation

## 2018-08-03 DIAGNOSIS — R040 Epistaxis: Secondary | ICD-10-CM | POA: Insufficient documentation

## 2018-08-03 LAB — CBC WITH DIFFERENTIAL/PLATELET
Abs Immature Granulocytes: 0.1 10*3/uL — ABNORMAL HIGH (ref 0.00–0.07)
Basophils Absolute: 0.1 10*3/uL (ref 0.0–0.1)
Basophils Relative: 0 %
Eosinophils Absolute: 0 10*3/uL (ref 0.0–0.5)
Eosinophils Relative: 0 %
HCT: 44.1 % (ref 39.0–52.0)
Hemoglobin: 15 g/dL (ref 13.0–17.0)
Immature Granulocytes: 1 %
Lymphocytes Relative: 12 %
Lymphs Abs: 2.3 10*3/uL (ref 0.7–4.0)
MCH: 31.4 pg (ref 26.0–34.0)
MCHC: 34 g/dL (ref 30.0–36.0)
MCV: 92.5 fL (ref 80.0–100.0)
Monocytes Absolute: 1.2 10*3/uL — ABNORMAL HIGH (ref 0.1–1.0)
Monocytes Relative: 7 %
Neutro Abs: 14.8 10*3/uL — ABNORMAL HIGH (ref 1.7–7.7)
Neutrophils Relative %: 80 %
Platelets: 384 10*3/uL (ref 150–400)
RBC: 4.77 MIL/uL (ref 4.22–5.81)
RDW: 12.8 % (ref 11.5–15.5)
WBC: 18.5 10*3/uL — ABNORMAL HIGH (ref 4.0–10.5)
nRBC: 0 % (ref 0.0–0.2)

## 2018-08-03 LAB — COMPREHENSIVE METABOLIC PANEL
ALT: 31 U/L (ref 0–44)
AST: 45 U/L — ABNORMAL HIGH (ref 15–41)
Albumin: 4.1 g/dL (ref 3.5–5.0)
Alkaline Phosphatase: 66 U/L (ref 38–126)
Anion gap: 14 (ref 5–15)
BUN: 11 mg/dL (ref 6–20)
CO2: 21 mmol/L — ABNORMAL LOW (ref 22–32)
Calcium: 9.2 mg/dL (ref 8.9–10.3)
Chloride: 105 mmol/L (ref 98–111)
Creatinine, Ser: 1.46 mg/dL — ABNORMAL HIGH (ref 0.61–1.24)
GFR calc Af Amer: >60 mL/min (ref >60)
GFR calc non Af Amer: 57 mL/min — ABNORMAL LOW (ref >60)
Glucose, Bld: 132 mg/dL — ABNORMAL HIGH (ref 70–99)
Potassium: 3.7 mmol/L (ref 3.5–5.1)
Sodium: 140 mmol/L (ref 135–145)
Total Bilirubin: 0.7 mg/dL (ref 0.3–1.2)
Total Protein: 6.7 g/dL (ref 6.5–8.1)

## 2018-08-03 LAB — ETHANOL: Alcohol, Ethyl (B): 102 mg/dL — ABNORMAL HIGH (ref <10)

## 2018-08-03 MED ORDER — MECLIZINE HCL 25 MG PO TABS: 25.0000 mg | ORAL_TABLET | Freq: Three times a day (TID) | ORAL | 0 refills | Status: DC | PRN

## 2018-08-03 MED ORDER — SODIUM CHLORIDE 0.9 % IV BOLUS: 500.0000 mL | Freq: Once | INTRAVENOUS | Status: DC

## 2018-08-03 MED ORDER — IOHEXOL 300 MG/ML  SOLN
100.0000 mL | Freq: Once | INTRAMUSCULAR | Status: AC | PRN
  Administered 2018-08-03: 100 mL via INTRAVENOUS

## 2018-08-03 MED ORDER — AFRIN NASAL SPRAY 0.05 % NA SOLN: 1.0000 | Freq: Two times a day (BID) | NASAL | 0 refills | Status: DC | PRN

## 2018-08-03 NOTE — ED Notes (Signed)
Patient verbalized understanding of discharge instructions. Opportunities for questioning and answers were provided. Armband removed by staff. Patient removed from ED.   
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
## 2018-08-03 NOTE — Discharge Instructions (Signed)
You had a CT scan performed today in the emergency department. The CT scan showed that you have enlargement of your pulmonary artery as well as a kidney stone in your kidney. Please follow-up with your family doctor for recheck.
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
## 2018-08-03 NOTE — ED Notes (Signed)
Patient transported to CT 
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
## 2018-08-03 NOTE — ED Notes (Signed)
Patient verbalizes understanding of discharge instructions. Opportunity for questioning and answers were provided. Armband removed by staff, pt discharged from ED.  

## 2018-08-05 ENCOUNTER — Other Ambulatory Visit: Payer: Self-pay

## 2018-08-05 MED FILL — MECLIZINE 25 MG TABLET: 25 | 10 days supply | Qty: 30 | Fill #0

## 2018-08-06 ENCOUNTER — Other Ambulatory Visit: Payer: Self-pay

## 2018-08-08 MED FILL — ?AMLODIPINE BESYLATE 5MG TA: 5 | 30 days supply | Qty: 45 | Fill #3

## 2018-08-08 MED FILL — ?ATORVASTATIN 10 MG TABLET: 10 | 30 days supply | Qty: 30 | Fill #4

## 2018-08-08 MED FILL — ?OMEPRAZOLE 20 MG CAPSULE D: 20 | 30 days supply | Qty: 60 | Fill #1

## 2018-08-11 LAB — NOVEL CORONAVIRUS, NAA: SARS-CoV-2, NAA: NOT DETECTED
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
## 2018-08-15 ENCOUNTER — Other Ambulatory Visit: Payer: Self-pay

## 2018-08-15 ENCOUNTER — Ambulatory Visit: Payer: Self-pay | Attending: Internal Medicine

## 2018-09-05 ENCOUNTER — Ambulatory Visit: Admitting: Internal Medicine

## 2018-09-17 ENCOUNTER — Other Ambulatory Visit: Payer: Self-pay | Admitting: Internal Medicine

## 2018-09-18 MED FILL — ?ATORVASTATIN 10 MG TABLET: 10 | 30 days supply | Qty: 30 | Fill #5

## 2018-09-18 MED FILL — ?AMLODIPINE BESYLATE 5MG TA: 5 | 30 days supply | Qty: 45 | Fill #4

## 2018-09-18 MED FILL — OMEPRAZOLE DR 40 MG CAPSULE: 40 | 30 days supply | Qty: 30 | Fill #0

## 2018-09-24 ENCOUNTER — Telehealth: Payer: Self-pay | Admitting: Internal Medicine

## 2018-09-24 NOTE — Telephone Encounter (Signed)
Returned pt call to go over COVID results pt didn't answer and was unable to lvm due to vm not being set up  If pt calls back please make pt aware that COVD test is negative

## 2018-09-24 NOTE — Telephone Encounter (Signed)
Patient called to get their COVID-19 results.  Please follow up

## 2018-11-01 MED FILL — ?ATORVASTATIN 10 MG TABLET: 10 | 30 days supply | Qty: 30 | Fill #6

## 2018-11-01 MED FILL — ?AMLODIPINE BESYLATE 5MG TA: 5 | 30 days supply | Qty: 45 | Fill #5

## 2018-11-01 MED FILL — OMEPRAZOLE DR 40 MG CAPSULE: 40 | 30 days supply | Qty: 30 | Fill #1

## 2018-12-05 MED FILL — ?ATORVASTATIN 10 MG TABLET: 10 | 30 days supply | Qty: 30 | Fill #0

## 2018-12-05 MED FILL — ?AMLODIPINE BESYLATE 5 MG T: 5 MG | 30 days supply | Qty: 45 | Fill #6

## 2018-12-05 MED FILL — OMEPRAZOLE DR 40 MG CAPSULE: 40 | 30 days supply | Qty: 30 | Fill #2

## 2019-01-08 ENCOUNTER — Other Ambulatory Visit: Payer: Self-pay

## 2019-01-08 ENCOUNTER — Ambulatory Visit: Payer: Self-pay | Attending: Internal Medicine | Admitting: Physician Assistant

## 2019-01-08 DIAGNOSIS — R110 Nausea: Secondary | ICD-10-CM

## 2019-01-08 DIAGNOSIS — R6881 Early satiety: Secondary | ICD-10-CM

## 2019-01-08 DIAGNOSIS — R350 Frequency of micturition: Secondary | ICD-10-CM

## 2019-01-08 DIAGNOSIS — K5909 Other constipation: Secondary | ICD-10-CM

## 2019-01-08 MED ORDER — ONDANSETRON HCL 8 MG PO TABS: 8.0000 mg | ORAL_TABLET | Freq: Three times a day (TID) | ORAL | 0 refills | Status: DC | PRN

## 2019-01-08 MED FILL — ONDANSETRON HCL 8 MG TABLET: 8 | 10 days supply | Qty: 30 | Fill #0

## 2019-01-08 NOTE — Progress Notes (Signed)
Pt. Stated he has been experience constipation.  Pt. Stated he has been urinating a lot lately and have discharges on his under wear. No pain when urinates.  Pt. Have an extreme thirst, he has been drinking water more often.

## 2019-01-08 NOTE — Progress Notes (Signed)
Virtual Visit via Telephone Note  I connected with [name] on 01/08/19 at  1:30 PM EST by telephone and verified that I am speaking with the correct person using two identifiers.   I discussed the limitations, risks, security and privacy concerns of performing an evaluation and management service by telephone and the availability of in person appointments. I also discussed with the patient that there may be a patient responsible charge related to this service. The patient expressed understanding and agreed to proceed.  Patient location:  home My Location:  [place] office Persons on the call:  Me and the patient   History of Present Illness: 3 week h/o constipation.  Some nausea. Poor appetite.  Some vomiting when he eats.  He has lost ~ 5 pounds in the past 3 weeks.  No FH colon/pancreatic CA.  No melena/hematochezia.  No fever.  Also having some crusting in underwear with increased thirst and increased urination. No f/c.  No flank pain.      Observations/Objective:  NAD.  A&Ox3   Assessment and Plan: 1. Early satiety with subjective weight loss - Ambulatory referral to Gastroenterology - Comprehensive metabolic panel; Future - Lipase; Future  2. Nausea To ED if develops abdominal pain or worsening symptoms - ondansetron (ZOFRAN) 8 MG tablet; Take 1 tablet (8 mg total) by mouth every 8 (eight) hours as needed for nausea or vomiting.  Dispense: 30 tablet; Refill: 0 - Ambulatory referral to Gastroenterology - Comprehensive metabolic panel; Future - Lipase; Future  3. Other constipation miralx and increase water intake - CBC with Differential; Future - Ambulatory referral to Gastroenterology - Comprehensive metabolic panel; Future  4. Urinary frequency - Urine culture; Future - Urine cytology ancillary only; Future - Hemoglobin A1c; Future    Follow Up Instructions: See PCP in 6 weeks   I discussed the assessment and treatment plan with the patient. The patient was  provided an opportunity to ask questions and all were answered. The patient agreed with the plan and demonstrated an understanding of the instructions.   The patient was advised to call back or seek an in-person evaluation if the symptoms worsen or if the condition fails to improve as anticipated.  I provided 13 minutes of non-face-to-face time during this encounter.   Freeman Caldron, PA-C  Patient ID: [name], male   DOB: 1970-08-26, 48 y.o.   MRN: [id number]

## 2019-01-09 ENCOUNTER — Ambulatory Visit: Payer: Self-pay | Attending: Internal Medicine

## 2019-01-09 ENCOUNTER — Other Ambulatory Visit: Payer: Self-pay

## 2019-01-09 DIAGNOSIS — R6881 Early satiety: Secondary | ICD-10-CM

## 2019-01-09 DIAGNOSIS — K5909 Other constipation: Secondary | ICD-10-CM

## 2019-01-09 DIAGNOSIS — R350 Frequency of micturition: Secondary | ICD-10-CM

## 2019-01-09 DIAGNOSIS — R110 Nausea: Secondary | ICD-10-CM

## 2019-01-10 LAB — CBC WITH DIFFERENTIAL/PLATELET
Basophils Absolute: 0.1 10*3/uL (ref 0.0–0.2)
Basos: 1 %
EOS (ABSOLUTE): 0.2 10*3/uL (ref 0.0–0.4)
Eos: 2 %
Hematocrit: 40.3 % (ref 37.5–51.0)
Hemoglobin: 13.7 g/dL (ref 13.0–17.7)
Immature Grans (Abs): 0.1 10*3/uL (ref 0.0–0.1)
Immature Granulocytes: 1 %
Lymphocytes Absolute: 3.6 10*3/uL — ABNORMAL HIGH (ref 0.7–3.1)
Lymphs: 38 %
MCH: 30.9 pg (ref 26.6–33.0)
MCHC: 34 g/dL (ref 31.5–35.7)
MCV: 91 fL (ref 79–97)
Monocytes Absolute: 0.9 10*3/uL (ref 0.1–0.9)
Monocytes: 10 %
Neutrophils Absolute: 4.6 10*3/uL (ref 1.4–7.0)
Neutrophils: 48 %
Platelets: 394 10*3/uL (ref 150–450)
RBC: 4.44 x10E6/uL (ref 4.14–5.80)
RDW: 11.9 % (ref 11.6–15.4)
WBC: 9.5 10*3/uL (ref 3.4–10.8)

## 2019-01-10 LAB — COMPREHENSIVE METABOLIC PANEL
ALT: 12 IU/L (ref 0–44)
AST: 13 IU/L (ref 0–40)
Albumin/Globulin Ratio: 1.3 (ref 1.2–2.2)
Albumin: 3.8 g/dL — ABNORMAL LOW (ref 4.0–5.0)
Alkaline Phosphatase: 165 IU/L — ABNORMAL HIGH (ref 39–117)
BUN/Creatinine Ratio: 10 (ref 9–20)
BUN: 11 mg/dL (ref 6–24)
Bilirubin Total: 0.2 mg/dL (ref 0.0–1.2)
CO2: 20 mmol/L (ref 20–29)
Calcium: 9.4 mg/dL (ref 8.7–10.2)
Chloride: 89 mmol/L — ABNORMAL LOW (ref 96–106)
Creatinine, Ser: 1.1 mg/dL (ref 0.76–1.27)
GFR calc Af Amer: 92 mL/min/{1.73_m2} (ref >59)
GFR calc non Af Amer: 80 mL/min/{1.73_m2} (ref >59)
Globulin, Total: 2.9 g/dL (ref 1.5–4.5)
Glucose: 577 mg/dL (ref 65–99)
Potassium: 4.3 mmol/L (ref 3.5–5.2)
Sodium: 127 mmol/L — ABNORMAL LOW (ref 134–144)
Total Protein: 6.7 g/dL (ref 6.0–8.5)

## 2019-01-10 LAB — LIPASE: Lipase: 55 U/L (ref 13–78)

## 2019-01-10 LAB — HEMOGLOBIN A1C
Est. average glucose Bld gHb Est-mCnc: >398 mg/dL
Hgb A1c MFr Bld: >15.5 % — ABNORMAL HIGH (ref 4.8–5.6)

## 2019-01-11 LAB — URINE CULTURE: Organism ID, Bacteria: NO GROWTH

## 2019-01-13 ENCOUNTER — Other Ambulatory Visit: Payer: Self-pay | Admitting: Physician Assistant

## 2019-01-13 ENCOUNTER — Other Ambulatory Visit: Payer: Self-pay

## 2019-01-13 MED ORDER — METFORMIN HCL 500 MG PO TABS: 500.0000 mg | ORAL_TABLET | Freq: Two times a day (BID) | ORAL | 3 refills | Status: DC

## 2019-01-13 NOTE — Progress Notes (Signed)
Subjective:    Patient ID: [name], male    DOB: 03-29-1970, 48 y.o.   MRN: [id number]  48 y.o.M here for T2DM  HTN f/u from 12/10 tele vist and f/u Labs:  BS 577 and HgbA1C >15  This patient is seen back in follow-up status post a phone visit on December 9 when the patient had 3 weeks of constipation, nausea, poor appetite, emesis after eating, weight loss, polydipsia and polyuria.  The patient's labs were subsequently returned showing a blood sugar of 577 hemoglobin A1c of greater than 15.  The patient is in today for further follow-up.  Note he drinks 2 quarts of beer daily.  Lipase was mildly elevated at 55.  The patient denies any abdominal pain at this time.  Today's quite weak as he comes in and he is quite ketotic on his urine dipstick and blood sugar continues to remain elevated nearly at 500.  There is no pre-existing history of diabetes in the past.  He does have dental cavities and periodontal disease noted previously and does have history of hypertension.     Past Medical History:  Diagnosis Date  . Renal disorder      Family History  Problem Relation Age of Onset  . Stroke Father   . Hypertension Father      Social History   Socioeconomic History  . Marital status: Married    Spouse name: Not on file  . Number of children: Not on file  . Years of education: Not on file  . Highest education level: Not on file  Occupational History  . Not on file  Tobacco Use  . Smoking status: Current Every Day Smoker    Packs/day: 0.50    Types: Cigarettes  . Smokeless tobacco: Never Used  Substance and Sexual Activity  . Alcohol use: Yes    Alcohol/week: 12.0 standard drinks    Types: 12 Cans of beer per week  . Drug use: No  . Sexual activity: Not Currently  Other Topics Concern  . Not on file  Social History Narrative  . Not on file   Social Determinants of Health   Financial Resource Strain:   . Difficulty of Paying Living Expenses: Not on file  Food  Insecurity:   . Worried About Charity fundraiser in the Last Year: Not on file  . Ran Out of Food in the Last Year: Not on file  Transportation Needs:   . Lack of Transportation (Medical): Not on file  . Lack of Transportation (Non-Medical): Not on file  Physical Activity:   . Days of Exercise per Week: Not on file  . Minutes of Exercise per Session: Not on file  Stress:   . Feeling of Stress : Not on file  Social Connections:   . Frequency of Communication with Friends and Family: Not on file  . Frequency of Social Gatherings with Friends and Family: Not on file  . Attends Religious Services: Not on file  . Active Member of Clubs or Organizations: Not on file  . Attends Archivist Meetings: Not on file  . Marital Status: Not on file  Intimate Partner Violence:   . Fear of Current or Ex-Partner: Not on file  . Emotionally Abused: Not on file  . Physically Abused: Not on file  . Sexually Abused: Not on file     No Known Allergies   Outpatient Medications Prior to Visit  Medication Sig Dispense Refill  . amLODipine (NORVASC)  5 MG tablet Take 1.5 tablets (7.5 mg total) by mouth daily. 90 tablet 3  . atorvastatin (LIPITOR) 10 MG tablet Take 1 tablet (10 mg total) by mouth daily. 90 tablet 3  . meclizine (ANTIVERT) 25 MG tablet Take 1 tablet (25 mg total) by mouth 3 (three) times daily as needed for dizziness. 30 tablet 0  . omeprazole (PRILOSEC) 40 MG capsule Take 1 capsule (40 mg total) by mouth daily. 30 capsule 5  . ondansetron (ZOFRAN) 8 MG tablet Take 1 tablet (8 mg total) by mouth every 8 (eight) hours as needed for nausea or vomiting. 30 tablet 0  . oxymetazoline (AFRIN NASAL SPRAY) 0.05 % nasal spray Place 1 spray into both nostrils 2 (two) times daily as needed for congestion. 30 mL 0  . metFORMIN (GLUCOPHAGE) 500 MG tablet Take 1 tablet (500 mg total) by mouth 2 (two) times daily with a meal. X 1 week then 2 tabs twice daily thereafter. 180 tablet 3   No  facility-administered medications prior to visit.     Review of Systems Constitutional:   weight loss, night sweats,  Fevers, chills, fatigue, lassitude. HEENT:    headaches,  Difficulty swallowing,  Tooth/dental problems,  Sore throat,                No sneezing, itching, ear ache, nasal congestion, post nasal drip,   CV:  No chest pain,  Orthopnea, PND, swelling in lower extremities, anasarca, dizziness, palpitations  GI   heartburn, indigestion, abdominal pain, nausea, vomiting, diarrhea, change in bowel habits, loss of appetite  Resp: No shortness of breath with exertion or at rest.  No excess mucus, no productive cough,  No non-productive cough,  No coughing up of blood.  No change in color of mucus.  No wheezing.  No chest wall deformity  Skin: no rash or lesions.  GU: no dysuria, change in color of urine,  urgency or frequency.  No flank pain.  MS:  No joint pain or swelling.  No decreased range of motion.  No back pain.  Psych:  No change in mood or affect. No depression or anxiety.  No memory loss.     Objective:   Physical Exam Vitals:   01/14/19 1044  BP: 136/90  Pulse: 93  Resp: 16  SpO2: 95%  Weight: 232 lb 9.6 oz (105.5 kg)    Gen: Pleasant, poor skin turgor and volume depleted, in no distress,  normal affect  ENT: No lesions, dry mucous membranes very poor dentition with several dental caries and severe periodontal disease mouth clear,  oropharynx clear, no postnasal drip  Neck: No JVD, no TMG, no carotid bruits  Lungs: No use of accessory muscles, no dullness to percussion, clear without rales or rhonchi  Cardiovascular: RRR, heart sounds normal, no murmur or gallops, no peripheral edema  Abdomen: soft and NT, no HSM,  BS normal  Musculoskeletal: No deformities, no cyanosis or clubbing  Neuro: alert, non focal  Skin: Warm, no lesions or rashes CBG (last 3)  470>>406>>316  Lab Results  Component Value Date   HGBA1C >15.5 (H) 01/09/2019   BMP  Latest Ref Rng & Units 01/09/2019 08/03/2018 11/23/2017  Glucose 65 - 99 mg/dL 577(HH) 132(H) 91  BUN 6 - 24 mg/dL _0 Creatinine 0.76 - 1.27 mg/dL 1.10 1.46(H) 1.03  BUN/Creat Ratio 9 - 20 10 - 11  Sodium 134 - 144 mmol/L 127(L) 140 139  Potassium 3.5 - 5.2 mmol/L 4.3 3.7 4.9  Chloride 96 - 106 mmol/L 89(L) 105 101  CO2 20 - 29 mmol/L 20 21(L) 22  Calcium 8.7 - 10.2 mg/dL 9.4 9.2 10.2   CBC Latest Ref Rng & Units 01/09/2019 08/03/2018 10/04/2017  WBC 3.4 - 10.8 x10E3/uL 9.5 18.5(H) 11.7(H)  Hemoglobin 13.0 - 17.7 g/dL 13.7 15.0 15.3  Hematocrit 37.5 - 51.0 % 40.3 44.1 45.4  Platelets 150 - 450 x10E3/uL 394 384 380   Lipase     Component Value Date/Time   LIPASE 55 01/09/2019 1022   Hepatic Function Latest Ref Rng & Units 01/09/2019 08/03/2018 11/23/2017  Total Protein 6.0 - 8.5 g/dL 6.7 6.7 7.3  Albumin 4.0 - 5.0 g/dL 3.8(L) 4.1 4.7  AST 0 - 40 IU/L 13 45(H) 18  ALT 0 - 44 IU/L _0 Alk Phosphatase 39 - 117 IU/L 165(H) 66 69  Total Bilirubin 0.0 - 1.2 mg/dL 0.2 0.7 0.3        Assessment & Plan:  I personally reviewed all images and lab data in the [facility] system as well as any outside material available during this office visit and agree with the  radiology impressions.   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 on dipstick which was reduced after volume repletion  See diabetes assessment  Uncontrolled type 2 diabetes mellitus with hyperglycemia ([place]) Uncontrolled diabetes new onset  Significant volume depletion today therefore we gave the patient an intravenous bolus of 1 L saline after which the patient did feel considerably better in follow-up after 2 doses of subcutaneous insulin 20 units  each the patient's blood sugar was down to 316  We will discontinue further Metformin and begin Lantus 20 units daily at bedtime and NovoLog 6 units 3 times daily with meals  A glucometer with testing supplies will be given to the patient today we will also follow-up with a CBC and complete metabolic panel at this visit along with lipid panel  Patient will return in a week for follow-up with our clinical pharmacist and return to see either Dr. Joya Gaskins or Dr. Wynetta Emery the first week of January    Hyperlipidemia with target LDL less than 100 Have recheck lipid panel and will maintain Lipitor for now 10 mg daily  Hyponatremia Hyponatremia previously was on the basis of elevated blood sugars we will follow-up metabolic panel to develop panel today  Tobacco dependence We discussed the issue of tobacco dependence today briefly we will need to follow this up at the next visit  Volume depletion  as mentioned intravenous saline 1 L was given at this visit   Gra[place] was seen today for diabetes.  Diagnoses and all orders for this visit:  Uncontrolled type 2 diabetes mellitus with hyperglycemia ([facility]CC) -     Glucose (CBG) -     POCT URINALYSIS DIP (CLINITEK) -     Discontinue: sodium chloride (PF) 0.9 % injection 1,000 mL -     Lipid panel -     Comprehensive metabolic panel -     CBC with Differential/Platelet; Future -     sodium chloride 0.9 % bolus 1,000 mL -     Discontinue: insulin aspart (novoLOG) injection 10 Units -     insulin aspart (novoLOG) injection 20 Units -  insulin aspart (novoLOG) injection 20 Units -     Glucose (CBG) -     Microalbumin/Creatinine Ratio, Urine -     CBC with Differential/Platelet -     Glucose (CBG) -     POCT URINALYSIS DIP (CLINITEK)  Hyponatremia  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 -     glucose blood test strip; Use as instructed to check blood sugar 3 times daily. -     TRUEplus Lancets 28G MISC; Use as instructed to check blood sugar 3 times daily. -     insulin glargine (LANTUS) 100 unit/mL SOPN; Inject 0.2 mLs (20 Units total) into the skin daily. -     insulin aspart (NOVOLOG FLEXPEN) 100 UNIT/ML FlexPen; 6 units three times daily before meals -     Insulin Pen Needle 29G X 12MM MISC; Use with insulin pen -     Discontinue: pantoprazole (PROTONIX) 20 MG tablet; Take 1 tablet (20 mg total) by mouth daily.

## 2019-01-14 ENCOUNTER — Encounter: Payer: Self-pay | Admitting: Critical Care Medicine

## 2019-01-14 ENCOUNTER — Ambulatory Visit: Payer: Medicaid Other | Admitting: Pharmacist

## 2019-01-14 ENCOUNTER — Other Ambulatory Visit: Payer: Self-pay

## 2019-01-14 ENCOUNTER — Ambulatory Visit: Payer: Self-pay | Attending: Critical Care Medicine | Admitting: Critical Care Medicine

## 2019-01-14 VITALS — BP 136/90 | HR 93 | Resp 16 | Wt 232.6 lb

## 2019-01-14 DIAGNOSIS — F17200 Nicotine dependence, unspecified, uncomplicated: Secondary | ICD-10-CM

## 2019-01-14 DIAGNOSIS — E1165 Type 2 diabetes mellitus with hyperglycemia: Secondary | ICD-10-CM

## 2019-01-14 DIAGNOSIS — E1110 Type 2 diabetes mellitus with ketoacidosis without coma: Secondary | ICD-10-CM

## 2019-01-14 DIAGNOSIS — K029 Dental caries, unspecified: Secondary | ICD-10-CM

## 2019-01-14 DIAGNOSIS — Z79899 Other long term (current) drug therapy: Secondary | ICD-10-CM

## 2019-01-14 DIAGNOSIS — F1090 Alcohol use, unspecified, uncomplicated: Secondary | ICD-10-CM

## 2019-01-14 DIAGNOSIS — E119 Type 2 diabetes mellitus without complications: Secondary | ICD-10-CM | POA: Insufficient documentation

## 2019-01-14 DIAGNOSIS — Z7289 Other problems related to lifestyle: Secondary | ICD-10-CM

## 2019-01-14 DIAGNOSIS — E1310 Other specified diabetes mellitus with ketoacidosis without coma: Secondary | ICD-10-CM | POA: Insufficient documentation

## 2019-01-14 DIAGNOSIS — I10 Essential (primary) hypertension: Secondary | ICD-10-CM

## 2019-01-14 DIAGNOSIS — F17210 Nicotine dependence, cigarettes, uncomplicated: Secondary | ICD-10-CM

## 2019-01-14 DIAGNOSIS — Z789 Other specified health status: Secondary | ICD-10-CM

## 2019-01-14 DIAGNOSIS — E871 Hypo-osmolality and hyponatremia: Secondary | ICD-10-CM

## 2019-01-14 DIAGNOSIS — E869 Volume depletion, unspecified: Secondary | ICD-10-CM

## 2019-01-14 DIAGNOSIS — E785 Hyperlipidemia, unspecified: Secondary | ICD-10-CM

## 2019-01-14 LAB — POCT URINALYSIS DIP (CLINITEK)
Bilirubin, UA: NEGATIVE
Bilirubin, UA: NEGATIVE
Blood, UA: NEGATIVE
Glucose, UA: >=1000 mg/dL — AB
Glucose, UA: >=1000 mg/dL — AB
Leukocytes, UA: NEGATIVE
Nitrite, UA: NEGATIVE
Nitrite, UA: NEGATIVE
POC PROTEIN,UA: NEGATIVE
POC PROTEIN,UA: NEGATIVE
Spec Grav, UA: 1.02 (ref 1.010–1.025)
Spec Grav, UA: 1.02 (ref 1.010–1.025)
Urobilinogen, UA: NEGATIVE E.U./dL — AB
Urobilinogen, UA: 0.2 E.U./dL
pH, UA: 5 (ref 5.0–8.0)
pH, UA: 6 (ref 5.0–8.0)

## 2019-01-14 LAB — GLUCOSE, POCT (MANUAL RESULT ENTRY)
POC Glucose: 316 mg/dl — AB (ref 70–99)
POC Glucose: 405 mg/dl — AB (ref 70–99)
POC Glucose: 499 mg/dl — AB (ref 70–99)

## 2019-01-14 MED ORDER — INSULIN ASPART 100 UNIT/ML ~~LOC~~ SOLN: 10.0000 [IU] | Freq: Once | SUBCUTANEOUS | Status: DC

## 2019-01-14 MED ORDER — INSULIN PEN NEEDLE 29G X 12MM MISC: 0 refills | Status: DC

## 2019-01-14 MED ORDER — GLUCOSE BLOOD VI STRP: ORAL_STRIP | 11 refills | Status: DC

## 2019-01-14 MED ORDER — PANTOPRAZOLE SODIUM 20 MG PO TBEC: 20.0000 mg | DELAYED_RELEASE_TABLET | Freq: Every day | ORAL | 0 refills | Status: DC

## 2019-01-14 MED ORDER — TRUE METRIX METER W/DEVICE KIT: PACK | 0 refills | Status: DC

## 2019-01-14 MED ORDER — INSULIN ASPART 100 UNIT/ML ~~LOC~~ SOLN: 20.0000 [IU] | Freq: Once | SUBCUTANEOUS | Status: DC

## 2019-01-14 MED ORDER — SODIUM CHLORIDE 0.9 % IV BOLUS: 1000.0000 mL | Freq: Once | INTRAVENOUS | Status: DC

## 2019-01-14 MED ORDER — TRUEPLUS LANCETS 28G MISC: 11 refills | Status: DC

## 2019-01-14 MED ORDER — INSULIN GLARGINE 100 UNITS/ML SOLOSTAR PEN: 20.0000 [IU] | PEN_INJECTOR | Freq: Every day | SUBCUTANEOUS | 6 refills | Status: DC

## 2019-01-14 MED ORDER — NOVOLOG FLEXPEN 100 UNIT/ML ~~LOC~~ SOPN: PEN_INJECTOR | SUBCUTANEOUS | 4 refills | Status: DC

## 2019-01-14 MED ORDER — SODIUM CHLORIDE (PF) 0.9 % IJ SOLN: 1000.0000 mL | Freq: Once | INTRAMUSCULAR | Status: DC

## 2019-01-14 MED FILL — TRUEPLUS PEN NDL 31GX5/16": 31G X 8 MM | 33 days supply | Qty: 100 | Fill #0

## 2019-01-14 MED FILL — TRUEPLUS PEN NDL 31GX5/16: 31G X 8 MM | 33 days supply | Qty: 100 | Fill #0

## 2019-01-14 MED FILL — TRUEplus LANCETS 28G MISC: 33 days supply | Qty: 100 | Fill #0

## 2019-01-14 MED FILL — !TRUE METRIX BLOOD GLUCOSE: 1 days supply | Qty: 1 | Fill #0

## 2019-01-14 MED FILL — ?BASAGLAR 100 UNITS/ML KWPE: 100 | 30 days supply | Qty: 6 | Fill #0

## 2019-01-14 MED FILL — ?HUMALOG 100 UNITS/ML KWIKP: 100 | 33 days supply | Qty: 6 | Fill #0

## 2019-01-14 MED FILL — TRUE METRIX GLUCOSE TEST ST: 33 days supply | Qty: 100 | Fill #0

## 2019-01-14 NOTE — Assessment & Plan Note (Signed)
History of hypertension well-controlled at this time with  volume depletion Plan will be to continue amlodipine 7.5 mg daily

## 2019-01-14 NOTE — Assessment & Plan Note (Signed)
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

## 2019-01-14 NOTE — Assessment & Plan Note (Signed)
Uncontrolled diabetes new onset  Significant volume depletion today therefore we gave the patient an intravenous bolus of 1 L saline after which the patient did feel considerably better in follow-up after 2 doses of subcutaneous insulin 20 units each the patient's blood sugar was down to 316  We will discontinue further Metformin and begin Lantus 20 units daily at bedtime and NovoLog 6 units 3 times daily with meals  A glucometer with testing supplies will be given to the patient today we will also follow-up with a CBC and complete metabolic panel at this visit along with lipid panel  Patient will return in a week for follow-up with our clinical pharmacist and return to see either Dr. Joya Gaskins or Dr. Wynetta Emery the first week of January

## 2019-01-14 NOTE — Assessment & Plan Note (Addendum)
Hyponatremia previously was on the basis of elevated blood sugars we will follow-up metabolic panel to develop panel today

## 2019-01-14 NOTE — Assessment & Plan Note (Signed)
We discussed the issue of tobacco dependence today briefly we will need to follow this up at the next visit

## 2019-01-14 NOTE — Assessment & Plan Note (Signed)
Have recheck lipid panel and will maintain Lipitor for now 10 mg daily

## 2019-01-14 NOTE — Progress Notes (Signed)
Patient was educated on the use of the True Metrix blood glucose meter and Lantus/Novolog pens. Reviewed necessary supplies and operation of the meter and pens. Also reviewed goal blood glucose levels. Patient was able to demonstrate use. All questions and concerns were addressed.

## 2019-01-14 NOTE — Assessment & Plan Note (Signed)
as mentioned intravenous saline 1 L was given at this visit

## 2019-01-14 NOTE — Assessment & Plan Note (Signed)
Severe dental cavities and also periodontal disease  I gave the patient referrals for dental care

## 2019-01-14 NOTE — Patient Instructions (Addendum)
Please avoid further alcohol use  Push oral fluids at this time  Begin Lantus 20 units daily  Begin NovoLog 6 units 3 times daily before meals  Discontinue Metformin at this time  Use zofran as needed for nausea  Stay on Protonix daily  A glucose test meter was given please check your blood sugars at least twice daily in the morning and evening  Labs today will include a metabolic panel and blood count and cholesterol levels  Return for short-term follow-up in 1 week

## 2019-01-15 ENCOUNTER — Telehealth: Payer: Self-pay

## 2019-01-15 LAB — LIPID PANEL
Chol/HDL Ratio: 15.5 ratio — ABNORMAL HIGH (ref 0.0–5.0)
Cholesterol, Total: 326 mg/dL — ABNORMAL HIGH (ref 100–199)
HDL: 21 mg/dL — ABNORMAL LOW (ref >39)
Triglycerides: 1557 mg/dL (ref 0–149)

## 2019-01-15 LAB — COMPREHENSIVE METABOLIC PANEL
ALT: 11 IU/L (ref 0–44)
AST: 11 IU/L (ref 0–40)
Albumin/Globulin Ratio: 1.5 (ref 1.2–2.2)
Albumin: 3.5 g/dL — ABNORMAL LOW (ref 4.0–5.0)
Alkaline Phosphatase: 126 IU/L — ABNORMAL HIGH (ref 39–117)
BUN/Creatinine Ratio: 15 (ref 9–20)
BUN: 11 mg/dL (ref 6–24)
Bilirubin Total: <0.2 mg/dL (ref 0.0–1.2)
CO2: 18 mmol/L — ABNORMAL LOW (ref 20–29)
Calcium: 8.5 mg/dL — ABNORMAL LOW (ref 8.7–10.2)
Chloride: 95 mmol/L — ABNORMAL LOW (ref 96–106)
Creatinine, Ser: 0.74 mg/dL — ABNORMAL LOW (ref 0.76–1.27)
GFR calc Af Amer: 127 mL/min/{1.73_m2} (ref >59)
GFR calc non Af Amer: 110 mL/min/{1.73_m2} (ref >59)
Globulin, Total: 2.4 g/dL (ref 1.5–4.5)
Glucose: 339 mg/dL — ABNORMAL HIGH (ref 65–99)
Potassium: 3.8 mmol/L (ref 3.5–5.2)
Sodium: 130 mmol/L — ABNORMAL LOW (ref 134–144)
Total Protein: 5.9 g/dL — ABNORMAL LOW (ref 6.0–8.5)

## 2019-01-15 LAB — URINE CYTOLOGY ANCILLARY ONLY
Bacterial Vaginitis-Urine: NEGATIVE
Candida Urine: NEGATIVE
Chlamydia: NEGATIVE
Comment: NEGATIVE
Comment: NEGATIVE
Comment: NORMAL
Neisseria Gonorrhea: NEGATIVE
Trichomonas: NEGATIVE

## 2019-01-15 LAB — CBC WITH DIFFERENTIAL/PLATELET
Basophils Absolute: 0 10*3/uL (ref 0.0–0.2)
Basos: 1 %
EOS (ABSOLUTE): 0.1 10*3/uL (ref 0.0–0.4)
Eos: 2 %
Hematocrit: 37.9 % (ref 37.5–51.0)
Hemoglobin: 13.2 g/dL (ref 13.0–17.7)
Immature Grans (Abs): 0 10*3/uL (ref 0.0–0.1)
Immature Granulocytes: 0 %
Lymphocytes Absolute: 2.3 10*3/uL (ref 0.7–3.1)
Lymphs: 30 %
MCH: 31.9 pg (ref 26.6–33.0)
MCHC: 34.8 g/dL (ref 31.5–35.7)
MCV: 92 fL (ref 79–97)
Monocytes Absolute: 0.6 10*3/uL (ref 0.1–0.9)
Monocytes: 8 %
Neutrophils Absolute: 4.7 10*3/uL (ref 1.4–7.0)
Neutrophils: 59 %
Platelets: 333 10*3/uL (ref 150–450)
RBC: 4.14 x10E6/uL (ref 4.14–5.80)
RDW: 12.2 % (ref 11.6–15.4)
WBC: 7.8 10*3/uL (ref 3.4–10.8)

## 2019-01-15 LAB — MICROALBUMIN / CREATININE URINE RATIO
Creatinine, Urine: 38.8 mg/dL
Microalb/Creat Ratio: <8 mg/g creat (ref 0–29)
Microalbumin, Urine: <3 ug/mL

## 2019-01-15 NOTE — Telephone Encounter (Signed)
Pt wife contacted the office and stated that pt checked his  Blood sugar and it was 275. He did 6 units of Novolog before eating. Pt had [employer] and he had a breast wing and green beans. Pt wife stated after eating 30-45 mins ago pt blood sugar is 502.  Went and spoke with luke and per Lurena Joiner pt will need to be seen in the ED. Went and informed pt wife that pt will need to be seen in the ED. Pt seemed as if he didn't want to go.   Went and spoke with Lurena Joiner and informed him that pt seem like he doesn't want to go to the ED. Per Lurena Joiner he can take an additional 10 units of Novolog and push fluids, but if it doesn't go down he will need to go to the ED.   Went back on the line and spoke with pt wife and made aware of Lurena Joiner advice. Pt wife doesn't have any questions or concerns

## 2019-01-15 NOTE — Telephone Encounter (Signed)
Contacted pt to go over lab results and to schedule pt an appointment.   Pt is aware of results and doesn't have any questions or concerns

## 2019-01-16 ENCOUNTER — Telehealth: Payer: Self-pay | Admitting: Critical Care Medicine

## 2019-01-16 DIAGNOSIS — K860 Alcohol-induced chronic pancreatitis: Secondary | ICD-10-CM

## 2019-01-16 DIAGNOSIS — K861 Other chronic pancreatitis: Secondary | ICD-10-CM | POA: Insufficient documentation

## 2019-01-16 DIAGNOSIS — E781 Pure hyperglyceridemia: Secondary | ICD-10-CM | POA: Insufficient documentation

## 2019-01-16 MED ORDER — FENOFIBRATE 145 MG PO TABS: 145.0000 mg | ORAL_TABLET | Freq: Every day | ORAL | 3 refills | Status: DC

## 2019-01-16 MED FILL — FENOFIBRATE 145 MG TABLET: 145 | 30 days supply | Qty: 30 | Fill #0

## 2019-01-16 MED FILL — ?ATORVASTATIN 10 MG TABLET: 10 | 30 days supply | Qty: 30 | Fill #1

## 2019-01-16 MED FILL — OMEPRAZOLE DR 40 MG CAPSULE: 40 | 30 days supply | Qty: 30 | Fill #3

## 2019-01-16 MED FILL — AMLODIPINE BESYLATE 5 MG TA: 5 | 30 days supply | Qty: 45 | Fill #7

## 2019-01-16 NOTE — Telephone Encounter (Signed)
I touched based with the patient and his wife regarding his new rx for fenofibrate. We discussed labs and recommendations from Dr. Joya Gaskins. Additionally, pt admits to drinking fruit punch yesterday, which is why he called in yesterday afternoon with a CBG > 500. He took an extra 10 units of Novolog and pushed fluids as instructed by me, and reveals that his sugar was down to 383 thirty minutes after insulin administration. I instructed him to increase basal insulin dose to 26 units and avoid all added sugar. Additionally, we emphasized the importance of abstaining from alcohol and avoiding fatty foods.

## 2019-01-16 NOTE — Telephone Encounter (Signed)
I have tried to call this patient ,  Can you try to reach out today to the patient [name] or [name]  His TG is >1500 and is causing pancreatitis which exacerbated his hyperglycemia.  He needs to avoid alcohol>>>please get him connected with Jasmine.  I Rx fenofibrate for him  He needs a strict low fat diet, can  One of you provide that for him and his wife.  [name] you will be seeing him next.   I added Dr Wynetta Emery to this

## 2019-01-16 NOTE — Telephone Encounter (Signed)
Thank you Luke :)

## 2019-01-21 ENCOUNTER — Ambulatory Visit: Payer: Self-pay | Attending: Internal Medicine | Admitting: Pharmacist

## 2019-01-21 ENCOUNTER — Other Ambulatory Visit: Payer: Self-pay

## 2019-01-21 DIAGNOSIS — E1165 Type 2 diabetes mellitus with hyperglycemia: Secondary | ICD-10-CM

## 2019-01-21 NOTE — Progress Notes (Signed)
    S:    PcP: Dr. Wynetta Emery   No chief complaint on file.  Patient arrives in good spirits.  Presents for diabetes evaluation, education, and management. Patient was referred on 01/14/19 by Dr. Joya Gaskins.   Family/Social History:  - FHx: HTN, stroke  - Tobacco:  Current 0.5 PPD smoker - Alcohol: abstinence important give severely elevated TG/pancreatitis   Insurance coverage/medication affordability: Dnbi  Patient reports adherence with medications.  Current diabetes medications include: Lantus 26 units daily, Novolog 6 units TID Current hypertension medications include: amlodipine 5 mg daily Current hyperlipidemia medications include: atorvastatin 10 mg daily, fenofibrate 145 mg daily   Patient denies hypoglycemic events.  Patient reported dietary habits:  - Patient reports eliminating added sugars - Has switched to diet drinks - Reports eliminating fried and processed foods   Patient-reported exercise habits:  - Walking 15-20 minutes   Patient reports improvement in polyuria, polydipsia. Patient denies neuropathy (nerve pain). Patient reports some blurred vision changes. Patient reports self foot exams.    O:  POCT glucose: 287  Lab Results  Component Value Date   HGBA1C >15.5 (H) 01/09/2019   There were no vitals filed for this visit.  Lipid Panel     Component Value Date/Time   CHOL 326 (H) 01/14/2019 1205   TRIG 1,557 (HH) 01/14/2019 1205   HDL 21 (L) 01/14/2019 1205   CHOLHDL 15.5 (H) 01/14/2019 1205   CHOLHDL 5.2 08/25/2013 1746   VLDL 52 (H) 08/25/2013 1746   LDLCALC Comment (A) 01/14/2019 1205   Home fasting blood sugars: 187 - 230s  2 hour post-meal/random blood sugars: 230-287.  Clinical Atherosclerotic Cardiovascular Disease (ASCVD): No  The ASCVD Risk score Mikey Bussing DC Jr., et al., 2013) failed to calculate for the following reasons:   The valid total cholesterol range is 130 to 320 mg/dL   A/P: Diabetes newly dx currently uncontrolled but home  sugars are improving. Patient is able to verbalize appropriate hypoglycemia management plan. Patient is adherent with medication.  -Increased dose of Lantus to 32 units daily.  -Continued Novolog 6 units TID.  -Extensively discussed pathophysiology of diabetes, recommended lifestyle interventions, dietary effects on blood sugar control -Counseled on s/sx of and management of hypoglycemia -Next A1C anticipated 03/2019.   Written patient instructions provided.  Total time in face to face counseling 15 minutes.   Follow up Pharmacist Clinic Visit in 1 week.    Benard Halsted, PharmD, [place] [phone]

## 2019-01-22 ENCOUNTER — Encounter: Payer: Self-pay | Admitting: Pharmacist

## 2019-01-22 MED ORDER — LANTUS SOLOSTAR 100 UNIT/ML ~~LOC~~ SOPN: 32.0000 [IU] | PEN_INJECTOR | Freq: Every day | SUBCUTANEOUS | 2 refills | Status: DC

## 2019-01-28 ENCOUNTER — Encounter

## 2019-01-28 ENCOUNTER — Ambulatory Visit: Payer: Medicaid Other | Admitting: Pharmacist

## 2019-01-29 ENCOUNTER — Other Ambulatory Visit: Payer: Self-pay

## 2019-01-29 ENCOUNTER — Ambulatory Visit: Payer: Self-pay | Attending: Internal Medicine | Admitting: Pharmacist

## 2019-01-29 DIAGNOSIS — E1165 Type 2 diabetes mellitus with hyperglycemia: Secondary | ICD-10-CM

## 2019-01-29 LAB — GLUCOSE, POCT (MANUAL RESULT ENTRY): POC Glucose: 157 mg/dl — AB (ref 70–99)

## 2019-01-29 NOTE — Progress Notes (Signed)
    S:    PcP: Dr. Wynetta Emery   No chief complaint on file.  Patient arrives in good spirits.  Presents for diabetes evaluation, education, and management. Patient was referred on 01/14/19 by Dr. Joya Gaskins. I last saw him on 01/21/19 and adjusted his Lantus dose.   Today, he reports feeling better with improved glycemic control at home. Interestingly, he has gone without RA insulin for the past several days with his highest post-prandial/random reading being in the 180s.   Family/Social History:  - FHx: HTN, stroke  - Tobacco:  Current 0.5 PPD smoker - Alcohol: abstinence important give severely elevated TG/pancreatitis   Insurance coverage/medication affordability: Dnbi  Patient reports adherence with Lantus but not Novolog.  Current diabetes medications include: Lantus 32 units daily, Novolog 6 units TID (holding) Current hypertension medications include: amlodipine 5 mg daily Current hyperlipidemia medications include: atorvastatin 10 mg daily, fenofibrate 145 mg daily   Patient denies hypoglycemic events.  Patient reported dietary habits:  - Patient reports eliminating added sugars - Has switched to diet drinks - Reports eliminating fried and processed foods   Patient-reported exercise habits:  - Walking 15-20 minutes   Patient reports improvement in polyuria, polydipsia. Patient denies neuropathy (nerve pain). Patient reports some blurred vision changes. Patient reports self foot exams.    O:  POCT glucose: 157  Lab Results  Component Value Date   HGBA1C >15.5 (H) 01/09/2019   There were no vitals filed for this visit.  Lipid Panel     Component Value Date/Time   CHOL 326 (H) 01/14/2019 1205   TRIG 1,557 (HH) 01/14/2019 1205   HDL 21 (L) 01/14/2019 1205   CHOLHDL 15.5 (H) 01/14/2019 1205   CHOLHDL 5.2 08/25/2013 1746   VLDL 52 (H) 08/25/2013 1746   LDLCALC Comment (A) 01/14/2019 1205   Home fasting blood sugars: 110s - 140s  2 hour post-meal/random blood  sugars: mid-high 100s   Clinical Atherosclerotic Cardiovascular Disease (ASCVD): No  The ASCVD Risk score Mikey Bussing DC Jr., et al., 2013) failed to calculate for the following reasons:   The valid total cholesterol range is 130 to 320 mg/dL   A/P: Diabetes newly dx currently uncontrolled but home sugars are improving. Patient is able to verbalize appropriate hypoglycemia management plan. Patient is adherent with Lantus but is not taking Novolog. He maintains pretty good control without Novolog. I recommend that he obtain refills for Novolog and keep in the event of hyperglycemia. However, he can continue with Lantus for now. He has made tremendous changes in his lifestyle that are certainly aiding in control.   Discussed the possibility of starting oral medications such as metformin, but would like updated blood work before doing that. We need to make sure his renal function is stable and that he is not at risk of DKA. He has an appointment with his PCP in ~1 month, at which point we can re-evaluate.   -Continue Lantus to 32 units daily.  -Hold Novolog for now.  -Extensively discussed pathophysiology of diabetes, recommended lifestyle interventions, dietary effects on blood sugar control -Counseled on s/sx of and management of hypoglycemia -Next A1C anticipated 03/2019.   Written patient instructions provided.  Total time in face to face counseling 15 minutes.   Follow up Pharmacist Clinic Visit in 1 month.    Benard Halsted, PharmD, [place] [phone]

## 2019-02-05 MED FILL — ?BASAGLAR 100 UNITS/ML KWPE: 100 | 28 days supply | Qty: 9 | Fill #0

## 2019-02-14 ENCOUNTER — Other Ambulatory Visit: Payer: Self-pay | Admitting: Internal Medicine

## 2019-02-14 DIAGNOSIS — I10 Essential (primary) hypertension: Secondary | ICD-10-CM

## 2019-02-14 MED FILL — ?ATORVASTATIN 10 MG TABLET: 10 | 30 days supply | Qty: 30 | Fill #2

## 2019-02-14 MED FILL — FENOFIBRATE 145 MG TABLET: 145 | 30 days supply | Qty: 30 | Fill #1

## 2019-02-14 MED FILL — OMEPRAZOLE DR 40 MG CAPSULE: 40 | 30 days supply | Qty: 30 | Fill #4

## 2019-02-17 MED FILL — AMLODIPINE BESYLATE 5 MG TA: 5 | 30 days supply | Qty: 45 | Fill #0

## 2019-02-28 ENCOUNTER — Ambulatory Visit: Payer: Medicaid Other | Admitting: Internal Medicine

## 2019-03-05 ENCOUNTER — Other Ambulatory Visit: Payer: Self-pay

## 2019-03-05 ENCOUNTER — Ambulatory Visit: Payer: Self-pay | Attending: Internal Medicine | Admitting: Pharmacist

## 2019-03-05 DIAGNOSIS — E1165 Type 2 diabetes mellitus with hyperglycemia: Secondary | ICD-10-CM

## 2019-03-05 NOTE — Progress Notes (Signed)
    S:    PcP: Dr. Wynetta Emery   No chief complaint on file.  Patient arrives in good spirits.  Presents for diabetes evaluation, education, and management. Patient was referred on 01/14/19 by Dr. Joya Gaskins. I last saw him on 01/29/19 and adjusted his Lantus dose.   Today, he reports feeling better with improved glycemic control at home.   Family/Social History:  - FHx: HTN, stroke  - Tobacco:  Current 0.5 PPD smoker - Alcohol: abstinence important give severely elevated TG/pancreatitis   Insurance coverage/medication affordability: Dnbi  Patient reports adherence with Lantus. Current diabetes medications include: Lantus 32 units daily Current hypertension medications include: amlodipine 5 mg daily Current hyperlipidemia medications include: atorvastatin 10 mg daily, fenofibrate 145 mg daily   Patient denies hypoglycemic events.  Patient reported dietary habits:  - Patient reports eliminating added sugars - Has switched to diet drinks - Reports eliminating fried and processed foods   Patient-reported exercise habits:  - Walking 15-20 minutes   Patient reports improvement in polyuria, polydipsia. Patient denies neuropathy (nerve pain). Patient reports some blurred vision changes. Patient reports self foot exams.    O:  POCT glucose: 136  Lab Results  Component Value Date   HGBA1C >15.5 (H) 01/09/2019   There were no vitals filed for this visit.  Lipid Panel     Component Value Date/Time   CHOL 326 (H) 01/14/2019 1205   TRIG 1,557 (HH) 01/14/2019 1205   HDL 21 (L) 01/14/2019 1205   CHOLHDL 15.5 (H) 01/14/2019 1205   CHOLHDL 5.2 08/25/2013 1746   VLDL 52 (H) 08/25/2013 1746   LDLCALC Comment (A) 01/14/2019 1205   Home fasting blood sugars: 110s - 140s  2 hour post-meal/random blood sugars: mid-high 100s   Clinical Atherosclerotic Cardiovascular Disease (ASCVD): No  The ASCVD Risk score Mikey Bussing DC Jr., et al., 2013) failed to calculate for the following reasons:  The valid total cholesterol range is 130 to 320 mg/dL   A/P: Diabetes newly dx currently uncontrolled but home sugars are improving. Patient is able to verbalize appropriate hypoglycemia management plan. Patient is adherent with Lantus.  Discussed the possibility of starting oral medications such as metformin, but would like updated blood work before doing that. We need to make sure his renal function is stable and that he is not at risk of DKA. He has an appointment with his PCP in ~1 month, at which point we can re-evaluate.   -Continue Lantus to 32 units daily.  -Extensively discussed pathophysiology of diabetes, recommended lifestyle interventions, dietary effects on blood sugar control -Counseled on s/sx of and management of hypoglycemia -Next A1C anticipated 03/2019.   Written patient instructions provided.  Total time in face to face counseling 15 minutes.   Follow up w/ PCP next week.   Benard Halsted, PharmD, [place] [phone]

## 2019-03-13 MED FILL — ?BASAGLAR 100 UNITS/ML KWPE: 100 | 28 days supply | Qty: 9 | Fill #1

## 2019-03-17 ENCOUNTER — Ambulatory Visit: Payer: Self-pay | Attending: Internal Medicine | Admitting: Internal Medicine

## 2019-03-17 ENCOUNTER — Encounter: Payer: Self-pay | Admitting: Internal Medicine

## 2019-03-17 ENCOUNTER — Other Ambulatory Visit: Payer: Self-pay

## 2019-03-17 VITALS — BP 128/82 | HR 85 | Ht 69.0 in | Wt 249.0 lb

## 2019-03-17 DIAGNOSIS — Z8249 Family history of ischemic heart disease and other diseases of the circulatory system: Secondary | ICD-10-CM | POA: Insufficient documentation

## 2019-03-17 DIAGNOSIS — Z79899 Other long term (current) drug therapy: Secondary | ICD-10-CM | POA: Insufficient documentation

## 2019-03-17 DIAGNOSIS — F17200 Nicotine dependence, unspecified, uncomplicated: Secondary | ICD-10-CM

## 2019-03-17 DIAGNOSIS — E781 Pure hyperglyceridemia: Secondary | ICD-10-CM | POA: Insufficient documentation

## 2019-03-17 DIAGNOSIS — K219 Gastro-esophageal reflux disease without esophagitis: Secondary | ICD-10-CM | POA: Insufficient documentation

## 2019-03-17 DIAGNOSIS — K861 Other chronic pancreatitis: Secondary | ICD-10-CM | POA: Insufficient documentation

## 2019-03-17 DIAGNOSIS — Z794 Long term (current) use of insulin: Secondary | ICD-10-CM | POA: Insufficient documentation

## 2019-03-17 DIAGNOSIS — E119 Type 2 diabetes mellitus without complications: Secondary | ICD-10-CM | POA: Insufficient documentation

## 2019-03-17 DIAGNOSIS — F17210 Nicotine dependence, cigarettes, uncomplicated: Secondary | ICD-10-CM | POA: Insufficient documentation

## 2019-03-17 DIAGNOSIS — I10 Essential (primary) hypertension: Secondary | ICD-10-CM | POA: Insufficient documentation

## 2019-03-17 DIAGNOSIS — Z23 Encounter for immunization: Secondary | ICD-10-CM | POA: Insufficient documentation

## 2019-03-17 DIAGNOSIS — E785 Hyperlipidemia, unspecified: Secondary | ICD-10-CM | POA: Insufficient documentation

## 2019-03-17 LAB — GLUCOSE, POCT (MANUAL RESULT ENTRY): POC Glucose: 139 mg/dl — AB (ref 70–99)

## 2019-03-17 NOTE — Progress Notes (Signed)
Patient ID: KORBY RATAY, male    DOB: 1970-05-22  MRN: [id number]  CC: Diabetes   Subjective: [name] is a 49 y.o. male who presents for chronic ds management.  Wife is with His concerns today include:  HTN, HL, tob dep, GERD, DM  DIABETES TYPE 2 Last A1C:   Results for orders placed or performed in visit on 03/17/19  POCT glucose (manual entry)  Result Value Ref Range   POC Glucose 139 (A) 70 - 99 mg/dl    Med Adherence:  '[x]'  Yes on Lantus.  Short acting insulin was discontinued by clinical pharmacist as his blood sugars were good.    '[]'  No Medication side effects:  '[]'  Yes    '[x]'  No Home Monitoring?  '[x]'  Yes once a day, sometimes in mornings and other times in evenings Home glucose results range:120-135 Diet Adherence: '[x]'  Yes - less rice.  Changed to wheat bread.  Drinks green tea and water    Exercise: '[x]'  Yes  -walks 2 blocks 2 x/day.  Hypoglycemic episodes?: '[]'  Yes    '[x]'  No Numbness of the feet? '[]'  Yes    '[x]'  No Retinopathy hx? '[]'  Yes    '[]'  No Last eye exam: initially vision was blurred but better since BS better Comments:   HYPERTENSION Currently taking: see medication list.  He is on amlodipine Med Adherence: '[x]'  Yes    '[]'  No Medication side effects: '[x]'  Yes    '[]'  No Adherence with salt restriction: '[x]'  Yes    '[]'  No Home Monitoring?: '[]'  Yes    '[x]'  No Monitoring Frequency: '[]'  Yes    '[]'  No Home BP results range: '[]'  Yes    '[]'  No SOB? '[]'  Yes    '[x]'  No Chest Pain?: '[]'  Yes    '[x]'  No Leg swelling?: '[]'  Yes    '[x]'  No Headaches?: '[]'  Yes    '[x]'  No Dizziness? '[]'  Yes    '[x]'  No Comments:   TG: Triglyceride level was greater than 1500.  He was started on TriCor by Dr. Joya Gaskins.  He reports compliance with the TriCor and Lipitor.  Tob dep: 1 pk/day.  Not ready to quit as yet.  Patient Active Problem List   Diagnosis Date Noted  . Hypertriglyceridemia 01/16/2019  . 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 Alcohol use  01/14/2019  . Volume depletion 01/14/2019  . 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 Hyponatremia 01/14/2019  . Uncontrolled type 2 diabetes mellitus with hyperglycemia ([place]) 01/14/2019  . Medically noncompliant 03/28/2018  . Tobacco dependence 03/28/2018  . Gastroesophageal reflux disease without esophagitis 03/28/2018  . Hyperlipidemia with target LDL less than 100 03/28/2018  . Dental cavities 11/23/2017  . Essential hypertension 11/23/2017  . New daily persistent headache 11/23/2017  . Scalp lump 08/25/2013     Current Outpatient Medications on File Prior to Visit  Medication Sig Dispense Refill  . amLODipine (NORVASC) 5 MG tablet TAKE 1.5 TABLETS (7.5 MG TOTAL) BY MOUTH DAILY. 45 tablet 3  . atorvastatin (LIPITOR) 10 MG tablet Take 1 tablet (10 mg total) by mouth daily. 90 tablet 3  . Blood Glucose Monitoring Suppl (TRUE METRIX METER) w/Device KIT Use as instructed to check blood sugar 3 times daily. 1 kit 0  . fenofibrate (TRICOR) 145 MG tablet Take 1 tablet (145 mg total) by mouth daily. 30 tablet 3  . glucose blood test strip Use as instructed to check blood sugar 3 times daily. 100  each 11  . Insulin Glargine (LANTUS SOLOSTAR) 100 UNIT/ML Solostar Pen Inject 32 Units into the skin daily. 15 mL 2  . Insulin Pen Needle 29G X 12MM MISC Use with insulin pen 100 each 0  . meclizine (ANTIVERT) 25 MG tablet Take 1 tablet (25 mg total) by mouth 3 (three) times daily as needed for dizziness. 30 tablet 0  . omeprazole (PRILOSEC) 40 MG capsule Take 1 capsule (40 mg total) by mouth daily. 30 capsule 5  . ondansetron (ZOFRAN) 8 MG tablet Take 1 tablet (8 mg total) by mouth every 8 (eight) hours as needed for nausea or vomiting. 30 tablet 0  . oxymetazoline (AFRIN NASAL SPRAY) 0.05 % nasal spray Place 1 spray into both nostrils 2 (two) times daily as needed for congestion. 30 mL 0  . TRUEplus Lancets 28G MISC Use as instructed to check blood sugar 3 times daily. 100 each 11   No current  facility-administered medications on file prior to visit.    No Known Allergies  Social History   Socioeconomic History  . Marital status: Married    Spouse name: Not on file  . Number of children: Not on file  . Years of education: Not on file  . Highest education level: Not on file  Occupational History  . Not on file  Tobacco Use  . Smoking status: Current Every Day Smoker    Packs/day: 0.50    Types: Cigarettes  . Smokeless tobacco: Never Used  Substance and Sexual Activity  . Alcohol use: Yes    Alcohol/week: 12.0 standard drinks    Types: 12 Cans of beer per week  . Drug use: No  . Sexual activity: Not Currently  Other Topics Concern  . Not on file  Social History Narrative  . Not on file   Social Determinants of Health   Financial Resource Strain:   . Difficulty of Paying Living Expenses: Not on file  Food Insecurity:   . Worried About Charity fundraiser in the Last Year: Not on file  . Ran Out of Food in the Last Year: Not on file  Transportation Needs:   . Lack of Transportation (Medical): Not on file  . Lack of Transportation (Non-Medical): Not on file  Physical Activity:   . Days of Exercise per Week: Not on file  . Minutes of Exercise per Session: Not on file  Stress:   . Feeling of Stress : Not on file  Social Connections:   . Frequency of Communication with Friends and Family: Not on file  . Frequency of Social Gatherings with Friends and Family: Not on file  . Attends Religious Services: Not on file  . Active Member of Clubs or Organizations: Not on file  . Attends Archivist Meetings: Not on file  . Marital Status: Not on file  Intimate Partner Violence:   . Fear of Current or Ex-Partner: Not on file  . Emotionally Abused: Not on file  . Physically Abused: Not on file  . Sexually Abused: Not on file    Family History  Problem Relation Age of Onset  . Stroke Father   . Hypertension Father     Past Surgical History:    Procedure Laterality Date  . DG 3RD DIGIT RIGHT HAND    . DG 4TH DIGIT RIGHT HAND      ROS: Review of Systems Negative except as stated above  PHYSICAL EXAM: BP 128/82   Pulse 85   Ht '5\' 9"'  (  1.753 m)   Wt 249 lb (112.9 kg)   SpO2 98%   BMI 36.77 kg/m   Physical Exam  General appearance - alert, well appearing, and in no distress Mental status - normal mood, behavior, speech, dress, motor activity, and thought processes Neck - supple, no significant adenopathy Chest - clear to auscultation, no wheezes, rales or rhonchi, symmetric air entry Heart - normal rate, regular rhythm, normal S1, S2, no murmurs, rubs, clicks or gallops Extremities - peripheral pulses normal, no pedal edema, no clubbing or cyanosis Diabetic Foot Exam - Simple   Simple Foot Form Visual Inspection Sensation Testing Intact to touch and monofilament testing bilaterally: Yes Pulse Check Posterior Tibialis and Dorsalis pulse intact bilaterally: Yes Comments     CMP Latest Ref Rng & Units 01/14/2019 01/09/2019 08/03/2018  Glucose 65 - 99 mg/dL 339(H) 577(HH) 132(H)  BUN 6 - 24 mg/dL '11 11 11  ' Creatinine 0.76 - 1.27 mg/dL 0.74(L) 1.10 1.46(H)  Sodium 134 - 144 mmol/L 130(L) 127(L) 140  Potassium 3.5 - 5.2 mmol/L 3.8 4.3 3.7  Chloride 96 - 106 mmol/L 95(L) 89(L) 105  CO2 20 - 29 mmol/L 18(L) 20 21(L)  Calcium 8.7 - 10.2 mg/dL 8.5(L) 9.4 9.2  Total Protein 6.0 - 8.5 g/dL 5.9(L) 6.7 6.7  Total Bilirubin 0.0 - 1.2 mg/dL <0.2 0.2 0.7  Alkaline Phos 39 - 117 IU/L 126(H) 165(H) 66  AST 0 - 40 IU/L 11 13 45(H)  ALT 0 - 44 IU/L '11 12 31   ' Lipid Panel     Component Value Date/Time   CHOL 326 (H) 01/14/2019 1205   TRIG 1,557 (HH) 01/14/2019 1205   HDL 21 (L) 01/14/2019 1205   CHOLHDL 15.5 (H) 01/14/2019 1205   CHOLHDL 5.2 08/25/2013 1746   VLDL 52 (H) 08/25/2013 1746   LDLCALC Comment (A) 01/14/2019 1205    CBC    Component Value Date/Time   WBC 7.8 01/14/2019 1205   WBC 18.5 (H) 08/03/2018 0728    RBC 4.14 01/14/2019 1205   RBC 4.77 08/03/2018 0728   HGB 13.2 01/14/2019 1205   HCT 37.9 01/14/2019 1205   PLT 333 01/14/2019 1205   MCV 92 01/14/2019 1205   MCH 31.9 01/14/2019 1205   MCH 31.4 08/03/2018 0728   MCHC 34.8 01/14/2019 1205   MCHC 34.0 08/03/2018 0728   RDW 12.2 01/14/2019 1205   LYMPHSABS 2.3 01/14/2019 1205   MONOABS 1.2 (H) 08/03/2018 0728   EOSABS 0.1 01/14/2019 1205   BASOSABS 0.0 01/14/2019 1205    ASSESSMENT AND PLAN: 1. Type 2 diabetes mellitus without complication, with long-term current use of insulin ([place]) Commended him on getting his blood sugars under control.  He will continue Lantus 32 units daily.  Dietary counseling given.  Encouraged him to increase his exercise.  He has set a goal to walk at least 3 times a week for 30 minutes. - POCT glucose (manual entry) - Ambulatory referral to Ophthalmology - Basic Metabolic Panel  2. Hypertriglyceridemia Continue TriCor and Lipitor.  We will recheck lipid profile today to see whether there has been improvement - Lipid panel  3. Tobacco dependence Advised to quit.  Discussed health risks associated with smoking.  Patient not ready to give a trial of quitting.  Less than 5 minutes spent on counseling  4. Essential hypertension Close to goal.  Continue amlodipine  5. Need for vaccination against Streptococcus pneumoniae Given today    Patient was given the opportunity to ask questions.  Patient  verbalized understanding of the plan and was able to repeat key elements of the plan.   Orders Placed This Encounter  Procedures  . Lipid panel  . Basic Metabolic Panel  . Ambulatory referral to Ophthalmology  . POCT glucose (manual entry)     Requested Prescriptions    No prescriptions requested or ordered in this encounter    Return in about 3 months (around 06/14/2019).  Karle Plumber, MD, FACP

## 2019-03-18 ENCOUNTER — Telehealth: Payer: Self-pay

## 2019-03-18 ENCOUNTER — Other Ambulatory Visit: Payer: Self-pay | Admitting: Internal Medicine

## 2019-03-18 DIAGNOSIS — E785 Hyperlipidemia, unspecified: Secondary | ICD-10-CM

## 2019-03-18 LAB — LIPID PANEL
Chol/HDL Ratio: 5.1 ratio — ABNORMAL HIGH (ref 0.0–5.0)
Cholesterol, Total: 208 mg/dL — ABNORMAL HIGH (ref 100–199)
HDL: 41 mg/dL (ref >39)
LDL Chol Calc (NIH): 144 mg/dL — ABNORMAL HIGH (ref 0–99)
Triglycerides: 127 mg/dL (ref 0–149)
VLDL Cholesterol Cal: 23 mg/dL (ref 5–40)

## 2019-03-18 LAB — BASIC METABOLIC PANEL
BUN/Creatinine Ratio: 9 (ref 9–20)
BUN: 10 mg/dL (ref 6–24)
CO2: 25 mmol/L (ref 20–29)
Calcium: 10 mg/dL (ref 8.7–10.2)
Chloride: 102 mmol/L (ref 96–106)
Creatinine, Ser: 1.11 mg/dL (ref 0.76–1.27)
GFR calc Af Amer: 91 mL/min/{1.73_m2} (ref >59)
GFR calc non Af Amer: 79 mL/min/{1.73_m2} (ref >59)
Glucose: 126 mg/dL — ABNORMAL HIGH (ref 65–99)
Potassium: 4.7 mmol/L (ref 3.5–5.2)
Sodium: 140 mmol/L (ref 134–144)

## 2019-03-18 MED ORDER — ATORVASTATIN CALCIUM 20 MG PO TABS: 20.0000 mg | ORAL_TABLET | Freq: Every day | ORAL | 3 refills | Status: DC

## 2019-03-18 MED FILL — ?ATORVASTATIN 20 MG TABLET: 20 | 30 days supply | Qty: 30 | Fill #0

## 2019-03-18 NOTE — Telephone Encounter (Signed)
Contacted pt to go over lab results pt is aware and doesn't have any questions or concerns 

## 2019-03-24 MED FILL — AMLODIPINE BESYLATE 5 MG TA: 5 | 30 days supply | Qty: 45 | Fill #1

## 2019-03-24 MED FILL — OMEPRAZOLE DR 40 MG CAPSULE: 40 | 30 days supply | Qty: 30 | Fill #5

## 2019-03-26 MED FILL — FENOFIBRATE 145 MG TABLET: 145 | 30 days supply | Qty: 30 | Fill #2

## 2019-04-24 MED FILL — ?BASAGLAR 100 UNITS/ML KWPE: 100 | 28 days supply | Qty: 9 | Fill #2

## 2019-04-28 ENCOUNTER — Other Ambulatory Visit: Payer: Self-pay | Admitting: Internal Medicine

## 2019-04-28 DIAGNOSIS — K219 Gastro-esophageal reflux disease without esophagitis: Secondary | ICD-10-CM

## 2019-04-28 MED FILL — ?ATORVASTATIN 20 MG TABLET: 20 | 30 days supply | Qty: 30 | Fill #1

## 2019-04-28 MED FILL — AMLODIPINE BESYLATE 5 MG TA: 5 | 30 days supply | Qty: 45 | Fill #2

## 2019-04-29 ENCOUNTER — Other Ambulatory Visit: Payer: Self-pay | Admitting: Internal Medicine

## 2019-04-29 MED FILL — OMEPRAZOLE DR 40 MG CAPSULE: 40 | 30 days supply | Qty: 30 | Fill #0

## 2019-05-09 MED FILL — ?FENOFIBRATE 145 MG TABLET: 145 | 30 days supply | Qty: 30 | Fill #3

## 2019-05-29 ENCOUNTER — Other Ambulatory Visit: Payer: Self-pay | Admitting: Pharmacist

## 2019-05-29 DIAGNOSIS — E785 Hyperlipidemia, unspecified: Secondary | ICD-10-CM

## 2019-05-29 DIAGNOSIS — I10 Essential (primary) hypertension: Secondary | ICD-10-CM

## 2019-05-29 DIAGNOSIS — R110 Nausea: Secondary | ICD-10-CM

## 2019-05-29 MED ORDER — ONDANSETRON HCL 8 MG PO TABS: 8.0000 mg | ORAL_TABLET | Freq: Three times a day (TID) | ORAL | 0 refills | Status: DC | PRN

## 2019-05-29 MED ORDER — FENOFIBRATE 145 MG PO TABS: 145.0000 mg | ORAL_TABLET | Freq: Every day | ORAL | 3 refills | Status: DC

## 2019-05-29 MED ORDER — MECLIZINE HCL 25 MG PO TABS: 25.0000 mg | ORAL_TABLET | Freq: Three times a day (TID) | ORAL | 2 refills | Status: DC | PRN

## 2019-05-29 MED FILL — ?ONDANSETON HCL 8MG TAB: 8 | 10 days supply | Qty: 30 | Fill #0

## 2019-05-29 MED FILL — TRUE METRIX GLUCOSE TEST ST: 33 days supply | Qty: 100 | Fill #1

## 2019-05-29 MED FILL — MECLIZINE 25 MG TABLET: 25 | 10 days supply | Qty: 30 | Fill #0

## 2019-05-29 MED FILL — AMLODIPINE BESYLATE 5 MG TA: 5 | 30 days supply | Qty: 45 | Fill #3

## 2019-05-29 MED FILL — TRUEplus LANCETS 28G MISC: 33 days supply | Qty: 100 | Fill #1

## 2019-05-29 MED FILL — ?ATORVASTATIN 20 MG TABLET: 20 | 30 days supply | Qty: 30 | Fill #2

## 2019-05-29 MED FILL — OMEPRAZOLE DR 40 MG CAPSULE: 40 | 30 days supply | Qty: 30 | Fill #1

## 2019-06-04 MED FILL — ?BASAGLAR 100 UNITS/ML KWPE: 100 | 28 days supply | Qty: 9 | Fill #3

## 2019-07-01 MED FILL — OMEPRAZOLE DR 40 MG CAPSULE: 40 | 30 days supply | Qty: 30 | Fill #2

## 2019-07-01 MED FILL — ?ATORVASTATIN 20 MG TABLET: 20 | 30 days supply | Qty: 30 | Fill #3

## 2019-07-02 ENCOUNTER — Other Ambulatory Visit: Payer: Self-pay | Admitting: Internal Medicine

## 2019-07-02 DIAGNOSIS — I10 Essential (primary) hypertension: Secondary | ICD-10-CM

## 2019-07-02 MED FILL — ?BASAGLAR 100 UNITS/ML KWPE: 100 | 28 days supply | Qty: 9 | Fill #4

## 2019-07-03 MED FILL — ?AMLODIPINE BESYLATE 5MG TA: 5 | 30 days supply | Qty: 45 | Fill #0

## 2019-07-14 MED FILL — ?FENOFIBRATE 145 MG TABLET: 145 | 30 days supply | Qty: 30 | Fill #1

## 2019-08-06 ENCOUNTER — Other Ambulatory Visit: Payer: Self-pay | Admitting: Internal Medicine

## 2019-08-06 MED FILL — OMEPRAZOLE DR 40 MG CAPSULE: 40 | 30 days supply | Qty: 30 | Fill #3

## 2019-08-06 MED FILL — ?AMLODIPINE BESYLATE 5MG TA: 5 | 30 days supply | Qty: 45 | Fill #1

## 2019-08-06 MED FILL — ?ATORVASTATIN 20 MG TABLET: 20 | 30 days supply | Qty: 30 | Fill #4

## 2019-08-06 NOTE — Telephone Encounter (Signed)
Patient does not have listed medication on med list. Please follow up with patient

## 2019-08-06 NOTE — Telephone Encounter (Signed)
Will forward to pcp

## 2019-08-06 NOTE — Telephone Encounter (Signed)
   Notes to clinic:  not on current medication list  Review for refill   Requested Prescriptions  Pending Prescriptions Disp Refills   Insulin Glargine (BASAGLAR KWIKPEN) 100 UNIT/ML [Pharmacy Med Name: BASAGLAR 100 UNITS/ML KWPE 100 Solution Pen-injector] 9 mL 2    Sig: INJECT 32 UNITS INTO THE SKIN DAILY.      Endocrinology:  Diabetes - Insulins Failed - 08/06/2019 12:32 PM      Failed - HBA1C is between 0 and 7.9 and within 180 days    Hgb A1c MFr Bld  Date Value Ref Range Status  01/09/2019 >15.5 (H) 4.8 - 5.6 % Final    Comment:    **Verified by repeat analysis**          Prediabetes: 5.7 - 6.4          Diabetes: >6.4          Glycemic control for adults with diabetes: <7.0           Passed - Valid encounter within last 6 months    Recent Outpatient Visits           4 months ago Type 2 diabetes mellitus without complication, with long-term current use of insulin ([place])   [place], Neoma Laming B, MD   5 months ago Uncontrolled type 2 diabetes mellitus with hyperglycemia [facility])   [place], Annie Main L, RPH-CPP   6 months ago Uncontrolled type 2 diabetes mellitus with hyperglycemia [facility])   [place], Annie Main L, RPH-CPP   6 months ago Uncontrolled type 2 diabetes mellitus with hyperglycemia [facility])   [place], Jarome Matin, RPH-CPP   6 months ago Medication management   [place], RPH-CPP

## 2019-08-07 ENCOUNTER — Other Ambulatory Visit: Payer: Self-pay | Admitting: Internal Medicine

## 2019-08-07 MED FILL — ?FENOFIBRATE 145 MG TABLET: 145 | 30 days supply | Qty: 30 | Fill #2

## 2019-08-08 MED FILL — ?BASAGLAR 100 UNITS/ML KWPE: 100 | 28 days supply | Qty: 9 | Fill #0

## 2019-09-08 ENCOUNTER — Other Ambulatory Visit: Payer: Self-pay | Admitting: Internal Medicine

## 2019-09-08 ENCOUNTER — Other Ambulatory Visit: Payer: Self-pay | Admitting: Critical Care Medicine

## 2019-09-08 MED ORDER — INSULIN PEN NEEDLE 29G X 12MM MISC: 0 refills | Status: DC

## 2019-09-08 MED FILL — ?BASAGLAR 100 UNITS/ML KWPE: 100 | 28 days supply | Qty: 9 | Fill #1

## 2019-09-08 NOTE — Telephone Encounter (Signed)
Medication Refill - Medication: Insulin Pen Needle 29G X 12MM MISC Insulin Glargine (BASAGLAR KWIKPEN) 100 UNIT/ML  Patient states he is all out of his insulin   Preferred Pharmacy (with phone number or street name):  [place], [place] [address] Phone:  [phone]  Fax:  [phone]       Agent: Please be advised that RX refills may take up to 3 business days. We ask that you follow-up with your pharmacy.

## 2019-09-10 MED FILL — OMEPRAZOLE DR 40 MG CAPSULE: 40 | 30 days supply | Qty: 30 | Fill #4

## 2019-09-10 MED FILL — ?AMLODIPINE BESYLATE 5MG TA: 5 | 30 days supply | Qty: 45 | Fill #2

## 2019-09-10 MED FILL — ?ATORVASTATIN 20 MG TABLET: 20 | 30 days supply | Qty: 30 | Fill #5

## 2019-09-10 MED FILL — ?FENOFIBRATE 145 MG TABLET: 145 | 30 days supply | Qty: 30 | Fill #3

## 2019-10-09 ENCOUNTER — Encounter: Payer: Self-pay | Admitting: Plastic Surgery

## 2019-10-09 ENCOUNTER — Other Ambulatory Visit: Payer: Self-pay

## 2019-10-09 ENCOUNTER — Ambulatory Visit (INDEPENDENT_AMBULATORY_CARE_PROVIDER_SITE_OTHER): Payer: Self-pay | Admitting: Plastic Surgery

## 2019-10-09 VITALS — BP 157/98 | HR 79 | Temp 98.1°F | Ht 71.0 in | Wt 260.0 lb

## 2019-10-09 DIAGNOSIS — D170 Benign lipomatous neoplasm of skin and subcutaneous tissue of head, face and neck: Secondary | ICD-10-CM

## 2019-10-09 NOTE — Progress Notes (Signed)
   Referring Provider Ladell Pier, MD [place],  [place] [id number]   CC:  Chief Complaint  Patient presents with  . Advice Only      [name] is an 49 y.o. male.  HPI: Patient presents with a cystic lesion on the right forehead.  Is been present for a number of years.  It started off small and is gradually gotten larger.  It bothers him and he would like to have it removed.  No Known Allergies  Outpatient Encounter Medications as of 10/09/2019  Medication Sig  . amLODipine (NORVASC) 5 MG tablet TAKE 1.5 TABLETS (7.5 MG TOTAL) BY MOUTH DAILY.  Marland Kitchen atorvastatin (LIPITOR) 20 MG tablet Take 1 tablet (20 mg total) by mouth daily.  . Blood Glucose Monitoring Suppl (TRUE METRIX METER) w/Device KIT Use as instructed to check blood sugar 3 times daily.  . fenofibrate (TRICOR) 145 MG tablet Take 1 tablet (145 mg total) by mouth daily.  Marland Kitchen glucose blood test strip Use as instructed to check blood sugar 3 times daily.  . Insulin Glargine (BASAGLAR KWIKPEN) 100 UNIT/ML INJECT 32 UNITS INTO THE SKIN DAILY.  Marland Kitchen Insulin Pen Needle 29G X 12MM MISC Use with insulin pen  . meclizine (ANTIVERT) 25 MG tablet Take 1 tablet (25 mg total) by mouth 3 (three) times daily as needed for dizziness.  Marland Kitchen omeprazole (PRILOSEC) 40 MG capsule TAKE 1 CAPSULE (40 MG TOTAL) BY MOUTH DAILY.  Marland Kitchen oxymetazoline (AFRIN NASAL SPRAY) 0.05 % nasal spray Place 1 spray into both nostrils 2 (two) times daily as needed for congestion.  . TRUEplus Lancets 28G MISC Use as instructed to check blood sugar 3 times daily.  . ondansetron (ZOFRAN) 8 MG tablet Take 1 tablet (8 mg total) by mouth every 8 (eight) hours as needed for nausea or vomiting. (Patient not taking: Reported on 10/09/2019)   No facility-administered encounter medications on file as of 10/09/2019.     Past Medical History:  Diagnosis Date  . Renal disorder     Past Surgical History:  Procedure Laterality Date  . DG 3RD DIGIT RIGHT HAND    . DG 4TH  DIGIT RIGHT HAND      Family History  Problem Relation Age of Onset  . Stroke Father   . Hypertension Father     Social History   Social History Narrative  . Not on file     Review of Systems General: Denies fevers, chills, weight loss CV: Denies chest pain, shortness of breath, palpitations  Physical Exam Vitals with BMI 10/09/2019 03/17/2019 03/17/2019  Height $Remov'5\' 11"'Dmayzd$  - $'5\' 9"'M$   Weight 260 lbs - 249 lbs  BMI 46.80 - 32.12  Systolic 248 250 037  Diastolic 98 82 91  Pulse 79 - 85    General:  No acute distress,  Alert and oriented, Non-Toxic, Normal speech and affect Examination shows a 3 cm subcutaneous mass in the right forehead near the hairline.  It is freely mobile and the overlying skin appears normal.  Assessment/Plan Patient presents with a subcutaneous mass in the right forehead.  We discussed excision under local.  We discussed the risks include bleeding, infection, damage to surrounding structures need for additional procedures.  I discussed the location and orientation of the scar.  We will plan to move ahead and plan to excise this in the office.  All of his questions were answered.  Cindra Presume 10/09/2019, 10:25 AM

## 2019-10-10 ENCOUNTER — Other Ambulatory Visit: Payer: Self-pay | Admitting: Internal Medicine

## 2019-10-10 DIAGNOSIS — K219 Gastro-esophageal reflux disease without esophagitis: Secondary | ICD-10-CM

## 2019-10-10 DIAGNOSIS — I10 Essential (primary) hypertension: Secondary | ICD-10-CM

## 2019-10-10 DIAGNOSIS — E785 Hyperlipidemia, unspecified: Secondary | ICD-10-CM

## 2019-10-10 MED ORDER — FENOFIBRATE 145 MG PO TABS: 145.0000 mg | ORAL_TABLET | Freq: Every day | ORAL | 2 refills | Status: DC

## 2019-10-10 MED ORDER — ATORVASTATIN CALCIUM 20 MG PO TABS: 20.0000 mg | ORAL_TABLET | Freq: Every day | ORAL | 0 refills | Status: DC

## 2019-10-10 MED ORDER — OMEPRAZOLE 40 MG PO CPDR: 40.0000 mg | DELAYED_RELEASE_CAPSULE | Freq: Every day | ORAL | 2 refills | Status: DC

## 2019-10-10 MED ORDER — BASAGLAR KWIKPEN 100 UNIT/ML ~~LOC~~ SOPN: PEN_INJECTOR | SUBCUTANEOUS | 2 refills | Status: DC

## 2019-10-10 MED ORDER — AMLODIPINE BESYLATE 5 MG PO TABS: 7.5000 mg | ORAL_TABLET | Freq: Every day | ORAL | 2 refills | Status: DC

## 2019-10-10 MED ORDER — INSULIN PEN NEEDLE 29G X 12MM MISC: 0 refills | Status: DC

## 2019-10-10 MED FILL — ?FENOFIBRATE 145 MG TABLET: 145 | 30 days supply | Qty: 30 | Fill #0

## 2019-10-10 MED FILL — OMEPRAZOLE DR 40 MG CAPSULE: 40 | 30 days supply | Qty: 30 | Fill #0

## 2019-10-10 MED FILL — AMLODIPINE BESYLATE 5 MG TA: 5 | 30 days supply | Qty: 45 | Fill #0

## 2019-10-10 MED FILL — ?ATORVASTATIN 20 MG TABLET: 20 | 30 days supply | Qty: 30 | Fill #0

## 2019-10-10 MED FILL — ?BASAGLAR 100 UNITS/ML KWPE: 100 | 28 days supply | Qty: 9 | Fill #0

## 2019-10-10 MED FILL — TRUEPLUS PEN NDL 31GX5/16: 31G X 8 MM | 90 days supply | Qty: 100 | Fill #0

## 2019-10-10 NOTE — Telephone Encounter (Signed)
appt made for 12/04/19. Patient reports he is out of all medications.

## 2019-10-10 NOTE — Telephone Encounter (Signed)
Medication: Insulin Glargine [facility] KWIKPEN) 100 UNIT/ML [[id number]] , Insulin Pen Needle 29G X 12MM MISC [[id number]], omeprazole (PRILOSEC) 40 MG capsule [[id number]] , amLODipine (NORVASC) 5 MG tablet [[id number]] , atorvastatin (LIPITOR) 20 MG tablet [[id number]] , fenofibrate (TRICOR) 145 MG tablet [[id number],  Has the patient contacted their pharmacy? YES (Agent: If no, request that the patient contact the pharmacy for the refill.) (Agent: If yes, when and what did the pharmacy advise?)  Preferred Pharmacy (with phone number or street name): [place], [place] [address]  Phone:  [phone] Fax:  [phone]     Agent: Please be advised that RX refills may take up to 3 business days. We ask that you follow-up with your pharmacy.

## 2019-11-07 ENCOUNTER — Emergency Department (HOSPITAL_COMMUNITY)
Admission: EM | Admit: 2019-11-07 | Discharge: 2019-11-07 | Disposition: A | Discharge status: Home / Self Care | Payer: Medicaid Other | Attending: Emergency Medicine | Admitting: Emergency Medicine

## 2019-11-07 DIAGNOSIS — Z79899 Other long term (current) drug therapy: Secondary | ICD-10-CM | POA: Insufficient documentation

## 2019-11-07 DIAGNOSIS — F1020 Alcohol dependence, uncomplicated: Secondary | ICD-10-CM | POA: Insufficient documentation

## 2019-11-07 DIAGNOSIS — E1110 Type 2 diabetes mellitus with ketoacidosis without coma: Secondary | ICD-10-CM | POA: Insufficient documentation

## 2019-11-07 DIAGNOSIS — Y907 Blood alcohol level of 200-239 mg/100 ml: Secondary | ICD-10-CM | POA: Insufficient documentation

## 2019-11-07 DIAGNOSIS — I10 Essential (primary) hypertension: Secondary | ICD-10-CM | POA: Insufficient documentation

## 2019-11-07 DIAGNOSIS — E1165 Type 2 diabetes mellitus with hyperglycemia: Secondary | ICD-10-CM | POA: Insufficient documentation

## 2019-11-07 DIAGNOSIS — F10920 Alcohol use, unspecified with intoxication, uncomplicated: Secondary | ICD-10-CM

## 2019-11-07 DIAGNOSIS — F17210 Nicotine dependence, cigarettes, uncomplicated: Secondary | ICD-10-CM | POA: Insufficient documentation

## 2019-11-07 DIAGNOSIS — Z794 Long term (current) use of insulin: Secondary | ICD-10-CM | POA: Insufficient documentation

## 2019-11-07 LAB — CBC WITH DIFFERENTIAL/PLATELET
Abs Immature Granulocytes: 0.04 10*3/uL (ref 0.00–0.07)
Basophils Absolute: 0.1 10*3/uL (ref 0.0–0.1)
Basophils Relative: 1 %
Eosinophils Absolute: 0.2 10*3/uL (ref 0.0–0.5)
Eosinophils Relative: 2 %
HCT: 41.3 % (ref 39.0–52.0)
Hemoglobin: 13.5 g/dL (ref 13.0–17.0)
Immature Granulocytes: 0 %
Lymphocytes Relative: 31 %
Lymphs Abs: 2.8 10*3/uL (ref 0.7–4.0)
MCH: 30.6 pg (ref 26.0–34.0)
MCHC: 32.7 g/dL (ref 30.0–36.0)
MCV: 93.7 fL (ref 80.0–100.0)
Monocytes Absolute: 0.6 10*3/uL (ref 0.1–1.0)
Monocytes Relative: 6 %
Neutro Abs: 5.5 10*3/uL (ref 1.7–7.7)
Neutrophils Relative %: 60 %
Platelets: 435 10*3/uL — ABNORMAL HIGH (ref 150–400)
RBC: 4.41 MIL/uL (ref 4.22–5.81)
RDW: 12.9 % (ref 11.5–15.5)
WBC: 9.2 10*3/uL (ref 4.0–10.5)
nRBC: 0 % (ref 0.0–0.2)

## 2019-11-07 LAB — BASIC METABOLIC PANEL
Anion gap: 13 (ref 5–15)
BUN: 8 mg/dL (ref 6–20)
CO2: 20 mmol/L — ABNORMAL LOW (ref 22–32)
Calcium: 9 mg/dL (ref 8.9–10.3)
Chloride: 105 mmol/L (ref 98–111)
Creatinine, Ser: 1.09 mg/dL (ref 0.61–1.24)
GFR, Estimated: >60 mL/min (ref >60)
Glucose, Bld: 188 mg/dL — ABNORMAL HIGH (ref 70–99)
Potassium: 3.9 mmol/L (ref 3.5–5.1)
Sodium: 138 mmol/L (ref 135–145)

## 2019-11-07 LAB — RAPID URINE DRUG SCREEN, HOSP PERFORMED
Amphetamines: NOT DETECTED
Barbiturates: NOT DETECTED
Benzodiazepines: NOT DETECTED
Cocaine: POSITIVE — AB
Opiates: NOT DETECTED
Tetrahydrocannabinol: POSITIVE — AB

## 2019-11-07 LAB — SALICYLATE LEVEL: Salicylate Lvl: <7 mg/dL — ABNORMAL LOW (ref 7.0–30.0)

## 2019-11-07 LAB — ETHANOL: Alcohol, Ethyl (B): 225 mg/dL — ABNORMAL HIGH (ref <10)

## 2019-11-07 LAB — ACETAMINOPHEN LEVEL: Acetaminophen (Tylenol), Serum: <10 ug/mL — ABNORMAL LOW (ref 10–30)

## 2019-11-07 MED ORDER — SODIUM CHLORIDE 0.9 % IV BOLUS
1000.0000 mL | Freq: Once | INTRAVENOUS | Status: AC
  Administered 2019-11-07: 1000 mL via INTRAVENOUS

## 2019-11-07 NOTE — ED Notes (Addendum)
This RN notes pt to be dress and IV self removed. Pt able to walk, would like to leave, is A&Ox4. Refuses for RN to call wife. PO challenged and successful. Pt states he will be walking home. Pt adamantly would like to leave refuses vitals.

## 2019-11-07 NOTE — ED Provider Notes (Signed)
[facility] EMERGENCY DEPARTMENT Provider Note   CSN: [id number] Arrival date & time: 11/07/19  1126     History Alcohol intoxication   [name] is a 49 y.o. 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  EMS was called out for possible unresponsive. EMS states patient was not unresponsive when they got there.  Patient states he has been drinking since the middle of the night.  Admits to drinking 1/5 of liquor as well as 240 ounce beers.  Patient states he fell asleep on the sidewalk on the side of a road because he is unable to walk due to being too intoxicated.  Patient denies any intent at self-harm however did tell EMS that he thought he was going to die due to "too much alcohol."  Denies any prior history of DTs or withdrawal seizures.  Denies SI, HI, AVH.  Patient does have some mild slurred speech, appears confused.  Appears consistent with intoxication. He denies any pain, falls, or trauma.  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 ([place]) 01/14/2019   Hyponatremia 01/14/2019   Uncontrolled type 2 diabetes mellitus with hyperglycemia ([place]) 01/14/2019   Medically noncompliant 03/28/2018   Tobacco dependence 03/28/2018   Gastroesophageal reflux disease without esophagitis 03/28/2018   Hyperlipidemia with target LDL less than 100 03/28/2018   Dental cavities 11/23/2017   Essential hypertension 11/23/2017   New daily persistent headache 11/23/2017   Scalp lump  08/25/2013    Past Surgical History:  Procedure Laterality Date   DG 3RD DIGIT RIGHT HAND     DG 4TH DIGIT RIGHT HAND         Family History  Problem Relation Age of Onset   Stroke Father    Hypertension Father     Social History   Tobacco Use   Smoking status: Current Every Day Smoker    Packs/day: 0.50    Types: Cigarettes   Smokeless tobacco: Never Used  Vaping Use   Vaping Use: Never used  Substance Use Topics   Alcohol use: Yes    Alcohol/week: 12.0 standard drinks    Types: 12 Cans of beer per week   Drug use: No    Home Medications Prior to Admission medications   Medication Sig Start Date End Date Taking? Authorizing Provider  amLODipine (NORVASC) 5 MG tablet Take 1.5 tablets (7.5 mg total) by mouth daily. 10/10/19   Ladell Pier, MD  atorvastatin (LIPITOR) 20 MG tablet Take 1 tablet (20 mg total) by mouth daily. 10/10/19   Ladell Pier, MD  Blood Glucose Monitoring Suppl (TRUE METRIX METER) w/Device KIT Use as instructed to check blood sugar 3 times daily. 01/14/19   Ladell Pier, MD  fenofibrate (TRICOR) 145 MG tablet Take 1 tablet (145 mg total) by mouth daily. 10/10/19   Ladell Pier, MD  glucose blood test strip Use as instructed to check blood sugar 3 times daily. 01/14/19   Ladell Pier, MD  Insulin Glargine (BASAGLAR KWIKPEN) 100 UNIT/ML INJECT 32 UNITS INTO THE  SKIN DAILY. 10/10/19   Ladell Pier, MD  Insulin Pen Needle 29G X 12MM MISC Use with insulin pen 10/10/19   Ladell Pier, MD  meclizine (ANTIVERT) 25 MG tablet Take 1 tablet (25 mg total) by mouth 3 (three) times daily as needed for dizziness. 05/29/19   Ladell Pier, MD  omeprazole (PRILOSEC) 40 MG capsule Take 1 capsule (40 mg total) by mouth daily. 10/10/19   Ladell Pier, MD  ondansetron (ZOFRAN) 8 MG tablet Take 1 tablet (8 mg total) by mouth every 8 (eight) hours as needed for nausea or vomiting. Patient not taking: Reported on 10/09/2019  05/29/19   Ladell Pier, MD  oxymetazoline [facility] NASAL SPRAY) 0.05 % nasal spray Place 1 spray into both nostrils 2 (two) times daily as needed for congestion. 08/03/18   McDonald, Mia A, PA-C  TRUEplus Lancets 28G MISC Use as instructed to check blood sugar 3 times daily. 01/14/19   Ladell Pier, MD   Allergies    Patient has no known allergies.  Review of Systems   Review of Systems  Constitutional: Negative.   HENT: Negative.   Respiratory: Negative.   Cardiovascular: Negative.   Gastrointestinal: Negative.   Genitourinary: Negative.   Musculoskeletal: Negative.   Skin: Negative.   Neurological: Negative.   Psychiatric/Behavioral: Positive for confusion (Intoxicated).  All other systems reviewed and are negative.   Physical Exam Updated Vital Signs BP 102/61    Pulse 82    Temp 98.5 F (36.9 C) (Oral)    Resp 19    SpO2 97%   Physical Exam Vitals and nursing note reviewed.  Constitutional:      General: He is not in acute distress.    Appearance: He is well-developed. He is not ill-appearing, toxic-appearing or diaphoretic.  HENT:     Head: Normocephalic and atraumatic.     Nose: Nose normal.     Mouth/Throat:     Mouth: Mucous membranes are moist.  Eyes:     Pupils: Pupils are equal, round, and reactive to light.  Cardiovascular:     Rate and Rhythm: Normal rate and regular rhythm.     Pulses: Normal pulses.     Heart sounds: Normal heart sounds.  Pulmonary:     Effort: Pulmonary effort is normal. No respiratory distress.     Breath sounds: Normal breath sounds.     Comments: Clear to auscultation bilaterally Abdominal:     General: Bowel sounds are normal. There is no distension.     Palpations: Abdomen is soft.     Comments: Soft, nontender  Musculoskeletal:        General: Normal range of motion.     Cervical back: Normal range of motion and neck supple.     Comments: Moves all 4 extremities without difficulty.  No bony tenderness.  Compartments  soft  Skin:    General: Skin is warm and dry.     Capillary Refill: Capillary refill takes less than 2 seconds.  Neurological:     Mental Status: He is alert.     Comments: Difficult exam due to intoxication.  Cranial nerves II through XII grossly intact.  Follows commands however falls asleep mid conversation.  Moves bilateral upper and lower extremities without difficulty.  Mild slurred speech consistent with intoxication.  Able to ambulate due to AMS.  Alert to person, [facility]  Time initially says 2002 however corrects to 2021.  When goes to say month states December  and then corrects to October  Psychiatric:     Comments: Denies SI, HI, AVH however states he thought "I am going to die from drinking."     ED Results / Procedures / Treatments   Labs (all labs ordered are listed, but only abnormal results are displayed) Labs Reviewed  ETHANOL - Abnormal; Notable for the following components:      Result Value   Alcohol, Ethyl (B) 225 (*)    All other components within normal limits  RAPID URINE DRUG SCREEN, HOSP PERFORMED - Abnormal; Notable for the following components:   Cocaine POSITIVE (*)    Tetrahydrocannabinol POSITIVE (*)    All other components within normal limits  CBC WITH DIFFERENTIAL/PLATELET - Abnormal; Notable for the following components:   Platelets 435 (*)    All other components within normal limits  SALICYLATE LEVEL - Abnormal; Notable for the following components:   Salicylate Lvl <9.3 (*)    All other components within normal limits  ACETAMINOPHEN LEVEL - Abnormal; Notable for the following components:   Acetaminophen (Tylenol), Serum <10 (*)    All other components within normal limits  BASIC METABOLIC PANEL - Abnormal; Notable for the following components:   CO2 20 (*)    Glucose, Bld 188 (*)    All other components within normal limits    EKG None  Radiology No results found.  Procedures Procedures (including critical care  time)  Medications Ordered in ED Medications  sodium chloride 0.9 % bolus 1,000 mL (0 mLs Intravenous Stopped 11/07/19 1300)    ED Course  I have reviewed the triage vital signs and the nursing notes.  Pertinent labs & imaging results that were available during my care of the patient were reviewed by me and considered in my medical decision making (see chart for details).  49 year old male presents for evaluation of alcohol intoxication.  None by EMS leading on side of the road.  He has no evidence of traumatic injuries.  Does appear confused with some mild slurred speech however he also does smell like alcohol.  Admits to drinking 1/5 of liquor as well as 240s over the evening.  Neuro exam grossly intact.  He does fall asleep mid conversation.  Apparently told police possible SI however patient states "I thought I was going to die because I drink so much."  He is unsure if he has prior history of DTs or withdrawal seizures.  We will plan on labs, IV fluids, observe.  Will reassess once clinically more sober.  Low suspicion for acute traumatic injury, has stable vital signs have low suspicion for sepsis or acute bacterial illness.  Although he does have slurred speech I feel this is more intoxication with suspicion for acute intracranial abnormality.  Labs and imaging personally reviewed and interpreted:  CBC without leukocytosis Metabolic panel with mild hyperglycemia at 188 however noticed electrolyte, renal abnormality Ethanol 225 likely some component of chronic elevation due to his chronic alcohol use UDS positive for cocaine and THC  Patient reassessed.  Watching TV.  Has removed his IV and has gotten himself dressed.  Denies SI, HI, AVH.  Patient reassessed.  He ambulated to the restroom and back to his room without any difficulty.  He is tolerating p.o. intake.  Has continued to deny any SI, AVH or HI throughout his ED stay.  Patient requesting DC home.  States he does not want any help  for his alcohol use.  Discussed possible resources outpatient however states "I do  not want that."  Patient appears to have clinically sobered.  His UDS was positive for cocaine as well which I feel is likely contributing to his altered mental status on arrival along with his chronic alcohol use.  Does not appear to be in any withdrawal at this time.  Throughtout his stay he has been denying to myself and nursing multiple times any intent at SI, HI, AVH.  Do not feel patient meets criteria for IVC.  He appears clinically stable.  Will DC home.  The patient has been appropriately medically screened and/or stabilized in the ED. I have low suspicion for any other emergent medical condition which would require further screening, evaluation or treatment in the ED or require inpatient management.  Patient is hemodynamically stable and in no acute distress.  Patient able to ambulate in department prior to ED.  Evaluation does not show acute pathology that would require ongoing or additional emergent interventions while in the emergency department or further inpatient treatment.  I have discussed the diagnosis with the patient and answered all questions.  Pain is been managed while in the emergency department and patient has no further complaints prior to discharge.  Patient is comfortable with plan discussed in room and is stable for discharge at this time.  I have discussed strict return precautions for returning to the emergency department.  Patient was encouraged to follow-up with PCP/specialist refer to at discharge.    MDM Rules/Calculators/A&P                           Final Clinical Impression(s) / ED Diagnoses Final diagnoses:  Alcoholic intoxication without complication [facility])    Rx / DC Orders ED Discharge Orders    None       Rhylie Stehr A, PA-C 11/07/19 1500    Sherwood Gambler, MD 11/11/19 [phone]

## 2019-11-07 NOTE — ED Triage Notes (Addendum)
EMS called for unresponsive, arrived to pt talking on side of road, states he hurts all over and "wants to die." In ED states "I wasn't trying to hurt myself but I was asking God to die. He said no, so I'm here. I'm ready, but I'm frustrated." Denies drinking daily, drank 2 40 oz beers and a fifth between last night and this AM. H/o DM, 204mg /dL with EMS. [id number], 99%RA. No known psych hx. Currently denies SI

## 2019-11-07 NOTE — ED Notes (Signed)
Floor wet upon rounding, empty urinal at bedside. Ensured fluid not from IV bolus, assumed to be urine. Pt states he does not remember doing this, reoriented to urinal use/location and call light use/location. Pt is cooperative during this. Gown changed

## 2019-11-07 NOTE — ED Notes (Signed)
Spoke with pt wife, she states pt has been having "more of a problem lately" with alcohol, has been diagnosed recently (within past 2 mo) with pancreatitis and DM

## 2019-11-12 ENCOUNTER — Other Ambulatory Visit: Payer: Self-pay | Admitting: Internal Medicine

## 2019-11-12 MED ORDER — BASAGLAR KWIKPEN 100 UNIT/ML ~~LOC~~ SOPN: PEN_INJECTOR | SUBCUTANEOUS | 0 refills | Status: DC

## 2019-11-12 MED FILL — ?BASAGLAR 100 UNITS/ML KWPE: 100 | 28 days supply | Qty: 9 | Fill #0

## 2019-11-12 NOTE — Telephone Encounter (Signed)
Medication Refill - Medication: insulin   Has the patient contacted their pharmacy? Yes.   (Agent: If no, request that the patient contact the pharmacy for the refill.) (Agent: If yes, when and what did the pharmacy advise?)  Preferred Pharmacy (with phone number or street name):  [facility], [address]  [place] [address]  Phone: [phone] Fax: [phone]  Hours: Not open 24 hours     Agent: Please be advised that RX refills may take up to 3 business days. We ask that you follow-up with your pharmacy.

## 2019-11-14 MED FILL — ?FENOFIBRATE 145 MG TABLET: 145 | 30 days supply | Qty: 30 | Fill #1

## 2019-11-14 MED FILL — AMLODIPINE BESYLATE 5 MG TA: 5 | 30 days supply | Qty: 45 | Fill #1

## 2019-11-14 MED FILL — OMEPRAZOLE DR 40 MG CAPSULE: 40 | 30 days supply | Qty: 30 | Fill #1

## 2019-11-14 MED FILL — ?ATORVASTATIN 20 MG TABLET: 20 | 30 days supply | Qty: 30 | Fill #1

## 2019-11-19 ENCOUNTER — Ambulatory Visit (INDEPENDENT_AMBULATORY_CARE_PROVIDER_SITE_OTHER): Payer: Self-pay | Admitting: Plastic Surgery

## 2019-11-19 ENCOUNTER — Other Ambulatory Visit (HOSPITAL_COMMUNITY)
Admission: RE | Admit: 2019-11-19 | Discharge: 2019-11-19 | Disposition: A | Discharge status: Home / Self Care | Payer: Self-pay | Source: Ambulatory Visit | Attending: Plastic Surgery | Admitting: Plastic Surgery

## 2019-11-19 ENCOUNTER — Other Ambulatory Visit: Payer: Self-pay

## 2019-11-19 ENCOUNTER — Encounter: Payer: Self-pay | Admitting: Plastic Surgery

## 2019-11-19 VITALS — BP 133/85 | HR 81 | Temp 98.1°F

## 2019-11-19 DIAGNOSIS — D170 Benign lipomatous neoplasm of skin and subcutaneous tissue of head, face and neck: Secondary | ICD-10-CM

## 2019-11-19 NOTE — Progress Notes (Signed)
Operative Note   DATE OF OPERATION: 11/19/2019  LOCATION:    SURGICAL DEPARTMENT: Plastic Surgery  PREOPERATIVE DIAGNOSES:  Right forehead lipoma  POSTOPERATIVE DIAGNOSES:  same  PROCEDURE:  1. Excision of submuscular right forehead lipoma measuring 2.5 cm 2. Complex closure measuring 2.5 cm  SURGEON: Talmadge Coventry, MD  ANESTHESIA:  Local  COMPLICATIONS: None.   INDICATIONS FOR PROCEDURE:  The patient, [name] is a 49 y.o. male born on 1970-09-04, is here for treatment of lipoma forehead. MRN: [id number]  CONSENT:  Informed consent was obtained directly from the patient. Risks, benefits and alternatives were fully discussed. Specific risks including but not limited to bleeding, infection, hematoma, seroma, scarring, pain, infection, wound healing problems, and need for further surgery were all discussed. The patient did have an ample opportunity to have questions answered to satisfaction.   DESCRIPTION OF PROCEDURE:  Local anesthesia was administered. The patient's operative site was prepped and draped in a sterile fashion. A time out was performed and all information was confirmed to be correct.  The lesion was excised with a 15 blade and tenotomy dissection.  It was beneath the frontalis.  Hemostasis was obtained.  Circumferential undermining was performed and the skin was advanced and closed in layers with interrupted buried Monocryl sutures and 5-0 fast gut for the skin.  The lesion excised measured 2.5 cm, and the total length of closure measured 2.5 cm.    The patient tolerated the procedure well.  There were no complications.

## 2019-11-21 LAB — SURGICAL PATHOLOGY

## 2019-11-24 NOTE — Progress Notes (Signed)
Patient did not show for appointment.   

## 2019-11-25 ENCOUNTER — Encounter: Payer: Self-pay | Admitting: Family

## 2019-12-04 ENCOUNTER — Ambulatory Visit: Payer: Self-pay | Attending: Internal Medicine | Admitting: Internal Medicine

## 2019-12-04 ENCOUNTER — Other Ambulatory Visit: Payer: Self-pay

## 2019-12-04 ENCOUNTER — Other Ambulatory Visit: Payer: Self-pay | Admitting: Internal Medicine

## 2019-12-04 ENCOUNTER — Encounter: Payer: Self-pay | Admitting: Internal Medicine

## 2019-12-04 VITALS — BP 123/87 | HR 79 | Temp 97.7°F | Ht 71.0 in | Wt 264.0 lb

## 2019-12-04 DIAGNOSIS — K029 Dental caries, unspecified: Secondary | ICD-10-CM

## 2019-12-04 DIAGNOSIS — Z794 Long term (current) use of insulin: Secondary | ICD-10-CM

## 2019-12-04 DIAGNOSIS — E66812 Obesity, class 2: Secondary | ICD-10-CM

## 2019-12-04 DIAGNOSIS — E119 Type 2 diabetes mellitus without complications: Secondary | ICD-10-CM

## 2019-12-04 DIAGNOSIS — E785 Hyperlipidemia, unspecified: Secondary | ICD-10-CM

## 2019-12-04 DIAGNOSIS — K5909 Other constipation: Secondary | ICD-10-CM

## 2019-12-04 DIAGNOSIS — I10 Essential (primary) hypertension: Secondary | ICD-10-CM

## 2019-12-04 DIAGNOSIS — Z23 Encounter for immunization: Secondary | ICD-10-CM

## 2019-12-04 DIAGNOSIS — E1169 Type 2 diabetes mellitus with other specified complication: Secondary | ICD-10-CM

## 2019-12-04 DIAGNOSIS — F17200 Nicotine dependence, unspecified, uncomplicated: Secondary | ICD-10-CM

## 2019-12-04 LAB — POCT GLYCOSYLATED HEMOGLOBIN (HGB A1C): HbA1c, POC (controlled diabetic range): 6.9 % (ref 0.0–7.0)

## 2019-12-04 LAB — GLUCOSE, POCT (MANUAL RESULT ENTRY): POC Glucose: 167 mg/dl — AB (ref 70–99)

## 2019-12-04 MED ORDER — AMLODIPINE BESYLATE 5 MG PO TABS: 10.0000 mg | ORAL_TABLET | Freq: Every day | ORAL | 6 refills | Status: DC

## 2019-12-04 MED ORDER — DOCUSATE SODIUM 100 MG PO CAPS: 100.0000 mg | ORAL_CAPSULE | Freq: Two times a day (BID) | ORAL | 0 refills | Status: DC

## 2019-12-04 MED ORDER — AMOXICILLIN 500 MG PO CAPS: 500.0000 mg | ORAL_CAPSULE | Freq: Three times a day (TID) | ORAL | 0 refills | Status: DC

## 2019-12-04 MED FILL — AMLODIPINE BESYLATE 5 MG TA: 5 | 30 days supply | Qty: 60 | Fill #0

## 2019-12-04 MED FILL — AMOXICILLIN 500 MG CAPSULE: 500 | 7 days supply | Qty: 21 | Fill #0

## 2019-12-04 NOTE — Patient Instructions (Signed)
Increase Amlodipine your blood pressure medication to 5 mg two tablets daily. Sent prescription for antibiotics and a stool softener to the pharmacy for you to pick up.

## 2019-12-04 NOTE — Progress Notes (Signed)
Has a hair bump on the left side of his face.  Dental referral.

## 2019-12-04 NOTE — Progress Notes (Signed)
Patient ID: [name], male    DOB: 1970/08/26  MRN: [id number]  CC: Diabetes   Subjective: [name] is a 49 y.o. male who presents for chronic ds management.  Wife is with him. His concerns today include:  HTN, HL, tob dep, GERD, DM  HM:  No COVID vaccine as yet.  Will get flu shot today  DIABETES TYPE 2 Last A1C:   Results for orders placed or performed in visit on 12/04/19  POCT glucose (manual entry)  Result Value Ref Range   POC Glucose 167 (A) 70 - 99 mg/dl  POCT glycosylated hemoglobin (Hb A1C)  Result Value Ref Range   Hemoglobin A1C     HbA1c POC (<> result, manual entry)     HbA1c, POC (prediabetic range)     HbA1c, POC (controlled diabetic range) 6.9 0.0 - 7.0 %    Med Adherence:  '[x]'  Yes    he is on glargine insulin 35 units daily Medication side effects:  '[]'  Yes    '[x]'  No Home Monitoring?  '[]'  Yes    '[x]'  No Home glucose results range Diet Adherence: '[x]'  Yes -no sweet drinks, less white starches Exercise: '[x]'  Yes walks 1/2 mile/day Hypoglycemic episodes?: '[]'  Yes    '[x]'  No Numbness of the feet? '[]'  Yes    '[x]'  No Retinopathy hx? '[]'  Yes    '[]'  No Last eye exam: Overdue for eye exam.  No insurance. Coms:   Tob Dep: from 1 pk to 3/4 pk a day.  Plans to contine to cut down with hopes of quitting.  Constipation: use to go twice a day.  Now once a day.  Stools hard  HTN:  Compliant with Norvasc 7.5 mg Limits salt in foods.  No chest pains, shortness of breath, lower extremity edema.  HL:  taking atorvastatin as prescribed. Complains of pain from a decayed tooth in the upper jaw at the front of his mouth.  Requests referral to dentist.   Patient Active Problem List   Diagnosis Date Noted  . Hypertriglyceridemia 01/16/2019  . 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 Alcohol use 01/14/2019  . Volume depletion 01/14/2019  . 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 Hyponatremia 01/14/2019  . Uncontrolled type  2 diabetes mellitus with hyperglycemia ([place]) 01/14/2019  . Medically noncompliant 03/28/2018  . Tobacco dependence 03/28/2018  . Gastroesophageal reflux disease without esophagitis 03/28/2018  . Hyperlipidemia with target LDL less than 100 03/28/2018  . Dental cavities 11/23/2017  . Essential hypertension 11/23/2017  . New daily persistent headache 11/23/2017  . Scalp lump 08/25/2013     Current Outpatient Medications on File Prior to Visit  Medication Sig Dispense Refill  . amLODipine (NORVASC) 5 MG tablet Take 1.5 tablets (7.5 mg total) by mouth daily. 45 tablet 2  . atorvastatin (LIPITOR) 20 MG tablet Take 1 tablet (20 mg total) by mouth daily. 90 tablet 0  . Blood Glucose Monitoring Suppl (TRUE METRIX METER) w/Device KIT Use as instructed to check blood sugar 3 times daily. 1 kit 0  . fenofibrate (TRICOR) 145 MG tablet Take 1 tablet (145 mg total) by mouth daily. 30 tablet 2  . glucose blood test strip Use as instructed to check blood sugar 3 times daily. 100 each 11  . Insulin Glargine (BASAGLAR KWIKPEN) 100 UNIT/ML INJECT 32 UNITS INTO THE SKIN DAILY. 15 mL 0  . Insulin Pen Needle 29G X 12MM MISC Use with insulin pen 100 each  0  . meclizine (ANTIVERT) 25 MG tablet Take 1 tablet (25 mg total) by mouth 3 (three) times daily as needed for dizziness. 30 tablet 2  . omeprazole (PRILOSEC) 40 MG capsule Take 1 capsule (40 mg total) by mouth daily. 30 capsule 2  . ondansetron (ZOFRAN) 8 MG tablet Take 1 tablet (8 mg total) by mouth every 8 (eight) hours as needed for nausea or vomiting. 30 tablet 0  . oxymetazoline (AFRIN NASAL SPRAY) 0.05 % nasal spray Place 1 spray into both nostrils 2 (two) times daily as needed for congestion. 30 mL 0  . TRUEplus Lancets 28G MISC Use as instructed to check blood sugar 3 times daily. 100 each 11   No current facility-administered medications on file prior to visit.    No Known Allergies  Social History   Socioeconomic History  . Marital status:  Married    Spouse name: Not on file  . Number of children: Not on file  . Years of education: Not on file  . Highest education level: Not on file  Occupational History  . Not on file  Tobacco Use  . Smoking status: Current Every Day Smoker    Packs/day: 0.50    Types: Cigarettes  . Smokeless tobacco: Never Used  Vaping Use  . Vaping Use: Never used  Substance and Sexual Activity  . Alcohol use: Yes    Alcohol/week: 12.0 standard drinks    Types: 12 Cans of beer per week  . Drug use: No  . Sexual activity: Not Currently  Other Topics Concern  . Not on file  Social History Narrative  . Not on file   Social Determinants of Health   Financial Resource Strain:   . Difficulty of Paying Living Expenses: Not on file  Food Insecurity:   . Worried About Charity fundraiser in the Last Year: Not on file  . Ran Out of Food in the Last Year: Not on file  Transportation Needs:   . Lack of Transportation (Medical): Not on file  . Lack of Transportation (Non-Medical): Not on file  Physical Activity:   . Days of Exercise per Week: Not on file  . Minutes of Exercise per Session: Not on file  Stress:   . Feeling of Stress : Not on file  Social Connections:   . Frequency of Communication with Friends and Family: Not on file  . Frequency of Social Gatherings with Friends and Family: Not on file  . Attends Religious Services: Not on file  . Active Member of Clubs or Organizations: Not on file  . Attends Archivist Meetings: Not on file  . Marital Status: Not on file  Intimate Partner Violence:   . Fear of Current or Ex-Partner: Not on file  . Emotionally Abused: Not on file  . Physically Abused: Not on file  . Sexually Abused: Not on file    Family History  Problem Relation Age of Onset  . Stroke Father   . Hypertension Father     Past Surgical History:  Procedure Laterality Date  . DG 3RD DIGIT RIGHT HAND    . DG 4TH DIGIT RIGHT HAND      ROS: Review of  Systems Negative except as stated above  PHYSICAL EXAM: BP 123/87   Pulse 79   Temp 97.7 F (36.5 C) (Oral)   Ht '5\' 11"'  (1.803 m)   Wt 264 lb (119.7 kg)   SpO2 97%   BMI 36.82 kg/m   Physical  Exam  General appearance - alert, well appearing, and in no distress Mental status - normal mood, behavior, speech, dress, motor activity, and thought processes Neck - supple, no significant adenopathy Mouth: Decayed incisor upper jaw left side.  Some surrounding erythema of the gum. Chest - clear to auscultation, no wheezes, rales or rhonchi, symmetric air entry Heart - normal rate, regular rhythm, normal S1, S2, no murmurs, rubs, clicks or gallops Extremities - peripheral pulses normal, no pedal edema, no clubbing or cyanosis  CMP Latest Ref Rng & Units 11/07/2019 03/17/2019 01/14/2019  Glucose 70 - 99 mg/dL 188(H) 126(H) 339(H)  BUN 6 - 20 mg/dL '8 10 11  ' Creatinine 0.61 - 1.24 mg/dL 1.09 1.11 0.74(L)  Sodium 135 - 145 mmol/L 138 140 130(L)  Potassium 3.5 - 5.1 mmol/L 3.9 4.7 3.8  Chloride 98 - 111 mmol/L 105 102 95(L)  CO2 22 - 32 mmol/L 20(L) 25 18(L)  Calcium 8.9 - 10.3 mg/dL 9.0 10.0 8.5(L)  Total Protein 6.0 - 8.5 g/dL - - 5.9(L)  Total Bilirubin 0.0 - 1.2 mg/dL - - <0.2  Alkaline Phos 39 - 117 IU/L - - 126(H)  AST 0 - 40 IU/L - - 11  ALT 0 - 44 IU/L - - 11   Lipid Panel     Component Value Date/Time   CHOL 208 (H) 03/17/2019 1654   TRIG 127 03/17/2019 1654   HDL 41 03/17/2019 1654   CHOLHDL 5.1 (H) 03/17/2019 1654   CHOLHDL 5.2 08/25/2013 1746   VLDL 52 (H) 08/25/2013 1746   LDLCALC 144 (H) 03/17/2019 1654    CBC    Component Value Date/Time   WBC 9.2 11/07/2019 1149   RBC 4.41 11/07/2019 1149   HGB 13.5 11/07/2019 1149   HGB 13.2 01/14/2019 1205   HCT 41.3 11/07/2019 1149   HCT 37.9 01/14/2019 1205   PLT 435 (H) 11/07/2019 1149   PLT 333 01/14/2019 1205   MCV 93.7 11/07/2019 1149   MCV 92 01/14/2019 1205   MCH 30.6 11/07/2019 1149   MCHC 32.7 11/07/2019 1149    RDW 12.9 11/07/2019 1149   RDW 12.2 01/14/2019 1205   LYMPHSABS 2.8 11/07/2019 1149   LYMPHSABS 2.3 01/14/2019 1205   MONOABS 0.6 11/07/2019 1149   EOSABS 0.2 11/07/2019 1149   EOSABS 0.1 01/14/2019 1205   BASOSABS 0.1 11/07/2019 1149   BASOSABS 0.0 01/14/2019 1205    ASSESSMENT AND PLAN:  1. Type 2 diabetes mellitus without complication, with long-term current use of insulin (HCC) Continue Lantus insulin.  Encouraged him to continue trying to eat healthy and continue regular exercise.  Try to get an eye exam when he can afford it. - POCT glucose (manual entry) - POCT glycosylated hemoglobin (Hb A1C) - Lipid panel - Microalbumin / creatinine urine ratio  2. Essential hypertension Not at goal.  Increase Norvasc to 10 mg daily. - amLODipine (NORVASC) 5 MG tablet; Take 2 tablets (10 mg total) by mouth daily.  Dispense: 60 tablet; Refill: 6  3. Tobacco dependence Advised to quit.  Discussed health risks associated with smoking.  He is actively trying to quit.  He declines any medication to help him quit.  Encouraged him to set a quit date.  Less than 5 minutes spent on counseling.  4. Hyperlipidemia associated with type 2 diabetes mellitus ([place]) Continue atorvastatin - Lipid panel - Hepatic Function Panel  5. Class 2 severe obesity due to excess calories with serious comorbidity in adult, unspecified BMI ([place]) See #1 above  6. Dental cavity - amoxicillin (AMOXIL) 500 MG capsule; Take 1 capsule (500 mg total) by mouth 3 (three) times daily.  Dispense: 21 capsule; Refill: 0 - Ambulatory referral to Dentistry  7. Other constipation Encourage eating more fresh fruits and vegetables that are high in fiber - docusate sodium (COLACE) 100 MG capsule; Take 1 capsule (100 mg total) by mouth 2 (two) times daily.  Dispense: 10 capsule; Refill: 0  8. Need for immunization against influenza - Flu Vaccine QUAD 36+ mos IM    Patient was given the opportunity to ask questions.  Patient  verbalized understanding of the plan and was able to repeat key elements of the plan.   Orders Placed This Encounter  Procedures  . POCT glucose (manual entry)  . POCT glycosylated hemoglobin (Hb A1C)     Requested Prescriptions    No prescriptions requested or ordered in this encounter    No follow-ups on file.  Karle Plumber, MD, FACP

## 2019-12-05 LAB — HEPATIC FUNCTION PANEL
ALT: 12 IU/L (ref 0–44)
AST: 11 IU/L (ref 0–40)
Albumin: 4.7 g/dL (ref 4.0–5.0)
Alkaline Phosphatase: 66 IU/L (ref 44–121)
Bilirubin Total: 0.4 mg/dL (ref 0.0–1.2)
Bilirubin, Direct: <0.1 mg/dL (ref 0.00–0.40)
Total Protein: 7.3 g/dL (ref 6.0–8.5)

## 2019-12-05 LAB — MICROALBUMIN / CREATININE URINE RATIO
Creatinine, Urine: 142.9 mg/dL
Microalb/Creat Ratio: 2 mg/g creat (ref 0–29)
Microalbumin, Urine: 3.1 ug/mL

## 2019-12-05 LAB — LIPID PANEL
Chol/HDL Ratio: 6.1 ratio — ABNORMAL HIGH (ref 0.0–5.0)
Cholesterol, Total: 212 mg/dL — ABNORMAL HIGH (ref 100–199)
HDL: 35 mg/dL — ABNORMAL LOW (ref >39)
LDL Chol Calc (NIH): 143 mg/dL — ABNORMAL HIGH (ref 0–99)
Triglycerides: 187 mg/dL — ABNORMAL HIGH (ref 0–149)
VLDL Cholesterol Cal: 34 mg/dL (ref 5–40)

## 2019-12-06 NOTE — Progress Notes (Signed)
Let patient know that his cholesterol levels are elevated suggesting that he may not be taking the atorvastatin consistently.  Please find out whether he is taking the atorvastatin 20 mg daily.  If he has been taking it consistently then we will need to increase the dose to 40 mg to get his cholesterol better.  Please let me know if we need to increase the dose.  His liver function test is normal.

## 2019-12-08 ENCOUNTER — Other Ambulatory Visit: Payer: Self-pay | Admitting: Internal Medicine

## 2019-12-08 ENCOUNTER — Telehealth: Payer: Self-pay

## 2019-12-08 DIAGNOSIS — E785 Hyperlipidemia, unspecified: Secondary | ICD-10-CM

## 2019-12-08 MED ORDER — ATORVASTATIN CALCIUM 40 MG PO TABS: 40.0000 mg | ORAL_TABLET | Freq: Every day | ORAL | 1 refills | Status: DC

## 2019-12-08 NOTE — Telephone Encounter (Signed)
-----   Message from Ladell Pier, MD sent at 12/06/2019 10:53 AM EDT ----- Let patient know that his cholesterol levels are elevated suggesting that he may not be taking the atorvastatin consistently.  Please find out whether he is taking the atorvastatin 20 mg daily.  If he has been taking it consistently then we will need to increase the dose to 40 mg to get his cholesterol better.  Please let me know if we need to increase the dose.  His liver function test is normal.

## 2019-12-08 NOTE — Telephone Encounter (Signed)
Patient name and DOB has been verified Patient was informed of lab results. Patient had no questions.  He states that he has been taking his medication, I informed him that it has been increased. Script needs to be sent to pharmacy.

## 2019-12-09 MED FILL — ATORVASTATIN CALCIUM 40 MG: 40 | 30 days supply | Qty: 30 | Fill #0

## 2019-12-17 ENCOUNTER — Telehealth: Payer: Self-pay | Admitting: Internal Medicine

## 2019-12-17 DIAGNOSIS — K219 Gastro-esophageal reflux disease without esophagitis: Secondary | ICD-10-CM

## 2019-12-17 MED FILL — !LANTUS SOLOSTAR 100UNITS/M: 100 | 28 days supply | Qty: 9 | Fill #1

## 2019-12-17 MED FILL — OMEPRAZOLE DR 40 MG CAPSULE: 40 | 30 days supply | Qty: 30 | Fill #2

## 2019-12-17 MED FILL — FENOFIBRATE 145 MG TABLET: 145 | 30 days supply | Qty: 30 | Fill #2

## 2019-12-17 NOTE — Telephone Encounter (Signed)
[facility] called and spoke to [place], [employer]chant [employer] officer about the refills requested. She says the patient picked up Amlodipine on 12/04/19 and the others he has refills that will be ready for pickup. She says to let the patient know they close tomorrow at 1200. I called the patient and advised of the above and that the medications are ready for pickup before 1200 tomorrow. He verbalized understanding.

## 2019-12-17 NOTE — Telephone Encounter (Signed)
Copied from [place] [id number]. Topic: Quick Communication - Rx Refill/Question >> Dec 17, 2019  3:43 PM Mcneil, Ja-Kwan wrote: Medication: fenofibrate (TRICOR) 145 MG tablet, amLODipine (NORVASC) 5 MG tablet, omeprazole (PRILOSEC) 40 MG capsule, and Insulin Glargine (BASAGLAR KWIKPEN) 100 UNIT/ML  Has the patient contacted their pharmacy? no  Preferred Pharmacy (with phone number or street name): [facility], [address]  Phone: [phone]   Fax: [phone]  Agent: Please be advised that RX refills may take up to 3 business days. We ask that you follow-up with your pharmacy.

## 2019-12-26 NOTE — Telephone Encounter (Signed)
Pts spouse calling in regarding these medications. Please advise.

## 2019-12-29 MED FILL — OMEPRAZOLE DR 40 MG CAPSULE: 40 | 30 days supply | Qty: 30 | Fill #2

## 2019-12-29 MED FILL — FENOFIBRATE 145 MG TABLET: 145 | 30 days supply | Qty: 30 | Fill #2

## 2019-12-29 MED FILL — AMLODIPINE BESYLATE 5 MG TA: 5 | 30 days supply | Qty: 45 | Fill #2

## 2019-12-29 MED FILL — ?BASAGLAR 100 UNITS/ML KWPE: 100 | 18 days supply | Qty: 6 | Fill #1

## 2019-12-29 NOTE — Telephone Encounter (Signed)
Refills available in our pharmacy. I have placed these for him. Attempted to call patient to inform of this. No answer. Left HIPAA-compliant VM to return call.

## 2020-01-04 ENCOUNTER — Other Ambulatory Visit: Payer: Self-pay

## 2020-01-04 ENCOUNTER — Emergency Department (HOSPITAL_COMMUNITY): Payer: Self-pay

## 2020-01-04 ENCOUNTER — Emergency Department (HOSPITAL_COMMUNITY)
Admission: EM | Admit: 2020-01-04 | Discharge: 2020-01-04 | Disposition: A | Discharge status: Home / Self Care | Payer: Self-pay | Attending: Emergency Medicine | Admitting: Emergency Medicine

## 2020-01-04 DIAGNOSIS — R739 Hyperglycemia, unspecified: Secondary | ICD-10-CM

## 2020-01-04 DIAGNOSIS — R109 Unspecified abdominal pain: Secondary | ICD-10-CM | POA: Diagnosis present

## 2020-01-04 DIAGNOSIS — I10 Essential (primary) hypertension: Secondary | ICD-10-CM | POA: Insufficient documentation

## 2020-01-04 DIAGNOSIS — R519 Headache, unspecified: Secondary | ICD-10-CM | POA: Diagnosis not present

## 2020-01-04 DIAGNOSIS — E1169 Type 2 diabetes mellitus with other specified complication: Secondary | ICD-10-CM | POA: Diagnosis not present

## 2020-01-04 DIAGNOSIS — Z794 Long term (current) use of insulin: Secondary | ICD-10-CM | POA: Diagnosis not present

## 2020-01-04 DIAGNOSIS — E1110 Type 2 diabetes mellitus with ketoacidosis without coma: Secondary | ICD-10-CM | POA: Diagnosis not present

## 2020-01-04 DIAGNOSIS — Z79899 Other long term (current) drug therapy: Secondary | ICD-10-CM | POA: Diagnosis not present

## 2020-01-04 LAB — COMPREHENSIVE METABOLIC PANEL
ALT: 27 U/L (ref 0–44)
AST: 24 U/L (ref 15–41)
Albumin: 4.6 g/dL (ref 3.5–5.0)
Alkaline Phosphatase: 53 U/L (ref 38–126)
Anion gap: 11 (ref 5–15)
BUN: 9 mg/dL (ref 6–20)
CO2: 24 mmol/L (ref 22–32)
Calcium: 9.2 mg/dL (ref 8.9–10.3)
Chloride: 103 mmol/L (ref 98–111)
Creatinine, Ser: 0.91 mg/dL (ref 0.61–1.24)
GFR, Estimated: >60 mL/min (ref >60)
Glucose, Bld: 165 mg/dL — ABNORMAL HIGH (ref 70–99)
Potassium: 4.2 mmol/L (ref 3.5–5.1)
Sodium: 138 mmol/L (ref 135–145)
Total Bilirubin: 0.5 mg/dL (ref 0.3–1.2)
Total Protein: 7.8 g/dL (ref 6.5–8.1)

## 2020-01-04 LAB — CBC WITH DIFFERENTIAL/PLATELET
Abs Immature Granulocytes: 0.05 10*3/uL (ref 0.00–0.07)
Basophils Absolute: 0.1 10*3/uL (ref 0.0–0.1)
Basophils Relative: 1 %
Eosinophils Absolute: 0.2 10*3/uL (ref 0.0–0.5)
Eosinophils Relative: 2 %
HCT: 47.5 % (ref 39.0–52.0)
Hemoglobin: 16.5 g/dL (ref 13.0–17.0)
Immature Granulocytes: 1 %
Lymphocytes Relative: 40 %
Lymphs Abs: 2.9 10*3/uL (ref 0.7–4.0)
MCH: 31.7 pg (ref 26.0–34.0)
MCHC: 34.7 g/dL (ref 30.0–36.0)
MCV: 91.3 fL (ref 80.0–100.0)
Monocytes Absolute: 0.6 10*3/uL (ref 0.1–1.0)
Monocytes Relative: 8 %
Neutro Abs: 3.6 10*3/uL (ref 1.7–7.7)
Neutrophils Relative %: 48 %
Platelets: 402 10*3/uL — ABNORMAL HIGH (ref 150–400)
RBC: 5.2 MIL/uL (ref 4.22–5.81)
RDW: 12.8 % (ref 11.5–15.5)
WBC: 7.4 10*3/uL (ref 4.0–10.5)
nRBC: 0 % (ref 0.0–0.2)

## 2020-01-04 LAB — LIPASE, BLOOD: Lipase: 26 U/L (ref 11–51)

## 2020-01-04 LAB — CBG MONITORING, ED: Glucose-Capillary: 184 mg/dL — ABNORMAL HIGH (ref 70–99)

## 2020-01-04 LAB — ETHANOL: Alcohol, Ethyl (B): 85 mg/dL — ABNORMAL HIGH (ref <10)

## 2020-01-04 MED ORDER — SODIUM CHLORIDE 0.9 % IV SOLN: INTRAVENOUS | Status: DC

## 2020-01-04 MED ORDER — ONDANSETRON HCL 4 MG/2ML IJ SOLN
4.0000 mg | Freq: Once | INTRAMUSCULAR | Status: AC
  Administered 2020-01-04: 4 mg via INTRAVENOUS
  Filled 2020-01-04: qty 2

## 2020-01-04 MED ORDER — SODIUM CHLORIDE 0.9 % IV BOLUS
1000.0000 mL | Freq: Once | INTRAVENOUS | Status: AC
  Administered 2020-01-04: 1000 mL via INTRAVENOUS

## 2020-01-04 MED ORDER — INSULIN GLARGINE 100 UNIT/ML ~~LOC~~ SOLN
32.0000 [IU] | Freq: Once | SUBCUTANEOUS | Status: AC
  Administered 2020-01-04: 32 [IU] via SUBCUTANEOUS
  Filled 2020-01-04: qty 0.32

## 2020-01-04 NOTE — Discharge Instructions (Addendum)
This patient had a CAT scan of the brain which was negative. Blood sugar was treated with IV fluids along with his normal morning dose of insulin. He has no sign of diabetic ketoacidosis. He is medically cleared

## 2020-01-04 NOTE — ED Notes (Signed)
Pt to CT

## 2020-01-04 NOTE — ED Provider Notes (Signed)
[facility] DEPT Provider Note   CSN: [id number] Arrival date & time: 01/04/20  1001     History Chief Complaint  Patient presents with  . Medical Clearance    [name] is a 49 y.o. male.  49 year old male presents in custody with [employer] complaining of having abdominal discomfort as well as headache from possible head trauma.  He has a history of type 1 diabetes and does take insulin injections but has not had his morning insulin for 2 days.  Also admits to drinking alcohol and have some epigastric pain with nonbilious emesis.  No fever or chills.  Reportedly had some issues with his gait while being arrested which then suddenly improved.  He denies any back discomfort.  No weakness to his legs.        Past Medical History:  Diagnosis Date  . Renal disorder     Patient Active Problem List   Diagnosis Date Noted  . Class 2 severe obesity due to excess calories with serious comorbidity in adult ([place]) 12/04/2019  . Hypertriglyceridemia 01/16/2019  . 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 Alcohol use 01/14/2019  . Volume depletion 01/14/2019  . 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 Hyponatremia 01/14/2019  . Uncontrolled type 2 diabetes mellitus with hyperglycemia ([place]) 01/14/2019  . Medically noncompliant 03/28/2018  . Tobacco dependence 03/28/2018  . Gastroesophageal reflux disease without esophagitis 03/28/2018  . Hyperlipidemia with target LDL less than 100 03/28/2018  . Dental cavities 11/23/2017  . Essential hypertension 11/23/2017  . New daily persistent headache 11/23/2017  . Scalp lump 08/25/2013    Past Surgical History:  Procedure Laterality Date  . DG 3RD DIGIT RIGHT HAND    . DG 4TH DIGIT RIGHT HAND         Family History  Problem Relation Age of Onset  . Stroke Father   . Hypertension Father     Social History   Tobacco Use  . Smoking status: Current Every  Day Smoker    Packs/day: 0.50    Types: Cigarettes  . Smokeless tobacco: Never Used  Vaping Use  . Vaping Use: Never used  Substance Use Topics  . Alcohol use: Yes    Alcohol/week: 12.0 standard drinks    Types: 12 Cans of beer per week  . Drug use: No    Home Medications Prior to Admission medications   Medication Sig Start Date End Date Taking? Authorizing Provider  amLODipine (NORVASC) 5 MG tablet Take 2 tablets (10 mg total) by mouth daily. 12/04/19   Ladell Pier, MD  amoxicillin (AMOXIL) 500 MG capsule Take 1 capsule (500 mg total) by mouth 3 (three) times daily. 12/04/19   Ladell Pier, MD  atorvastatin (LIPITOR) 40 MG tablet Take 1 tablet (40 mg total) by mouth daily. 12/08/19   Ladell Pier, MD  Blood Glucose Monitoring Suppl (TRUE METRIX METER) w/Device KIT Use as instructed to check blood sugar 3 times daily. 01/14/19   Ladell Pier, MD  docusate sodium (COLACE) 100 MG capsule Take 1 capsule (100 mg total) by mouth 2 (two) times daily. 12/04/19   Ladell Pier, MD  fenofibrate (TRICOR) 145 MG tablet Take 1 tablet (145 mg total) by mouth daily. 10/10/19   Ladell Pier, MD  glucose blood test strip Use as instructed to check blood sugar 3 times daily. 01/14/19   Ladell Pier, MD  Insulin Glargine [facility]) 100 UNIT/ML INJECT  32 UNITS INTO THE SKIN DAILY. 11/12/19   Ladell Pier, MD  Insulin Pen Needle 29G X 12MM MISC Use with insulin pen 10/10/19   Ladell Pier, MD  meclizine (ANTIVERT) 25 MG tablet Take 1 tablet (25 mg total) by mouth 3 (three) times daily as needed for dizziness. 05/29/19   Ladell Pier, MD  omeprazole (PRILOSEC) 40 MG capsule Take 1 capsule (40 mg total) by mouth daily. 10/10/19   Ladell Pier, MD  ondansetron (ZOFRAN) 8 MG tablet Take 1 tablet (8 mg total) by mouth every 8 (eight) hours as needed for nausea or vomiting. 05/29/19   Ladell Pier, MD  oxymetazoline (AFRIN NASAL SPRAY) 0.05 %  nasal spray Place 1 spray into both nostrils 2 (two) times daily as needed for congestion. 08/03/18   McDonald, Mia A, PA-C  TRUEplus Lancets 28G MISC Use as instructed to check blood sugar 3 times daily. 01/14/19   Ladell Pier, MD    Allergies    Patient has no known allergies.  Review of Systems   Review of Systems  All other systems reviewed and are negative.   Physical Exam Updated Vital Signs BP (!) 153/102 (BP Location: Right Arm)   Pulse 71   Temp (!) 97.5 F (36.4 C) (Oral) Comment: Pt could not keep mouth closed for entire measurement, this RN took temp twice  Resp 18   SpO2 96%   Physical Exam Vitals and nursing note reviewed.  Constitutional:      General: He is not in acute distress.    Appearance: Normal appearance. He is well-developed. He is not toxic-appearing.  HENT:     Head: Normocephalic and atraumatic.  Eyes:     General: Lids are normal.     Conjunctiva/sclera: Conjunctivae normal.     Pupils: Pupils are equal, round, and reactive to light.  Neck:     Thyroid: No thyroid mass.     Trachea: No tracheal deviation.  Cardiovascular:     Rate and Rhythm: Normal rate and regular rhythm.     Heart sounds: Normal heart sounds. No murmur heard.  No gallop.   Pulmonary:     Effort: Pulmonary effort is normal. No respiratory distress.     Breath sounds: Normal breath sounds. No stridor. No decreased breath sounds, wheezing, rhonchi or rales.  Abdominal:     General: Bowel sounds are normal. There is no distension.     Palpations: Abdomen is soft.     Tenderness: There is generalized abdominal tenderness. There is no rebound.  Musculoskeletal:        General: No tenderness. Normal range of motion.     Cervical back: Normal range of motion and neck supple.  Skin:    General: Skin is warm and dry.     Findings: No abrasion or rash.  Neurological:     Mental Status: He is alert and oriented to person, place, and time.     GCS: GCS eye subscore is 4.  GCS verbal subscore is 5. GCS motor subscore is 6.     Cranial Nerves: No cranial nerve deficit.     Sensory: No sensory deficit.     Motor: Motor function is intact.     Coordination: Coordination is intact.     Comments: Patient able to ambulate with broad-based gait.  Psychiatric:        Speech: Speech normal.        Behavior: Behavior normal.  ED Results / Procedures / Treatments   Labs (all labs ordered are listed, but only abnormal results are displayed) Labs Reviewed  CBG MONITORING, ED - Abnormal; Notable for the following components:      Result Value   Glucose-Capillary 184 (*)    All other components within normal limits  CBC WITH DIFFERENTIAL/PLATELET  COMPREHENSIVE METABOLIC PANEL  LIPASE, BLOOD    EKG None  Radiology No results found.  Procedures Procedures (including critical care time)  Medications Ordered in ED Medications  sodium chloride 0.9 % bolus 1,000 mL (has no administration in time range)  0.9 %  sodium chloride infusion (has no administration in time range)  ondansetron (ZOFRAN) injection 4 mg (has no administration in time range)  Basaglar KwikPen KwikPen 32 Units (has no administration in time range)    ED Course  I have reviewed the triage vital signs and the nursing notes.  Pertinent labs & imaging results that were available during my care of the patient were reviewed by me and considered in my medical decision making (see chart for details).    MDM Rules/Calculators/A&P                          Patient given IV fluids here along with Zofran. Given his dose of insulin here. Blood sugar noted to be 165. Head CT negative. He is medically cleared for incarceration Final Clinical Impression(s) / ED Diagnoses Final diagnoses:  None    Rx / DC Orders ED Discharge Orders    None       Lacretia Leigh, MD 01/04/20 1240
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
## 2020-01-14 MED FILL — ?BASAGLAR 100 UNITS/ML KWPE: 100 | 28 days supply | Qty: 9 | Fill #1

## 2020-01-14 MED FILL — ?ATORVASTATIN 40MG TABLET: 40 | 30 days supply | Qty: 30 | Fill #1

## 2020-02-03 ENCOUNTER — Other Ambulatory Visit: Payer: Self-pay | Admitting: Pharmacist

## 2020-02-03 ENCOUNTER — Other Ambulatory Visit: Payer: Self-pay | Admitting: Family Medicine

## 2020-02-03 ENCOUNTER — Other Ambulatory Visit: Payer: Self-pay | Admitting: Internal Medicine

## 2020-02-03 MED ORDER — AMLODIPINE BESYLATE 10 MG PO TABS
10.0000 mg | ORAL_TABLET | Freq: Every day | ORAL | 2 refills | Status: DC
Start: 2020-02-03 — End: 2020-07-15

## 2020-02-03 MED ORDER — AMLODIPINE BESYLATE 10 MG PO TABS: 10.0000 mg | ORAL_TABLET | Freq: Every day | ORAL | 2 refills | Status: DC

## 2020-02-03 MED FILL — OMEPRAZOLE DR 40 MG CAPSULE: 40 | 30 days supply | Qty: 30 | Fill #5

## 2020-02-03 MED FILL — AMLODIPINE BESYLATE 10 MG T: 10 | 30 days supply | Qty: 30 | Fill #0

## 2020-02-20 MED FILL — ?BASAGLAR 100 UNITS/ML KWPE: 100 | 28 days supply | Qty: 9 | Fill #2

## 2020-02-20 MED FILL — ?ATORVASTATIN 40MG TABLET: 40 | 30 days supply | Qty: 30 | Fill #2

## 2020-03-11 MED FILL — OMEPRAZOLE DR 40 MG CAPSULE: 40 | 30 days supply | Qty: 30 | Fill #5

## 2020-03-11 MED FILL — AMLODIPINE BESYLATE 10 MG T: 10 | 30 days supply | Qty: 30 | Fill #1

## 2020-03-19 MED FILL — OMEPRAZOLE DR 40 MG CAPSULE: 40 | 30 days supply | Qty: 30 | Fill #5

## 2020-03-19 MED FILL — LANTUS SOLOSTAR 100 UNITS/M: 100 | 28 days supply | Qty: 9 | Fill #2

## 2020-03-19 MED FILL — ATORVASTATIN CALCIUM 40 MG: 40 | 30 days supply | Qty: 30 | Fill #3

## 2020-03-19 MED FILL — AMLODIPINE BESYLATE 10 MG T: 10 | 30 days supply | Qty: 30 | Fill #1

## 2020-03-22 MED FILL — LANTUS SOLOSTAR 100 UNITS/M: 100 | 28 days supply | Qty: 9 | Fill #2

## 2020-04-02 ENCOUNTER — Encounter: Payer: Self-pay | Admitting: Internal Medicine

## 2020-04-02 ENCOUNTER — Ambulatory Visit: Attending: Internal Medicine | Admitting: Internal Medicine

## 2020-04-02 ENCOUNTER — Other Ambulatory Visit: Payer: Self-pay

## 2020-04-02 DIAGNOSIS — Z23 Encounter for immunization: Secondary | ICD-10-CM

## 2020-04-02 DIAGNOSIS — I10 Essential (primary) hypertension: Secondary | ICD-10-CM

## 2020-04-02 DIAGNOSIS — E669 Obesity, unspecified: Secondary | ICD-10-CM | POA: Diagnosis not present

## 2020-04-02 DIAGNOSIS — F17200 Nicotine dependence, unspecified, uncomplicated: Secondary | ICD-10-CM

## 2020-04-02 DIAGNOSIS — E785 Hyperlipidemia, unspecified: Secondary | ICD-10-CM | POA: Insufficient documentation

## 2020-04-02 DIAGNOSIS — E1169 Type 2 diabetes mellitus with other specified complication: Secondary | ICD-10-CM | POA: Diagnosis not present

## 2020-04-02 LAB — GLUCOSE, POCT (MANUAL RESULT ENTRY): POC Glucose: 153 mg/dl — AB (ref 70–99)

## 2020-04-02 LAB — POCT GLYCOSYLATED HEMOGLOBIN (HGB A1C): HbA1c, POC (controlled diabetic range): 6.3 % (ref 0.0–7.0)

## 2020-04-02 MED FILL — OMEPRAZOLE DR 40 MG CAPSULE: 40 | 30 days supply | Qty: 30 | Fill #5

## 2020-04-02 MED FILL — AMLODIPINE BESYLATE 10 MG T: 10 | 30 days supply | Qty: 30 | Fill #1

## 2020-04-02 MED FILL — ATORVASTATIN CALCIUM 40 MG: 40 | 30 days supply | Qty: 30 | Fill #3

## 2020-04-02 NOTE — Progress Notes (Signed)
Virtual Visit via Telephone Note  I connected with [name] on 04/02/20 at 4:51 p.m by telephone and verified that I am speaking with the correct person using two identifiers. This was supposed to be an in person visit at 2:10 PM.  However patient no showed the appointment stating that he forgot.  He requested to do it by telephone. Location: Patient: at pharmacy Provider: office The patient, my CMA and myself participated in this encounter.  I discussed the limitations, risks, security and privacy concerns of performing an evaluation and management service by telephone and the availability of in person appointments. I also discussed with the patient that there may be a patient responsible charge related to this service. The patient expressed understanding and agreed to proceed.   History of Present Illness: HTN, HL, tob dep, GERD, DM  DIABETES TYPE 2 Last A1C:   Results for orders placed or performed in visit on 04/02/20  POCT glucose (manual entry)  Result Value Ref Range   POC Glucose 153 (A) 70 - 99 mg/dl  POCT glycosylated hemoglobin (Hb A1C)  Result Value Ref Range   Hemoglobin A1C     HbA1c POC (<> result, manual entry)     HbA1c, POC (prediabetic range)     HbA1c, POC (controlled diabetic range) 6.3 0.0 - 7.0 %    Med Adherence:  _0  Yes    _1  No Medication side effects:  _2  Yes    _3  No Home Monitoring?  _4  Yes  2 x a day after meals Home glucose results range: 150s Diet Adherence: _5  Yes    _6  No Exercise: _7  Yes  -work 12 hr days, constantly moving every day Hypoglycemic episodes?: _8  Yes  -occasionally Numbness of the feet? _9  Yes    _10  No Retinopathy hx? _11  Yes    _12  No Last eye exam:  Over due for eye exam.  No insurance Comments:   HYPERTENSION Currently taking: see medication list Med Adherence: _13  Yes  But out of Norvasc x 2 wks, filled today at the pharmacy Medication side effects: _14  Yes    _15  No Adherence with salt restriction: _16  Yes    _17   No Home Monitoring?: _18  Yes    _19  No Monitoring Frequency:2 x a wk at blood bank Home BP results range: 130-140/60 SOB? _20  Yes    _21  No Chest Pain?: _22  Yes    _23  No Leg swelling?: _24  Yes    _25  No Headaches?: _26  Yes    _27  No Dizziness? _28  Yes    _29  No Comments:   HL:  Taking and tolerating Lipitor.  He does not have the fenofibrate Tob dep:  1/2 pk/day.  Not ready to quit  HM:  Received 2 shots of Moderna.   Outpatient Encounter Medications as of 04/02/2020  Medication Sig  . amLODipine (NORVASC) 10 MG tablet Take 1 tablet (10 mg total) by mouth daily.  Marland Kitchen amoxicillin (AMOXIL) 500 MG capsule Take 1 capsule (500 mg total) by mouth 3 (three) times daily. (Patient not taking: Reported on 01/04/2020)  . atorvastatin (LIPITOR) 40 MG tablet Take 1 tablet (40 mg total) by mouth daily.  . Blood Glucose Monitoring Suppl (TRUE METRIX METER) w/Device KIT Use as instructed to check blood sugar 3 times daily.  Marland Kitchen docusate sodium (COLACE) 100 MG capsule Take 1 capsule (100 mg total) by mouth 2 (two) times daily.  . fenofibrate (TRICOR) 145 MG tablet TAKE 1 TABLET (145 MG TOTAL) BY MOUTH DAILY.  Marland Kitchen  glucose blood test strip Use as instructed to check blood sugar 3 times daily.  . Insulin Glargine (BASAGLAR KWIKPEN) 100 UNIT/ML INJECT 32 UNITS INTO THE SKIN DAILY. (Patient taking differently: Inject 32 Units into the skin daily. INJECT 32 UNITS INTO THE SKIN DAILY.)  . Insulin Pen Needle 29G X 12MM MISC Use with insulin pen  . meclizine (ANTIVERT) 25 MG tablet Take 1 tablet (25 mg total) by mouth 3 (three) times daily as needed for dizziness. (Patient not taking: Reported on 01/04/2020)  . omeprazole (PRILOSEC) 40 MG capsule Take 1 capsule (40 mg total) by mouth daily.  . ondansetron (ZOFRAN) 8 MG tablet Take 1 tablet (8 mg total) by mouth every 8 (eight) hours as needed for nausea or vomiting. (Patient not taking: Reported on 01/04/2020)  . oxymetazoline (AFRIN NASAL SPRAY) 0.05 % nasal spray Place 1  spray into both nostrils 2 (two) times daily as needed for congestion. (Patient not taking: Reported on 01/04/2020)  . TRUEplus Lancets 28G MISC Use as instructed to check blood sugar 3 times daily.   No facility-administered encounter medications on file as of 04/02/2020.      Observations/Objective: No direct observation done as this was a telephone encounter.   Chemistry      Component Value Date/Time   NA 138 01/04/2020 1034   NA 140 03/17/2019 1654   K 4.2 01/04/2020 1034   CL 103 01/04/2020 1034   CO2 24 01/04/2020 1034   BUN 9 01/04/2020 1034   BUN 10 03/17/2019 1654   CREATININE 0.91 01/04/2020 1034   CREATININE 1.25 08/25/2013 1746      Component Value Date/Time   CALCIUM 9.2 01/04/2020 1034   ALKPHOS 53 01/04/2020 1034   AST 24 01/04/2020 1034   ALT 27 01/04/2020 1034   BILITOT 0.5 01/04/2020 1034   BILITOT 0.4 12/04/2019 1516     Lab Results  Component Value Date   WBC 7.4 01/04/2020   HGB 16.5 01/04/2020   HCT 47.5 01/04/2020   MCV 91.3 01/04/2020   PLT 402 (H) 01/04/2020     Assessment and Plan: 1. Type 2 diabetes mellitus with obesity ([place]) Diabetes under pretty good control.  Continue current dose of glargine insulin.  Encouraged him to stay active.  Continue healthy eating habits. - POCT glucose (manual entry) - POCT glycosylated hemoglobin (Hb A1C)  2. Essential hypertension Reported blood pressure readings not at goal with goal being 130/80 or lower.  I recommend that he get back on the Norvasc and continue taking it.  He did get it filled today at the pharmacy.  3. Hyperlipidemia associated with type 2 diabetes mellitus (HCC) Continue atorvastatin.  He has refill on the fenofibrate also.  4. Tobacco dependence Advised to quit.  Patient not ready to give a trial of quitting.  Less than 5 minutes spent on counseling.  5.  COVID-19 vaccine series started.  Follow Up Instructions: 3-4 mths   I discussed the assessment and treatment plan with  the patient. The patient was provided an opportunity to ask questions and all were answered. The patient agreed with the plan and demonstrated an understanding of the instructions.   The patient was advised to call back or seek an in-person evaluation if the symptoms worsen or if the condition fails to improve as anticipated.  I provided 10 minutes of non-face-to-face time during this encounter.   Karle Plumber, MD

## 2020-05-10 ENCOUNTER — Other Ambulatory Visit: Payer: Self-pay

## 2020-05-10 ENCOUNTER — Other Ambulatory Visit: Payer: Self-pay | Admitting: Family Medicine

## 2020-05-10 MED FILL — Atorvastatin Calcium Tab 40 MG (Base Equivalent): ORAL | 30 days supply | Qty: 30 | Fill #0 | Status: AC

## 2020-05-10 MED FILL — Amlodipine Besylate Tab 10 MG (Base Equivalent): ORAL | 30 days supply | Qty: 30 | Fill #0 | Status: AC

## 2020-05-10 NOTE — Telephone Encounter (Signed)
Requested medication (s) are due for refill today: yes  Requested medication (s) are on the active medication list: yes  Last refill: 11/12/2019  Future visit scheduled: no    Requested Prescriptions  Pending Prescriptions Disp Refills   Insulin Glargine (BASAGLAR KWIKPEN) 100 UNIT/ML 15 mL 0    Sig: INJECT 32 UNITS INTO THE SKIN DAILY.      Endocrinology:  Diabetes - Insulins Failed - 05/10/2020  1:52 PM      Failed - HBA1C is between 0 and 7.9 and within 180 days    No results found for: HGBA1C, LABA1C        Failed - Valid encounter within last 6 months    Recent Outpatient Visits   None

## 2020-05-10 NOTE — Telephone Encounter (Signed)
Medication:Rx #: [id number] atorvastatin (LIPITor tablet [id number] , Rx #: [id number] amLODipine (NORVASC) 10 MG tablet [[id number]] , Rx #: [id number] Insulin Glargine (BASAGLAR KWIKPEN) 100 UNIT/ML [[id number]] , Rx #: [id number]  omeprazole (PRILOSEC) 40 MG capsule [[id number]]     Has the patient contacted their pharmacy? YES (Agent: If no, request that the patient contact the pharmacy for the refill.) (Agent: If yes, when and what did the pharmacy advise?)  Preferred Pharmacy (with phone number or street name): [place]: Please be advised that RX refills may take up to 3 business days. We ask that you follow-up with your pharmacy.

## 2020-05-11 ENCOUNTER — Other Ambulatory Visit: Payer: Self-pay | Admitting: Internal Medicine

## 2020-05-11 ENCOUNTER — Other Ambulatory Visit: Payer: Self-pay

## 2020-05-11 NOTE — Telephone Encounter (Signed)
   Notes to clinic:  Pharmacy requesting change of medication  Review for change    Requested Prescriptions  Pending Prescriptions Disp Refills   insulin glargine (LANTUS SOLOSTAR) 100 UNIT/ML Solostar Pen 9 mL 2    Sig: INJECT 32 UNITS INTO THE SKIN DAILY.      Endocrinology:  Diabetes - Insulins Failed - 05/11/2020  1:33 PM      Failed - HBA1C is between 0 and 7.9 and within 180 days    No results found for: HGBA1C, LABA1C        Failed - Valid encounter within last 6 months    Recent Outpatient Visits   None

## 2020-05-12 ENCOUNTER — Other Ambulatory Visit: Payer: Self-pay

## 2020-05-12 ENCOUNTER — Other Ambulatory Visit: Payer: Self-pay | Admitting: Pharmacist

## 2020-05-12 ENCOUNTER — Telehealth: Payer: Self-pay

## 2020-05-12 MED ORDER — BASAGLAR KWIKPEN 100 UNIT/ML ~~LOC~~ SOPN
PEN_INJECTOR | SUBCUTANEOUS | 2 refills | Status: DC
  Filled 2020-05-12 – 2020-06-16 (×2): qty 9, 28d supply, fill #0

## 2020-05-12 NOTE — Telephone Encounter (Signed)
Copied from [place] [id number]. Topic: General - Other >> May 11, 2020  2:21 PM Lennox Solders wrote: Reason for CRM: Pt was calling and would like dr Wynetta Emery to return his call concerning his medication. Pt was transferred to [facility] pharmacy to request refills and still insist dr Wynetta Emery to return his call

## 2020-05-12 NOTE — Telephone Encounter (Signed)
Lurena Joiner has sent in rxs for pt

## 2020-05-26 ENCOUNTER — Other Ambulatory Visit: Payer: Self-pay

## 2020-06-15 ENCOUNTER — Other Ambulatory Visit: Payer: Self-pay

## 2020-06-15 ENCOUNTER — Other Ambulatory Visit: Payer: Self-pay | Admitting: Family Medicine

## 2020-06-15 MED ORDER — AMLODIPINE BESYLATE 10 MG PO TABS
ORAL_TABLET | Freq: Every day | ORAL | 1 refills | Status: DC
  Filled 2020-06-15: qty 30, 30d supply, fill #0

## 2020-06-15 MED FILL — Atorvastatin Calcium Tab 40 MG (Base Equivalent): ORAL | 30 days supply | Qty: 30 | Fill #1 | Status: CN

## 2020-06-16 ENCOUNTER — Other Ambulatory Visit: Payer: Self-pay

## 2020-06-16 ENCOUNTER — Telehealth: Payer: Self-pay

## 2020-06-16 MED ORDER — INSULIN GLARGINE 100 UNIT/ML SOLOSTAR PEN
PEN_INJECTOR | SUBCUTANEOUS | 0 refills | Status: DC
  Filled 2020-06-16 – 2020-11-04 (×2): qty 9, 28d supply, fill #0

## 2020-06-16 MED ORDER — INSULIN GLARGINE 100 UNIT/ML SOLOSTAR PEN
32.0000 [IU] | PEN_INJECTOR | Freq: Every day | SUBCUTANEOUS | 2 refills | Status: DC
  Filled 2020-06-16 – 2020-07-23 (×3): qty 9, 28d supply, fill #0
  Filled 2020-07-23 (×3): qty 15, 46d supply, fill #0
  Filled 2020-09-06 – 2020-09-13 (×2): qty 15, 46d supply, fill #1
  Filled 2020-12-20: qty 9, 28d supply, fill #2

## 2020-06-16 NOTE — Telephone Encounter (Signed)
Hadassah Pais from the pharmacy came to me and stated that pt insurance will not cover basaglar and it will need to be changed to Lantus. Lurena Joiner was not in clinic. Shayla asked if I could send the rx I asked if it will be the same directions per [facility] it will be same directions it will just need to be changed to lantus.   I have sent new rx for lantus to the pharmacy for pt to pick up while in office getting other medications

## 2020-06-17 ENCOUNTER — Other Ambulatory Visit: Payer: Self-pay

## 2020-07-15 ENCOUNTER — Other Ambulatory Visit: Payer: Self-pay | Admitting: Internal Medicine

## 2020-07-15 DIAGNOSIS — E785 Hyperlipidemia, unspecified: Secondary | ICD-10-CM

## 2020-07-15 NOTE — Telephone Encounter (Signed)
Copied from [place] [id number]. Topic: Quick Communication - Rx Refill/Question >> Jul 15, 2020  2:11 PM Leward Quan A wrote: Medication: amLODipine (NORVASC) 10 MG tablet, atorvastatin (LIPITOR) 40 MG tablet   Has the patient contacted their pharmacy? Yes.   (Agent: If no, request that the patient contact the pharmacy for the refill.) (Agent: If yes, when and what did the pharmacy advise?)  Preferred Pharmacy (with phone number or street name): [facility] [place]  Phone:  [phone] Fax:  [phone]     Agent: Please be advised that RX refills may take up to 3 business days. We ask that you follow-up with your pharmacy.

## 2020-07-15 NOTE — Telephone Encounter (Signed)
Requested medication (s) are due for refill today: no  Requested medication (s) are on the active medication list: yes   Future visit scheduled: no  Notes to clinic:  review for refill  Script has expired    Requested Prescriptions  Pending Prescriptions Disp Refills   atorvastatin (LIPITOR) 40 MG tablet 90 tablet 1    Sig: Take 1 tablet (40 mg total) by mouth daily.      Cardiovascular:  Antilipid - Statins Failed - 07/15/2020  2:20 PM      Failed - Total Cholesterol in normal range and within 360 days    Cholesterol, Total  Date Value Ref Range Status  12/04/2019 212 (H) 100 - 199 mg/dL Final          Failed - LDL in normal range and within 360 days    LDL Chol Calc (NIH)  Date Value Ref Range Status  12/04/2019 143 (H) 0 - 99 mg/dL Final          Failed - HDL in normal range and within 360 days    HDL  Date Value Ref Range Status  12/04/2019 35 (L) >39 mg/dL Final          Failed - Triglycerides in normal range and within 360 days    Triglycerides  Date Value Ref Range Status  12/04/2019 187 (H) 0 - 149 mg/dL Final          Passed - Patient is not pregnant      Passed - Valid encounter within last 12 months    Recent Outpatient Visits           3 months ago Type 2 diabetes mellitus with obesity ([place])   [place] Joyce, Neoma Laming B, MD   7 months ago Type 2 diabetes mellitus without complication, with long-term current use of insulin ([place])   [place], Neoma Laming B, MD   1 year ago Type 2 diabetes mellitus without complication, with long-term current use of insulin ([place])   [place], Neoma Laming B, MD   1 year ago Uncontrolled type 2 diabetes mellitus with hyperglycemia [facility])   [place] Ausdall, Jarome Matin, RPH-CPP   1 year ago Uncontrolled type 2 diabetes mellitus with hyperglycemia [facility])   [place] Ausdall, Stephen L, RPH-CPP                  amLODipine (NORVASC) 10 MG tablet 30 tablet 2    Sig: Take 1 tablet (10 mg total) by mouth daily.      Cardiovascular:  Calcium Channel Blockers Passed - 07/15/2020  2:20 PM      Passed - Last BP in normal range    BP Readings from Last 1 Encounters:  01/04/20 138/66          Passed - Valid encounter within last 6 months    Recent Outpatient Visits           3 months ago Type 2 diabetes mellitus with obesity ([place])   [place] Maynard, Neoma Laming B, MD   7 months ago Type 2 diabetes mellitus without complication, with long-term current use of insulin [facility])   [place] Karle Plumber B, MD   1 year ago Type 2 diabetes mellitus without complication, with long-term current use of insulin ([place])  [place] Ladell [place], MD   1 year ago Uncontrolled type 2 diabetes mellitus with hyperglycemia [facility])   [place], Jarome Matin, RPH-CPP   1 year ago Uncontrolled type 2 diabetes mellitus with hyperglycemia [facility])   [place], RPH-CPP

## 2020-07-16 ENCOUNTER — Other Ambulatory Visit: Payer: Self-pay

## 2020-07-16 MED ORDER — AMLODIPINE BESYLATE 10 MG PO TABS
10.0000 mg | ORAL_TABLET | Freq: Every day | ORAL | 2 refills | Status: DC
  Filled 2020-07-16 – 2020-07-23 (×3): qty 30, 30d supply, fill #0
  Filled 2020-09-06 (×2): qty 30, 30d supply, fill #1

## 2020-07-16 MED ORDER — ATORVASTATIN CALCIUM 40 MG PO TABS
40.0000 mg | ORAL_TABLET | Freq: Every day | ORAL | 2 refills | Status: DC
  Filled 2020-07-16 – 2020-07-23 (×3): qty 30, 30d supply, fill #0
  Filled 2020-09-06: qty 30, 30d supply, fill #1

## 2020-07-22 ENCOUNTER — Other Ambulatory Visit: Payer: Self-pay

## 2020-07-23 ENCOUNTER — Other Ambulatory Visit: Payer: Self-pay

## 2020-07-26 ENCOUNTER — Other Ambulatory Visit: Payer: Self-pay

## 2020-09-06 ENCOUNTER — Other Ambulatory Visit: Payer: Self-pay

## 2020-09-13 ENCOUNTER — Other Ambulatory Visit: Payer: Self-pay

## 2020-09-14 ENCOUNTER — Other Ambulatory Visit: Payer: Self-pay

## 2020-10-13 ENCOUNTER — Other Ambulatory Visit: Payer: Self-pay

## 2020-10-13 ENCOUNTER — Other Ambulatory Visit: Payer: Self-pay | Admitting: Internal Medicine

## 2020-10-13 DIAGNOSIS — E785 Hyperlipidemia, unspecified: Secondary | ICD-10-CM

## 2020-10-13 MED ORDER — AMLODIPINE BESYLATE 10 MG PO TABS
10.0000 mg | ORAL_TABLET | Freq: Every day | ORAL | 1 refills | Status: DC
  Filled 2020-10-13: qty 30, 30d supply, fill #0
  Filled 2020-12-02 – 2020-12-20 (×2): qty 30, 30d supply, fill #1

## 2020-10-13 MED ORDER — ATORVASTATIN CALCIUM 40 MG PO TABS
40.0000 mg | ORAL_TABLET | Freq: Every day | ORAL | 1 refills | Status: DC
  Filled 2020-10-13: qty 30, 30d supply, fill #0
  Filled 2020-12-02 – 2020-12-20 (×2): qty 30, 30d supply, fill #1

## 2020-10-13 NOTE — Telephone Encounter (Signed)
Medication Refill - Medication:  atorvastatin (LIPITOR) 40 MG tablet amLODipine (NORVASC) 10 MG tablet  Has the patient contacted their pharmacy? No.  Preferred Pharmacy (with phone number or street name):  [facility] [place]  Phone:  [phone] Fax:  [phone]  Agent: Please be advised that RX refills may take up to 3 business days. We ask that you follow-up with your pharmacy.

## 2020-10-13 NOTE — Telephone Encounter (Signed)
Requested medication (s) are due for refill today:yes  Due 10/16/20  Requested medication (s) are on the active medication list: yes  Last refill: 07/16/20  #30  2 refills  Both meds  Future visit scheduled no  Notes to clinic:  Pt was to have F/U appt 6/22 or 7/22. Attempted to reach to schedule, "VM not set up yet." Please review. Thank you  Requested Prescriptions  Pending Prescriptions Disp Refills   amLODipine (NORVASC) 10 MG tablet 30 tablet 2    Sig: Take 1 tablet (10 mg total) by mouth daily.     Cardiovascular:  Calcium Channel Blockers Failed - 10/13/2020  3:37 PM      Failed - Valid encounter within last 6 months    Recent Outpatient Visits           6 months ago Type 2 diabetes mellitus with obesity [facility])   [place], Neoma Laming B, MD   10 months ago Type 2 diabetes mellitus without complication, with long-term current use of insulin [facility])   [place], Deborah B, MD   1 year ago Type 2 diabetes mellitus without complication, with long-term current use of insulin [facility])   [place], Deborah B, MD   1 year ago Uncontrolled type 2 diabetes mellitus with hyperglycemia [facility])   [place], Stephen L, RPH-CPP   1 year ago Uncontrolled type 2 diabetes mellitus with hyperglycemia [facility])   [place], Annie Main L, RPH-CPP              Passed - Last BP in normal range    BP Readings from Last 1 Encounters:  01/04/20 138/66           atorvastatin (LIPITOR) 40 MG tablet 30 tablet 2    Sig: Take 1 tablet (40 mg total) by mouth daily.     Cardiovascular:  Antilipid - Statins Failed - 10/13/2020  3:37 PM      Failed - Total Cholesterol in normal range and within 360 days    Cholesterol, Total  Date Value Ref Range Status  12/04/2019 212 (H) 100 - 199 mg/dL Final           Failed - LDL in normal range and within 360 days    LDL Chol Calc (NIH)  Date Value Ref Range Status  12/04/2019 143 (H) 0 - 99 mg/dL Final          Failed - HDL in normal range and within 360 days    HDL  Date Value Ref Range Status  12/04/2019 35 (L) >39 mg/dL Final          Failed - Triglycerides in normal range and within 360 days    Triglycerides  Date Value Ref Range Status  12/04/2019 187 (H) 0 - 149 mg/dL Final          Passed - Patient is not pregnant      Passed - Valid encounter within last 12 months    Recent Outpatient Visits           6 months ago Type 2 diabetes mellitus with obesity ([place])   [place], Neoma Laming B, MD   10 months ago Type 2 diabetes mellitus without complication, with long-term current use of insulin [facility])   [place], Neoma Laming  B, MD   1 year ago Type 2 diabetes mellitus without complication, with long-term current use of insulin ([place])   [place], Deborah B, MD   1 year ago Uncontrolled type 2 diabetes mellitus with hyperglycemia [facility])   [place], Jarome Matin, RPH-CPP   1 year ago Uncontrolled type 2 diabetes mellitus with hyperglycemia [facility])   [place], RPH-CPP

## 2020-10-14 ENCOUNTER — Other Ambulatory Visit: Payer: Self-pay

## 2020-10-20 ENCOUNTER — Other Ambulatory Visit: Payer: Self-pay

## 2020-11-04 ENCOUNTER — Other Ambulatory Visit: Payer: Self-pay

## 2020-12-02 ENCOUNTER — Emergency Department (HOSPITAL_COMMUNITY)
Admission: EM | Admit: 2020-12-02 | Discharge: 2020-12-02 | Disposition: A | Discharge status: Home / Self Care | Payer: No Typology Code available for payment source | Attending: Emergency Medicine | Admitting: Emergency Medicine

## 2020-12-02 ENCOUNTER — Other Ambulatory Visit: Payer: Self-pay

## 2020-12-02 ENCOUNTER — Encounter (HOSPITAL_COMMUNITY): Payer: Self-pay

## 2020-12-02 ENCOUNTER — Emergency Department (HOSPITAL_COMMUNITY): Payer: No Typology Code available for payment source

## 2020-12-02 DIAGNOSIS — M25562 Pain in left knee: Secondary | ICD-10-CM | POA: Diagnosis not present

## 2020-12-02 DIAGNOSIS — Y9272 Chicken coop as the place of occurrence of the external cause: Secondary | ICD-10-CM | POA: Diagnosis not present

## 2020-12-02 DIAGNOSIS — Z79899 Other long term (current) drug therapy: Secondary | ICD-10-CM | POA: Insufficient documentation

## 2020-12-02 DIAGNOSIS — M545 Low back pain, unspecified: Secondary | ICD-10-CM | POA: Diagnosis present

## 2020-12-02 DIAGNOSIS — Y990 Civilian activity done for income or pay: Secondary | ICD-10-CM | POA: Insufficient documentation

## 2020-12-02 DIAGNOSIS — Z794 Long term (current) use of insulin: Secondary | ICD-10-CM | POA: Diagnosis not present

## 2020-12-02 DIAGNOSIS — I10 Essential (primary) hypertension: Secondary | ICD-10-CM | POA: Insufficient documentation

## 2020-12-02 DIAGNOSIS — F17210 Nicotine dependence, cigarettes, uncomplicated: Secondary | ICD-10-CM | POA: Diagnosis not present

## 2020-12-02 DIAGNOSIS — W240XXA Contact with lifting devices, not elsewhere classified, initial encounter: Secondary | ICD-10-CM | POA: Insufficient documentation

## 2020-12-02 DIAGNOSIS — M79601 Pain in right arm: Secondary | ICD-10-CM | POA: Diagnosis not present

## 2020-12-02 DIAGNOSIS — E119 Type 2 diabetes mellitus without complications: Secondary | ICD-10-CM | POA: Insufficient documentation

## 2020-12-02 MED ORDER — DICLOFENAC SODIUM 1 % EX GEL
2.0000 g | Freq: Four times a day (QID) | CUTANEOUS | 0 refills | Status: DC
  Filled 2020-12-02: qty 100, 12d supply, fill #0

## 2020-12-02 MED ORDER — HYDROCODONE-ACETAMINOPHEN 5-325 MG PO TABS
1.0000 | ORAL_TABLET | Freq: Once | ORAL | Status: AC
  Administered 2020-12-02: 1 via ORAL
  Filled 2020-12-02: qty 1

## 2020-12-02 NOTE — ED Provider Notes (Signed)
[facility] DEPT Provider Note   CSN: [id number] Arrival date & time: 12/02/20  1011     History Chief Complaint  Patient presents with   Back Pain    [name] is a 50 y.o. male.  With past medical history as described below who presents to the emergency department with back, knee and arm pain.  He states last night at 1:30 PM he was at work when there was a work accident.  He states that he works at a [employer] and works Buyer, retail.  States that a coworker was driving a forklift when he accidentally hit the conveyor belt.  States that this knocked him backwards and he fell on his back, left knee and right hand/wrist.  He states that he went to the work health department however they were unable to do imaging there.  He states last night the pain has increased until today when he presented to the emergency department.  He denies hitting his head or loss of consciousness.   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 ([place]) 01/14/2019   Hyponatremia 01/14/2019   Uncontrolled type 2 diabetes mellitus with hyperglycemia ([place]) 01/14/2019   Medically noncompliant 03/28/2018   Tobacco dependence 03/28/2018   Gastroesophageal reflux disease without esophagitis 03/28/2018   Hyperlipidemia with target LDL less than 100 03/28/2018   Dental cavities 11/23/2017   Essential hypertension 11/23/2017   New daily persistent headache 11/23/2017   Scalp lump 08/25/2013    Past Surgical History:   Procedure Laterality Date   DG 3RD DIGIT RIGHT HAND     DG 4TH DIGIT RIGHT HAND         Family History  Problem Relation Age of Onset   Stroke Father    Hypertension Father     Social History   Tobacco Use   Smoking status: Every Day    Packs/day: 0.50    Types: Cigarettes   Smokeless tobacco: Never  Vaping Use   Vaping Use: Never used  Substance Use Topics   Alcohol use: Yes    Alcohol/week: 12.0 standard drinks    Types: 12 Cans of beer per week   Drug use: No    Home Medications Prior to Admission medications   Medication Sig Start Date End Date Taking? Authorizing Provider  amLODipine (NORVASC) 10 MG tablet Take 1 tablet (10 mg total) by mouth daily. 10/13/20   Ladell Pier, MD  amoxicillin (AMOXIL) 500 MG capsule TAKE 1 CAPSULE (500 MG TOTAL) BY MOUTH 3 (THREE) TIMES DAILY. 12/04/19 12/03/20  Ladell Pier, MD  atorvastatin (LIPITOR) 40 MG tablet Take 1 tablet (40 mg total) by mouth daily. 10/13/20   Ladell Pier, MD  Blood Glucose Monitoring Suppl (TRUE METRIX METER) w/Device KIT Use as instructed to check blood sugar 3 times daily. 01/14/19   Ladell Pier, MD  fenofibrate (TRICOR) 145 MG tablet TAKE 1 TABLET (145 MG TOTAL) BY MOUTH DAILY. 02/03/20   Ladell Pier, MD  fenofibrate (TRICOR) 145  MG tablet TAKE 1 TABLET (145 MG TOTAL) BY MOUTH DAILY. 10/10/19 10/09/20  Ladell Pier, MD  glucose blood test strip Use as instructed to check blood sugar 3 times daily. 01/14/19   Ladell Pier, MD  insulin glargine (LANTUS) 100 UNIT/ML Solostar Pen Inject 32 into the skin. 06/16/20   Charlott Rakes, MD  insulin glargine (LANTUS) 100 UNIT/ML Solostar Pen Inject 32 Units into the skin daily. 06/16/20   Ladell Pier, MD  insulin glargine (LANTUS) 100 UNIT/ML Solostar Pen INJECT 32 UNITS INTO THE SKIN DAILY. 08/07/19 08/06/20  Ladell Pier, MD  Insulin Pen Needle 29G X 12MM MISC Use with insulin pen 10/10/19   Ladell Pier, MD   omeprazole (PRILOSEC) 40 MG capsule Take 1 capsule (40 mg total) by mouth daily. 10/10/19   Ladell Pier, MD  omeprazole (PRILOSEC) 40 MG capsule TAKE 1 CAPSULE (40 MG TOTAL) BY MOUTH DAILY. 10/10/19 10/09/20  Ladell Pier, MD  omeprazole (PRILOSEC) 40 MG capsule TAKE 1 CAPSULE (40 MG TOTAL) BY MOUTH DAILY. 04/29/19 04/28/20  Ladell Pier, MD  TRUEplus Lancets 28G MISC Use as instructed to check blood sugar 3 times daily. 01/14/19   Ladell Pier, MD    Allergies    Patient has no known allergies.  Review of Systems   Review of Systems  Musculoskeletal:  Positive for arthralgias and back pain.  All other systems reviewed and are negative.  Physical Exam Updated Vital Signs BP (!) 147/100 (BP Location: Right Arm)   Pulse 88   Temp 98 F (36.7 C) (Oral)   Resp 18   SpO2 99%   Physical Exam Vitals and nursing note reviewed.  HENT:     Head: Normocephalic and atraumatic.  Eyes:     General: No scleral icterus. Cardiovascular:     Pulses: Normal pulses.  Pulmonary:     Effort: Pulmonary effort is normal. No respiratory distress.  Musculoskeletal:     Right wrist: Tenderness present. Decreased range of motion. Normal pulse.       Arms:     Cervical back: Normal range of motion and neck supple. No tenderness.     Lumbar back: Tenderness and bony tenderness present.       Back:     Right knee: Normal.     Left knee: Bony tenderness present. Normal range of motion. Tenderness present.     Comments: Full range of motion of the left knee with pain.  There is no obvious effusion or deformity on physical exam.  Skin:    General: Skin is warm and dry.     Capillary Refill: Capillary refill takes less than 2 seconds.     Findings: No bruising or rash.  Neurological:     General: No focal deficit present.     Mental Status: He is alert and oriented to person, place, and time. Mental status is at baseline.  Psychiatric:        Mood and Affect: Mood normal.         Behavior: Behavior normal.        Thought Content: Thought content normal.        Judgment: Judgment normal.    ED Results / Procedures / Treatments   Labs (all labs ordered are listed, but only abnormal results are displayed) Labs Reviewed - No data to display  EKG None  Radiology DG Lumbar Spine Complete  Result Date: 12/02/2020 CLINICAL DATA:  fall EXAM: LUMBAR SPINE -  COMPLETE 4+ VIEW COMPARISON:  None. FINDINGS: There are 5 non-rib-bearing lumbar type vertebral bodies. Normal alignment. The lumbar vertebral body heights are maintained. There is multilevel degenerative disc disease with bridging osteophytes, with most advanced and mild to moderate disc space height loss at L4-L5 and L5-S1. No acute fracture. Normal mineralization. The soft tissues are unremarkable. IMPRESSION: No malalignment or acute fracture in the lumbar spine. Multilevel degenerative changes as described. Electronically Signed   By: Albin Felling M.D.   On: 12/02/2020 13:28   DG Knee Complete 4 Views Left  Result Date: 12/02/2020 CLINICAL DATA:  fall EXAM: LEFT KNEE - COMPLETE 4+ VIEW COMPARISON:  None. FINDINGS: Normal alignment. No acute fracture. Normal mineralization. The soft tissues are unremarkable. No joint effusion. IMPRESSION: No malalignment or acute fracture. Electronically Signed   By: Albin Felling M.D.   On: 12/02/2020 13:29   DG Hand Complete Right  Result Date: 12/02/2020 CLINICAL DATA:  Lateral LEFT knee pain and RIGHT hand pain post fall EXAM: RIGHT HAND - COMPLETE 3+ VIEW COMPARISON:  RIGHT wrist radiographs 10/04/2017 FINDINGS: Osseous mineralization normal. Joint spaces preserved. Dorsal soft tissue swelling overlying the MCP joints. No acute fracture, dislocation, or bone destruction. IMPRESSION: No acute osseous abnormalities. Electronically Signed   By: Lavonia Dana M.D.   On: 12/02/2020 13:25    Procedures Procedures   Medications Ordered in ED Medications  HYDROcodone-acetaminophen  (NORCO/VICODIN) 5-325 MG per tablet 1 tablet (has no administration in time range)    ED Course  I have reviewed the triage vital signs and the nursing notes.  Pertinent labs & imaging results that were available during my care of the patient were reviewed by me and considered in my medical decision making (see chart for details).    MDM Rules/Calculators/A&P 50 year old male who presents emergency department after accident at work.  Imaging of the right hand, left knee and lumbar spine are all negative for acute fractures or dislocations.  His physical exam is unremarkable.  He has no back pain red flags.  Given 1 dose of Norco here with improvement in symptoms.  Discussed findings with him and that he can take Tylenol and ibuprofen as needed over the next few days.  He may be sore over the next few days.  I have given that he can return on Monday.  Additionally have prescribed him Voltaren gel for his knee.  He is instructed return to emergency department should he have increasing weakness in his lower extremities, saddle anesthesia or fevers.  He is agreeable with the plan.  And safe for discharge. Final Clinical Impression(s) / ED Diagnoses Final diagnoses:  Acute left-sided low back pain without sciatica    Rx / DC Orders ED Discharge Orders          Ordered    diclofenac Sodium (VOLTAREN) 1 % GEL  4 times daily        12/02/20 1349             Mickie Hillier, PA-C 12/02/20 1555    Daleen Bo, MD 12/02/20 1815

## 2020-12-02 NOTE — ED Triage Notes (Signed)
Pt presents with c/o back pain, right wrist pain, and left knee pain after a work injury that occurred last night. Pt reports a forklift knocked over a bucket that caused his conveyer belt to bump into him.

## 2020-12-02 NOTE — ED Notes (Signed)
An After Visit Summary was printed and given to the patient. Discharge instructions given and no further questions at this time.  

## 2020-12-02 NOTE — Discharge Instructions (Addendum)
You were seen in the emergency department today for pain in the back knee and hand after falling at work.  We did x-rays while you are here which were all negative.  Over the next few days you may be sore.  It would be helpful to use Tylenol and ibuprofen as needed for pain.  Please return to the emergency department severely increased back pain or weakness in your legs, numbness in your groin or inability to control your bowel or bladder.

## 2020-12-07 ENCOUNTER — Telehealth: Admitting: Internal Medicine

## 2020-12-07 ENCOUNTER — Ambulatory Visit: Admitting: Internal Medicine

## 2020-12-09 ENCOUNTER — Other Ambulatory Visit: Payer: Self-pay

## 2020-12-20 ENCOUNTER — Other Ambulatory Visit: Payer: Self-pay

## 2021-02-10 ENCOUNTER — Other Ambulatory Visit: Payer: Self-pay | Admitting: Pharmacist

## 2021-02-10 ENCOUNTER — Other Ambulatory Visit: Payer: Self-pay

## 2021-02-10 ENCOUNTER — Telehealth: Payer: Self-pay | Admitting: Internal Medicine

## 2021-02-10 ENCOUNTER — Ambulatory Visit: Payer: Self-pay

## 2021-02-10 MED ORDER — INSULIN GLARGINE 100 UNIT/ML SOLOSTAR PEN
PEN_INJECTOR | SUBCUTANEOUS | 0 refills | Status: DC
  Filled 2021-02-10: qty 9, 28d supply, fill #0

## 2021-02-10 NOTE — Telephone Encounter (Signed)
Copied from [place] [id number]. Topic: General - Other >> Feb 10, 2021  1:34 PM Leward Quan A wrote: Reason for CRM: Patient and wife Ellison Hughs called in asking to speak to [facility] say that it is an emergency concerning medication patient have not had in 3 weeks. Please call patient at Ph# [phone]

## 2021-02-10 NOTE — Telephone Encounter (Signed)
Returned call. Rx was sent in today and will be picked up tomorrow.

## 2021-02-10 NOTE — Telephone Encounter (Signed)
°  Chief Complaint: med refill Symptoms: tingling/numbness in fingers and toes Frequency: 2-3 weeks Pertinent Negatives: NA Disposition: [] ED /[] Urgent Care (no appt availability in office) / [] Appointment(In office/virtual)/ []  [facility]/ [] Home Care/ [] Refused Recommended Disposition /[] [facility]/ [x]  Follow-up with PCP Additional Notes: Pt's wife calling in stating pt has been out of [place] for 3 weeks d/t being out of work but pt has went back now and needed to get it refilled She states that pt gets paycheck next week and they normally speak with Lurena Joiner, [facility] about helping out with refills. Attempted to call pharmacy multiple times and unable to speak with anyone. Pt's wife was provided with pharmacy number and email and also advised her I will send this to provider and see if she can go ahead and refill this for pt or help out in any way with cost. She verbalized understanding.    Reason for Disposition  [1] Prescription refill request for ESSENTIAL medicine (i.e., likelihood of harm to patient if not taken) AND [2] triager unable to refill per department policy  Answer Assessment - Initial Assessment Questions 1. DRUG NAME: "What medicine do you need to have refilled?"     Lantus 2. REFILLS REMAINING: "How many refills are remaining?" (Note: The label on the medicine or pill bottle will show how many refills are remaining. If there are no refills remaining, then a renewal may be needed.)     0 refills, been out for 3 weeks 4. PRESCRIBING HCP: "Who prescribed it?" Reason: If prescribed by specialist, call should be referred to that group.     [place] 5. SYMPTOMS: "Do you have any symptoms?"     Tingling/ numbness in toes and fingers  Protocols used: Medication Refill and Renewal Call-A-AH

## 2021-02-10 NOTE — Telephone Encounter (Signed)
Rx sent to pharmacy   

## 2021-02-11 ENCOUNTER — Other Ambulatory Visit: Payer: Self-pay

## 2021-02-17 ENCOUNTER — Other Ambulatory Visit: Payer: Self-pay

## 2021-02-17 ENCOUNTER — Other Ambulatory Visit: Payer: Self-pay | Admitting: Pharmacist

## 2021-02-17 ENCOUNTER — Other Ambulatory Visit: Payer: Self-pay | Admitting: Internal Medicine

## 2021-02-17 DIAGNOSIS — E785 Hyperlipidemia, unspecified: Secondary | ICD-10-CM

## 2021-02-17 MED ORDER — ATORVASTATIN CALCIUM 40 MG PO TABS
40.0000 mg | ORAL_TABLET | Freq: Every day | ORAL | 0 refills | Status: DC
  Filled 2021-02-17: qty 30, 30d supply, fill #0

## 2021-02-17 MED ORDER — AMLODIPINE BESYLATE 10 MG PO TABS
10.0000 mg | ORAL_TABLET | Freq: Every day | ORAL | 0 refills | Status: DC
  Filled 2021-02-17: qty 30, 30d supply, fill #0

## 2021-02-17 NOTE — Telephone Encounter (Signed)
Requested medication (s) are due for refill today: Yes  Requested medication (s) are on the active medication list: Yes  Last refill:  10/13/20  Future visit scheduled: Yes  Notes to clinic:  Protocol indicates lab work is needed.    Requested Prescriptions  Pending Prescriptions Disp Refills   atorvastatin (LIPITOR) 40 MG tablet 30 tablet 1    Sig: Take 1 tablet (40 mg total) by mouth daily.     Cardiovascular:  Antilipid - Statins Failed - 02/17/2021 10:38 AM      Failed - Total Cholesterol in normal range and within 360 days    Cholesterol, Total  Date Value Ref Range Status  12/04/2019 212 (H) 100 - 199 mg/dL Final          Failed - LDL in normal range and within 360 days    LDL Chol Calc (NIH)  Date Value Ref Range Status  12/04/2019 143 (H) 0 - 99 mg/dL Final          Failed - HDL in normal range and within 360 days    HDL  Date Value Ref Range Status  12/04/2019 35 (L) >39 mg/dL Final          Failed - Triglycerides in normal range and within 360 days    Triglycerides  Date Value Ref Range Status  12/04/2019 187 (H) 0 - 149 mg/dL Final          Passed - Patient is not pregnant      Passed - Valid encounter within last 12 months    Recent Outpatient Visits           10 months ago Type 2 diabetes mellitus with obesity ([place])   [place], Neoma Laming B, MD   1 year ago Type 2 diabetes mellitus without complication, with long-term current use of insulin ([place])   [place], Deborah B, MD   1 year ago Type 2 diabetes mellitus without complication, with long-term current use of insulin ([place])   [place], Deborah B, MD   1 year ago Uncontrolled type 2 diabetes mellitus with hyperglycemia [facility])   [place], Jarome Matin, RPH-CPP   2 years ago Uncontrolled type 2 diabetes mellitus with hyperglycemia [facility])    [place], Jarome Matin, RPH-CPP       Future Appointments             In 1 week Joya Gaskins Burnett Harry, MD [place]

## 2021-02-18 ENCOUNTER — Other Ambulatory Visit: Payer: Self-pay

## 2021-02-25 ENCOUNTER — Other Ambulatory Visit: Payer: Self-pay

## 2021-02-28 NOTE — Progress Notes (Deleted)
Established Patient Office Visit  Subjective:  Patient ID: [name], male    DOB: [birth year]  Age: 51 y.o. MRN: [id number]  CC: No chief complaint on file.   HPI [name] presents for  Hiv hcv eye foot flu a1c urine alb colon   last ov 03/3020 telehealth OV    Past Medical History:  Diagnosis Date   Renal disorder     Past Surgical History:  Procedure Laterality Date   DG 3RD DIGIT RIGHT HAND     DG 4TH DIGIT RIGHT HAND      Family History  Problem Relation Age of Onset   Stroke Father    Hypertension Father     Social History   Socioeconomic History   Marital status: Married    Spouse name: Not on file   Number of children: Not on file   Years of education: Not on file   Highest education level: Not on file  Occupational History   Not on file  Tobacco Use   Smoking status: Every Day    Packs/day: 0.50    Types: Cigarettes   Smokeless tobacco: Never  Vaping Use   Vaping Use: Never used  Substance and Sexual Activity   Alcohol use: Yes    Alcohol/week: 12.0 standard drinks    Types: 12 Cans of beer per week   Drug use: No   Sexual activity: Not Currently  Other Topics Concern   Not on file  Social History Narrative   Not on file   Social Determinants of Health   Financial Resource Strain: Not on file  Food Insecurity: Not on file  Transportation Needs: Not on file  Physical Activity: Not on file  Stress: Not on file  Social Connections: Not on file  Intimate Partner Violence: Not on file    Outpatient Medications Prior to Visit  Medication Sig Dispense Refill   amLODipine (NORVASC) 10 MG tablet Take 1 tablet (10 mg total) by mouth daily. 30 tablet 0   atorvastatin (LIPITOR) 40 MG tablet Take 1 tablet (40 mg total) by mouth daily. 30 tablet 0   Blood Glucose Monitoring Suppl (TRUE METRIX METER) w/Device KIT Use as instructed to check blood sugar 3 times daily. 1 kit 0   diclofenac Sodium (VOLTAREN) 1 % GEL Apply 2 g topically 4 (four)  times daily. Apply to knee as needed for pain 100 g 0   fenofibrate (TRICOR) 145 MG tablet TAKE 1 TABLET (145 MG TOTAL) BY MOUTH DAILY. 30 tablet 2   fenofibrate (TRICOR) 145 MG tablet TAKE 1 TABLET (145 MG TOTAL) BY MOUTH DAILY. 30 tablet 2   glucose blood test strip Use as instructed to check blood sugar 3 times daily. 100 each 11   insulin glargine (LANTUS) 100 UNIT/ML Solostar Pen Inject 32 into the skin. 9 mL 0   Insulin Pen Needle 29G X 12MM MISC Use with insulin pen 100 each 0   omeprazole (PRILOSEC) 40 MG capsule Take 1 capsule (40 mg total) by mouth daily. 30 capsule 2   omeprazole (PRILOSEC) 40 MG capsule TAKE 1 CAPSULE (40 MG TOTAL) BY MOUTH DAILY. 30 capsule 2   omeprazole (PRILOSEC) 40 MG capsule TAKE 1 CAPSULE (40 MG TOTAL) BY MOUTH DAILY. 30 capsule 5   TRUEplus Lancets 28G MISC Use as instructed to check blood sugar 3 times daily. 100 each 11   No facility-administered medications prior to visit.    No Known Allergies  ROS Review of  ° °Established Patient Office Visit ° °Subjective:  °Patient ID: [name], male    DOB: 10/12/1970  Age: 51 y.o. MRN: [id number] ° °CC: No chief complaint on file. ° ° °HPI °[name] presents for  °Hiv hcv eye foot flu a1c urine alb colon   last ov 03/3020 telehealth OV ° ° ° °Past Medical History:  °Diagnosis Date  ° Renal disorder   ° ° °Past Surgical History:  °Procedure Laterality Date  ° DG 3RD DIGIT RIGHT HAND    ° DG 4TH DIGIT RIGHT HAND    ° ° °Family History  °Problem Relation Age of Onset  ° Stroke Father   ° Hypertension Father   ° ° °Social History  ° °Socioeconomic History  ° Marital status: Married  °  Spouse name: Not on file  ° Number of children: Not on file  ° Years of education: Not on file  ° Highest education level: Not on file  °Occupational History  ° Not on file  °Tobacco Use  ° Smoking status: Every Day  °  Packs/day: 0.50  °  Types: Cigarettes  ° Smokeless tobacco: Never  °Vaping Use  ° Vaping Use: Never used  °Substance and Sexual Activity  ° Alcohol use: Yes  °  Alcohol/week: 12.0 standard drinks  °  Types: 12 Cans of beer per week  ° Drug use: No  ° Sexual activity: Not Currently  °Other Topics Concern  ° Not on file  °Social History Narrative  ° Not on file  ° °Social Determinants of Health  ° °Financial Resource Strain: Not on file  °Food Insecurity: Not on file  °Transportation Needs: Not on file  °Physical Activity: Not on file  °Stress: Not on file  °Social Connections: Not on file  °Intimate Partner Violence: Not on file  ° ° °Outpatient Medications Prior to Visit  °Medication Sig Dispense Refill  ° amLODipine (NORVASC) 10 MG tablet Take 1 tablet (10 mg total) by mouth daily. 30 tablet 0  ° atorvastatin (LIPITOR) 40 MG tablet Take 1 tablet (40 mg total) by mouth daily. 30 tablet 0  ° Blood Glucose Monitoring Suppl (TRUE METRIX METER) w/Device KIT Use as instructed to check blood sugar 3 times daily. 1 kit 0  ° diclofenac Sodium (VOLTAREN) 1 % GEL Apply 2 g topically 4 (four)  times daily. Apply to knee as needed for pain 100 g 0  ° fenofibrate (TRICOR) 145 MG tablet TAKE 1 TABLET (145 MG TOTAL) BY MOUTH DAILY. 30 tablet 2  ° fenofibrate (TRICOR) 145 MG tablet TAKE 1 TABLET (145 MG TOTAL) BY MOUTH DAILY. 30 tablet 2  ° glucose blood test strip Use as instructed to check blood sugar 3 times daily. 100 each 11  ° insulin glargine (LANTUS) 100 UNIT/ML Solostar Pen Inject 32 into the skin. 9 mL 0  ° Insulin Pen Needle 29G X 12MM MISC Use with insulin pen 100 each 0  ° omeprazole (PRILOSEC) 40 MG capsule Take 1 capsule (40 mg total) by mouth daily. 30 capsule 2  ° omeprazole (PRILOSEC) 40 MG capsule TAKE 1 CAPSULE (40 MG TOTAL) BY MOUTH DAILY. 30 capsule 2  ° omeprazole (PRILOSEC) 40 MG capsule TAKE 1 CAPSULE (40 MG TOTAL) BY MOUTH DAILY. 30 capsule 5  ° TRUEplus Lancets 28G MISC Use as instructed to check blood sugar 3 times daily. 100 each 11  ° °No facility-administered medications prior to visit.  ° ° °No Known Allergies ° °ROS °Review of Systems ° °  °

## 2021-03-01 ENCOUNTER — Ambulatory Visit: Payer: Self-pay | Attending: Critical Care Medicine | Admitting: Critical Care Medicine

## 2021-03-01 ENCOUNTER — Telehealth: Payer: Self-pay | Admitting: Critical Care Medicine

## 2021-03-01 ENCOUNTER — Encounter: Payer: Self-pay | Admitting: Critical Care Medicine

## 2021-03-01 ENCOUNTER — Other Ambulatory Visit: Payer: Self-pay

## 2021-03-01 VITALS — BP 141/95 | HR 86 | Resp 16 | Wt 241.8 lb

## 2021-03-01 DIAGNOSIS — E785 Hyperlipidemia, unspecified: Secondary | ICD-10-CM

## 2021-03-01 DIAGNOSIS — E1169 Type 2 diabetes mellitus with other specified complication: Secondary | ICD-10-CM

## 2021-03-01 DIAGNOSIS — Z1211 Encounter for screening for malignant neoplasm of colon: Secondary | ICD-10-CM

## 2021-03-01 DIAGNOSIS — Z794 Long term (current) use of insulin: Secondary | ICD-10-CM

## 2021-03-01 DIAGNOSIS — F17200 Nicotine dependence, unspecified, uncomplicated: Secondary | ICD-10-CM

## 2021-03-01 DIAGNOSIS — E66812 Obesity, class 2: Secondary | ICD-10-CM

## 2021-03-01 DIAGNOSIS — F1090 Alcohol use, unspecified, uncomplicated: Secondary | ICD-10-CM

## 2021-03-01 DIAGNOSIS — Z789 Other specified health status: Secondary | ICD-10-CM

## 2021-03-01 DIAGNOSIS — Z1159 Encounter for screening for other viral diseases: Secondary | ICD-10-CM

## 2021-03-01 DIAGNOSIS — K219 Gastro-esophageal reflux disease without esophagitis: Secondary | ICD-10-CM

## 2021-03-01 DIAGNOSIS — I10 Essential (primary) hypertension: Secondary | ICD-10-CM

## 2021-03-01 DIAGNOSIS — Z114 Encounter for screening for human immunodeficiency virus [HIV]: Secondary | ICD-10-CM

## 2021-03-01 DIAGNOSIS — K860 Alcohol-induced chronic pancreatitis: Secondary | ICD-10-CM

## 2021-03-01 DIAGNOSIS — Z139 Encounter for screening, unspecified: Secondary | ICD-10-CM

## 2021-03-01 DIAGNOSIS — E11630 Type 2 diabetes mellitus with periodontal disease: Secondary | ICD-10-CM

## 2021-03-01 DIAGNOSIS — K5900 Constipation, unspecified: Secondary | ICD-10-CM

## 2021-03-01 DIAGNOSIS — E1165 Type 2 diabetes mellitus with hyperglycemia: Secondary | ICD-10-CM

## 2021-03-01 LAB — POCT GLYCOSYLATED HEMOGLOBIN (HGB A1C): HbA1c, POC (controlled diabetic range): 6.6 % (ref 0.0–7.0)

## 2021-03-01 LAB — GLUCOSE, POCT (MANUAL RESULT ENTRY): POC Glucose: 143 mg/dl — AB (ref 70–99)

## 2021-03-01 MED ORDER — OMEPRAZOLE 40 MG PO CPDR
DELAYED_RELEASE_CAPSULE | Freq: Every day | ORAL | 5 refills | Status: DC
  Filled 2021-03-01 – 2021-07-19 (×2): qty 30, 30d supply, fill #0
  Filled 2021-08-17: qty 30, 30d supply, fill #1
  Filled 2021-09-22: qty 30, 30d supply, fill #2
  Filled 2021-10-31: qty 30, 30d supply, fill #3
  Filled 2021-12-13: qty 30, 30d supply, fill #4
  Filled 2022-01-11 (×2): qty 30, 30d supply, fill #5

## 2021-03-01 MED ORDER — NICOTINE POLACRILEX 4 MG MT LOZG
LOZENGE | OROMUCOSAL | 4 refills | Status: DC
  Filled 2021-03-01: qty 100, 33d supply, fill #0

## 2021-03-01 MED ORDER — INSULIN PEN NEEDLE 29G X 12.7MM MISC
2 refills | Status: AC
  Filled 2021-03-01: qty 100, 33d supply, fill #0

## 2021-03-01 MED ORDER — FENOFIBRATE 145 MG PO TABS
ORAL_TABLET | Freq: Every day | ORAL | 2 refills | Status: DC
  Filled 2021-03-01: qty 30, 30d supply, fill #0
  Filled 2021-06-07: qty 30, 30d supply, fill #1
  Filled 2021-07-19: qty 30, 30d supply, fill #2

## 2021-03-01 MED ORDER — INSULIN GLARGINE 100 UNIT/ML SOLOSTAR PEN
32.0000 [IU] | PEN_INJECTOR | Freq: Every day | SUBCUTANEOUS | 3 refills | Status: DC
  Filled 2021-03-01: qty 12, 37d supply, fill #0
  Filled 2021-03-28: qty 9, 28d supply, fill #0
  Filled 2021-05-13: qty 9, 28d supply, fill #1
  Filled 2021-06-07: qty 9, 28d supply, fill #2
  Filled 2021-07-19: qty 9, 28d supply, fill #3
  Filled 2021-08-11: qty 9, 28d supply, fill #4
  Filled 2021-09-22: qty 3, 9d supply, fill #5

## 2021-03-01 MED ORDER — ATORVASTATIN CALCIUM 40 MG PO TABS
40.0000 mg | ORAL_TABLET | Freq: Every day | ORAL | 0 refills | Status: DC
  Filled 2021-03-01: qty 30, 30d supply, fill #0

## 2021-03-01 MED ORDER — AMLODIPINE BESYLATE 10 MG PO TABS
10.0000 mg | ORAL_TABLET | Freq: Every day | ORAL | 3 refills | Status: DC
  Filled 2021-03-01: qty 60, 60d supply, fill #0
  Filled 2021-03-28: qty 30, 30d supply, fill #0
  Filled 2021-05-13: qty 30, 30d supply, fill #1
  Filled 2021-06-07: qty 30, 30d supply, fill #2
  Filled 2021-07-19: qty 30, 30d supply, fill #3
  Filled 2021-08-17: qty 30, 30d supply, fill #4
  Filled 2021-09-22: qty 30, 30d supply, fill #5
  Filled 2021-10-31: qty 30, 30d supply, fill #6
  Filled 2021-12-13: qty 30, 30d supply, fill #7

## 2021-03-01 NOTE — Progress Notes (Signed)
Established Patient Office Visit  Subjective:  Patient ID: [name], male    DOB: 1970/08/02  Age: 51 y.o. MRN: [id number]  CC:  Chief Complaint  Patient presents with   Diabetes   Hypertension    HPI [name] presents for medication refills. He was last seen by Dr. Wynetta Emery via telemedicine on 04/02/2020, and in the office on 12/04/2019. He has previous diagnoses of Type II diabetes, hypertension, hyperlipidemia, GERD, and chronic pancreatitis. At this time, he reports that the only medications he has been taking are his Lantus insulin and amlodipine. He takes 32 units of insulin nightly. He checks his blood sugars sporadically, but reports that the last time he took his blood sugar it was about 115 mg/dL. His POC blood glucose was 143 mg/dL today, and his POC A1C was 6.6%. His blood pressure remains elevated today, with first measurement at 141/95, and a manual measurement of 140/92.  Today he has complaints of occasional tingling in the fingers. He notices this in the morning upon waking. He is not experiencing this elsewhere in his body. He reports that he continues to have GERD symptoms. He says these were better previously when he was taking omeprazole. He currently has been taking Tums for this issue. He has also been having issues with his bowel movements. He has been needing to strain to have a bowel movement, and they are often hard in consistency. He denies any bleeding or pain with bowel movements.  The patient is a smoker. He currently smokes about 1 pack per day. He is considering quitting in the future. Right now, he likes how smoking allows him to take a break at work. 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 He admits to drinking 40 oz of alcohol daily, typically a beer or malted beverage. He is interested in decreasing his alcohol consumption. His diet mainly consists of food he eats at work. There is a cafeteria there where he gets his meals, and he  typically eats meals such as chicken, rice, and beans. He is trying to eat lower carb.   He is not interested in receiving the flu vaccine today. He is due for labs including urine microalbumin, as well as for screenings for HIV and hepatitis C. He would like to complete these at today's visit.   Past Medical History:  Diagnosis Date   Renal disorder     Past Surgical History:  Procedure Laterality Date   DG 3RD DIGIT RIGHT HAND     DG 4TH DIGIT RIGHT HAND      Family History  Problem Relation Age of Onset   Stroke Father    Hypertension Father     Social History   Socioeconomic History   Marital status: Married    Spouse name: Not on file   Number of children: Not on file   Years of education: Not on file   Highest education level: Not on file  Occupational History   Not on file  Tobacco Use   Smoking status: Every Day    Packs/day: 0.50    Types: Cigarettes   Smokeless tobacco: Never  Vaping Use   Vaping Use: Never used  Substance and Sexual Activity   Alcohol use: Yes    Alcohol/week: 12.0 standard drinks    Types: 12 Cans of beer per week   Drug use: No   Sexual activity: Not Currently  Other Topics Concern   Not on file  Social History  Narrative   Not on file   Social Determinants of Health   Financial Resource Strain: Not on file  Food Insecurity: Not on file  Transportation Needs: Not on file  Physical Activity: Not on file  Stress: Not on file  Social Connections: Not on file  Intimate Partner Violence: Not on file    Outpatient Medications Prior to Visit  Medication Sig Dispense Refill   glucose blood test strip Use as instructed to check blood sugar 3 times daily. 100 each 11   TRUEplus Lancets 28G MISC Use as instructed to check blood sugar 3 times daily. 100 each 11   amLODipine (NORVASC) 10 MG tablet Take 1 tablet (10 mg total) by mouth daily. 30 tablet 0   insulin glargine (LANTUS) 100 UNIT/ML Solostar Pen Inject 32 into the skin. 9 mL 0    Insulin Pen Needle 29G X 12MM MISC Use with insulin pen 100 each 0   Blood Glucose Monitoring Suppl (TRUE METRIX METER) w/Device KIT Use as instructed to check blood sugar 3 times daily. 1 kit 0   atorvastatin (LIPITOR) 40 MG tablet Take 1 tablet (40 mg total) by mouth daily. (Patient not taking: Reported on 03/01/2021) 30 tablet 0   diclofenac Sodium (VOLTAREN) 1 % GEL Apply 2 g topically 4 (four) times daily. Apply to knee as needed for pain 100 g 0   fenofibrate (TRICOR) 145 MG tablet TAKE 1 TABLET (145 MG TOTAL) BY MOUTH DAILY. 30 tablet 2   fenofibrate (TRICOR) 145 MG tablet TAKE 1 TABLET (145 MG TOTAL) BY MOUTH DAILY. 30 tablet 2   omeprazole (PRILOSEC) 40 MG capsule Take 1 capsule (40 mg total) by mouth daily. 30 capsule 2   omeprazole (PRILOSEC) 40 MG capsule TAKE 1 CAPSULE (40 MG TOTAL) BY MOUTH DAILY. 30 capsule 2   omeprazole (PRILOSEC) 40 MG capsule TAKE 1 CAPSULE (40 MG TOTAL) BY MOUTH DAILY. 30 capsule 5   No facility-administered medications prior to visit.    No Known Allergies  ROS Review of Systems  Constitutional:  Positive for fatigue.  HENT: Negative.    Eyes: Negative.   Respiratory: Negative.    Cardiovascular:  Negative for chest pain.  Gastrointestinal:  Positive for constipation. Negative for blood in stool and rectal pain.  Endocrine: Negative.   Genitourinary: Negative.   Musculoskeletal: Negative.   Skin: Negative.   Allergic/Immunologic: Negative.   Neurological:  Positive for numbness.  Psychiatric/Behavioral:  Negative for dysphoric mood. The patient is nervous/anxious.      Objective:    Physical Exam Constitutional:      Appearance: Normal appearance. He is obese.  HENT:     Head: Normocephalic and atraumatic.     Right Ear: External ear normal.     Left Ear: External ear normal.     Mouth/Throat:     Mouth: Mucous membranes are moist.     Pharynx: Oropharynx is clear.  Eyes:     General: No scleral icterus.    Conjunctiva/sclera:  Conjunctivae normal.  Cardiovascular:     Rate and Rhythm: Normal rate and regular rhythm.     Pulses: Normal pulses.     Heart sounds: Normal heart sounds. No murmur heard.   No friction rub. No gallop.  Pulmonary:     Effort: Pulmonary effort is normal.     Breath sounds: Normal breath sounds. No wheezing, rhonchi or rales.  Skin:    General: Skin is warm and dry.  Neurological:     Mental  Status: He is alert.  Psychiatric:        Mood and Affect: Mood normal.        Behavior: Behavior normal.        Thought Content: Thought content normal.        Judgment: Judgment normal.    BP (!) 141/95    Pulse 86    Resp 16    Wt 241 lb 12.8 oz (109.7 kg)    SpO2 96%    BMI 33.72 kg/m  Wt Readings from Last 3 Encounters:  03/01/21 241 lb 12.8 oz (109.7 kg)  12/04/19 264 lb (119.7 kg)  10/09/19 260 lb (117.9 kg)     Health Maintenance Due  Topic Date Due   COVID-19 Vaccine (1) Never done   OPHTHALMOLOGY EXAM  Never done   COLON CANCER SCREENING ANNUAL FOBT  Never done   URINE MICROALBUMIN  12/03/2020    There are no preventive care reminders to display for this patient.  Lab Results  Component Value Date   TSH 1.290 08/25/2013   Lab Results  Component Value Date   WBC 7.4 01/04/2020   HGB 16.5 01/04/2020   HCT 47.5 01/04/2020   MCV 91.3 01/04/2020   PLT 402 (H) 01/04/2020   Lab Results  Component Value Date   NA 138 01/04/2020   K 4.2 01/04/2020   CO2 24 01/04/2020   GLUCOSE 165 (H) 01/04/2020   BUN 9 01/04/2020   CREATININE 0.91 01/04/2020   BILITOT 0.5 01/04/2020   ALKPHOS 53 01/04/2020   AST 24 01/04/2020   ALT 27 01/04/2020   PROT 7.8 01/04/2020   ALBUMIN 4.6 01/04/2020   CALCIUM 9.2 01/04/2020   ANIONGAP 11 01/04/2020   Lab Results  Component Value Date   CHOL 212 (H) 12/04/2019   Lab Results  Component Value Date   HDL 35 (L) 12/04/2019   Lab Results  Component Value Date   LDLCALC 143 (H) 12/04/2019   Lab Results  Component Value Date    TRIG 187 (H) 12/04/2019   Lab Results  Component Value Date   CHOLHDL 6.1 (H) 12/04/2019   Lab Results  Component Value Date   HGBA1C 6.6 03/01/2021      Assessment & Plan:   Problem List Items Addressed This Visit       Cardiovascular and Mediastinum   Essential hypertension    Blood pressure remains elevated, despite treatment with amlodipine. Increase amlodipine dosage to 10 mg. Follow up with Dr. Wynetta Emery in 2 months to assess therapeutic response.       Relevant Medications   amLODipine (NORVASC) 10 MG tablet   atorvastatin (LIPITOR) 40 MG tablet   fenofibrate (TRICOR) 145 MG tablet   Other Relevant Orders   CBC with Differential/Platelet     Digestive   Gastroesophageal reflux disease without esophagitis    Refill of omeprazole 40 mg sent to pharmacy. Patient advised on foods that may trigger worsening reflux such as fatty foods, fried foods, and spicy foods. Advised him to avoid laying down after meals.       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 Patient educated on the benefits of decreased alcohol consumption for his management of diabetes and hypertension. Lipase level ordered to assess his pancreatic function.        Relevant Medications   omeprazole (PRILOSEC) 40 MG capsule   Other Relevant  Orders   Lipase     Endocrine   Controlled type 2 diabetes mellitus ([place]) - Primary    Patient's diabetes is well-controlled at this time, with HbA1C at 6.6%. Continue on insulin, Lantus 32 units nightly. Foot exam was within normal limits today.   Discussed lifestyle management of diabetes including diet. Education provided on whole foods diet including lots of fruits, vegetables, and fiber rich foods. Encouraged him to drink lots of water.   Urine microalbumin completed today in lab.       Relevant Medications    atorvastatin (LIPITOR) 40 MG tablet   insulin glargine (LANTUS) 100 UNIT/ML Solostar Pen   Hyperlipidemia associated with type 2 diabetes mellitus ([place])    Patient has not been taking atorvastatin or fenofibrate for his hyperlipidemia. Lipid panel to be drawn today to assess his lipid levels. Restart atorvastatin and fenofibrate. Prescriptions sent to the pharmacy. Will assess therapeutic response at next appointment.       Relevant Medications   amLODipine (NORVASC) 10 MG tablet   atorvastatin (LIPITOR) 40 MG tablet   fenofibrate (TRICOR) 145 MG tablet   insulin glargine (LANTUS) 100 UNIT/ML Solostar Pen     Other   Tobacco dependence    Discussed his tobacco dependence at today's visit and discussed options for smoking cessation. He is not interested in quitting yet, but is considering cutting back in the future. Provided education regarding smoking cessation and sent prescription for nicotine lozenges to the pharmacy.      Current smoking consumption amount: 1PPD  Dicsussion on advise to quit smoking and smoking impacts: Cv impacts  Patient's willingness to quit:  Not yet engaged to quit  Methods to quit smoking discussed:  behav mod and nicotine replacement  Medication management of smoking session drugs discussed:nicotine replacement  Resources provided:  AVS   Setting quit date not established  Follow-up arranged 2 month   Time spent counseling the patient:  5 min        Relevant Medications   nicotine polacrilex (NICORETTE MINI) 4 MG lozenge   Alcohol use    Discussed decreasing daily alcohol intake and the benefits this may have for his health. Referral to Asant, LCSW for behavioral therapy surrounding alcohol use.       Class 2 severe obesity due to excess calories with serious comorbidity in adult [facility])    Given dietary education      Relevant Medications   insulin glargine (LANTUS) 100 UNIT/ML Solostar Pen   Constipation    Patient's constipation  seems to be related to his low intake of dietary fiber and water. Discussed increasing dietary fiber through eating more fruits, vegetables, and drinking more water throughout the day. Discussed that an over the counter fiber supplement such as Benefiber may be helpful on days where his fiber intake may be lower.        Need for hepatitis C screening test    Patient is due for routine screening for Hepatits C, completed in lab today.       Relevant Orders   HCV Ab w Reflex to Quant PCR   Encounter for screening for HIV    Patient is due for routine screening for HIV, completed in lab today.       Relevant Orders   HIV Antibody (routine testing w rflx)   Colon cancer screening    Patient meets age guidelines for screening for colorectal cancers. FOBT ordered for patient to complete.  Relevant Orders   Fecal occult blood, imunochemical   Encounter for health-related screening    Routine labs ordered to assess kidney, liver, and blood counts. May adjust plan of care if any abnormalities result.       RESOLVED: Hyperlipidemia with target LDL less than 100   Relevant Medications   amLODipine (NORVASC) 10 MG tablet   atorvastatin (LIPITOR) 40 MG tablet   fenofibrate (TRICOR) 145 MG tablet    Meds ordered this encounter  Medications   amLODipine (NORVASC) 10 MG tablet    Sig: Take 1 tablet (10 mg total) by mouth daily.    Dispense:  60 tablet    Refill:  3    Must have office visit for refills   atorvastatin (LIPITOR) 40 MG tablet    Sig: Take 1 tablet (40 mg total) by mouth daily.    Dispense:  30 tablet    Refill:  0   fenofibrate (TRICOR) 145 MG tablet    Sig: TAKE 1 TABLET (145 MG TOTAL) BY MOUTH DAILY.    Dispense:  30 tablet    Refill:  2   insulin glargine (LANTUS) 100 UNIT/ML Solostar Pen    Sig: Inject 32 Units into the skin at bedtime.    Dispense:  12 mL    Refill:  3   Insulin Pen Needle 29G X 12.7MM MISC    Sig: use pen needle with insulin pen     Dispense:  100 each    Refill:  2   omeprazole (PRILOSEC) 40 MG capsule    Sig: TAKE 1 CAPSULE (40 MG TOTAL) BY MOUTH DAILY.    Dispense:  60 capsule    Refill:  5   nicotine polacrilex (NICORETTE MINI) 4 MG lozenge    Sig: Use three times a day to quit smoking    Dispense:  100 tablet    Refill:  4  38 min spent patient education,  counseling , assessing multiple problems, > 1 yr since last face to face OV,  multiple care gaps closed   Follow-up: Return in about 2 months (around 04/29/2021) for Dr Wynetta Emery.    Asencion Noble, MD

## 2021-03-01 NOTE — Assessment & Plan Note (Signed)
Discussed decreasing daily alcohol intake and the benefits this may have for his health. Referral to Asant, LCSW for behavioral therapy surrounding alcohol use.

## 2021-03-01 NOTE — Assessment & Plan Note (Signed)
Routine labs ordered to assess kidney, liver, and blood counts. May adjust plan of care if any abnormalities result.

## 2021-03-01 NOTE — Assessment & Plan Note (Addendum)
Discussed decreasing alcohol consumption. Patient educated on the benefits of decreased alcohol consumption for his management of diabetes and hypertension. Lipase level ordered to assess his pancreatic function.

## 2021-03-01 NOTE — Assessment & Plan Note (Addendum)
Discussed his tobacco dependence at today's visit and discussed options for smoking cessation. He is not interested in quitting yet, but is considering cutting back in the future. Provided education regarding smoking cessation and sent prescription for nicotine lozenges to the pharmacy.       Current smoking consumption amount: 1PPD   Dicsussion on advise to quit smoking and smoking impacts: Cv impacts   Patient's willingness to quit:  Not yet engaged to quit   Methods to quit smoking discussed:  behav mod and nicotine replacement   Medication management of smoking session drugs discussed:nicotine replacement   Resources provided:  AVS    Setting quit date not established   Follow-up arranged 2 month   Time spent counseling the patient:  5 min

## 2021-03-01 NOTE — Assessment & Plan Note (Signed)
Patient is due for routine screening for Hepatits C, completed in lab today.

## 2021-03-01 NOTE — Assessment & Plan Note (Signed)
Given dietary education

## 2021-03-01 NOTE — Assessment & Plan Note (Signed)
Patient meets age guidelines for screening for colorectal cancers. FOBT ordered for patient to complete.

## 2021-03-01 NOTE — Assessment & Plan Note (Signed)
Blood pressure remains elevated, despite treatment with amlodipine. Increase amlodipine dosage to 10 mg. Follow up with Dr. Wynetta Emery in 2 months to assess therapeutic response.

## 2021-03-01 NOTE — Assessment & Plan Note (Signed)
Patient has not been taking atorvastatin or fenofibrate for his hyperlipidemia. Lipid panel to be drawn today to assess his lipid levels. Restart atorvastatin and fenofibrate. Prescriptions sent to the pharmacy. Will assess therapeutic response at next appointment.

## 2021-03-01 NOTE — Telephone Encounter (Signed)
ETOH and tobacco use daily,  would benefit from counseling

## 2021-03-01 NOTE — Patient Instructions (Addendum)
Refills of your Atorvastatin, Fenofibrate, Amlodipine, Omeprazole, and insulin were sent to the pharmacy for you to pick up today.   Continue working on eating a healthy diet with lots of fruits and vegetables. This will help with your diabetes, blood pressure, and bloating. A handout is attached with information on this. Remember you can also supplement with Benefiber added to water.    We included some information on decreasing smoking; be thinking about this for your next appointment.   Labs will be drawn today to check your lipids, kidneys, and liver.  You declined the flu shot.  Please schedule an eye exam. We recommend [facility] on [address].   Please follow up with Dr. Wynetta Emery in 2 months.

## 2021-03-01 NOTE — Assessment & Plan Note (Signed)
Patient's constipation seems to be related to his low intake of dietary fiber and water. Discussed increasing dietary fiber through eating more fruits, vegetables, and drinking more water throughout the day. Discussed that an over the counter fiber supplement such as Benefiber may be helpful on days where his fiber intake may be lower.

## 2021-03-01 NOTE — Assessment & Plan Note (Signed)
Refill of omeprazole 40 mg sent to pharmacy. Patient advised on foods that may trigger worsening reflux such as fatty foods, fried foods, and spicy foods. Advised him to avoid laying down after meals.

## 2021-03-01 NOTE — Assessment & Plan Note (Addendum)
Patient's diabetes is well-controlled at this time, with HbA1C at 6.6%. Continue on insulin, Lantus 32 units nightly. Foot exam was within normal limits today.   Discussed lifestyle management of diabetes including diet. Education provided on whole foods diet including lots of fruits, vegetables, and fiber rich foods. Encouraged him to drink lots of water.   Urine microalbumin completed today in lab.

## 2021-03-01 NOTE — Assessment & Plan Note (Signed)
Patient is due for routine screening for HIV, completed in lab today.

## 2021-03-02 ENCOUNTER — Other Ambulatory Visit: Payer: Self-pay

## 2021-03-02 LAB — CBC WITH DIFFERENTIAL/PLATELET
Basophils Absolute: 0.1 10*3/uL (ref 0.0–0.2)
Basos: 1 %
EOS (ABSOLUTE): 0.6 10*3/uL — ABNORMAL HIGH (ref 0.0–0.4)
Eos: 6 %
Hematocrit: 46.5 % (ref 37.5–51.0)
Hemoglobin: 16.1 g/dL (ref 13.0–17.7)
Immature Grans (Abs): 0 10*3/uL (ref 0.0–0.1)
Immature Granulocytes: 0 %
Lymphocytes Absolute: 3.5 10*3/uL — ABNORMAL HIGH (ref 0.7–3.1)
Lymphs: 33 %
MCH: 32.4 pg (ref 26.6–33.0)
MCHC: 34.6 g/dL (ref 31.5–35.7)
MCV: 94 fL (ref 79–97)
Monocytes Absolute: 1 10*3/uL — ABNORMAL HIGH (ref 0.1–0.9)
Monocytes: 10 %
Neutrophils Absolute: 5.2 10*3/uL (ref 1.4–7.0)
Neutrophils: 50 %
Platelets: 483 10*3/uL — ABNORMAL HIGH (ref 150–450)
RBC: 4.97 x10E6/uL (ref 4.14–5.80)
RDW: 12.3 % (ref 11.6–15.4)
WBC: 10.4 10*3/uL (ref 3.4–10.8)

## 2021-03-02 LAB — MICROALBUMIN / CREATININE URINE RATIO
Creatinine, Urine: 114.2 mg/dL
Microalb/Creat Ratio: 4 mg/g creat (ref 0–29)
Microalbumin, Urine: 5 ug/mL

## 2021-03-02 LAB — HCV AB W REFLEX TO QUANT PCR: HCV Ab: <0.1 s/co ratio (ref 0.0–0.9)

## 2021-03-02 LAB — LIPID PANEL
Chol/HDL Ratio: 5.6 ratio — ABNORMAL HIGH (ref 0.0–5.0)
Cholesterol, Total: 264 mg/dL — ABNORMAL HIGH (ref 100–199)
HDL: 47 mg/dL (ref >39)
LDL Chol Calc (NIH): 151 mg/dL — ABNORMAL HIGH (ref 0–99)
Triglycerides: 354 mg/dL — ABNORMAL HIGH (ref 0–149)
VLDL Cholesterol Cal: 66 mg/dL — ABNORMAL HIGH (ref 5–40)

## 2021-03-02 LAB — LIPASE: Lipase: 23 U/L (ref 13–78)

## 2021-03-02 LAB — COMPREHENSIVE METABOLIC PANEL
ALT: 18 IU/L (ref 0–44)
AST: 18 IU/L (ref 0–40)
Albumin/Globulin Ratio: 1.7 (ref 1.2–2.2)
Albumin: 4.5 g/dL (ref 4.0–5.0)
Alkaline Phosphatase: 92 IU/L (ref 44–121)
BUN/Creatinine Ratio: 12 (ref 9–20)
BUN: 10 mg/dL (ref 6–24)
Bilirubin Total: 0.5 mg/dL (ref 0.0–1.2)
CO2: 24 mmol/L (ref 20–29)
Calcium: 10 mg/dL (ref 8.7–10.2)
Chloride: 97 mmol/L (ref 96–106)
Creatinine, Ser: 0.85 mg/dL (ref 0.76–1.27)
Globulin, Total: 2.7 g/dL (ref 1.5–4.5)
Glucose: 139 mg/dL — ABNORMAL HIGH (ref 70–99)
Potassium: 4.6 mmol/L (ref 3.5–5.2)
Sodium: 138 mmol/L (ref 134–144)
Total Protein: 7.2 g/dL (ref 6.0–8.5)
eGFR: 107 mL/min/{1.73_m2} (ref >59)

## 2021-03-02 LAB — HIV ANTIBODY (ROUTINE TESTING W REFLEX): HIV Screen 4th Generation wRfx: NONREACTIVE

## 2021-03-02 LAB — HCV INTERPRETATION

## 2021-03-03 ENCOUNTER — Telehealth: Payer: Self-pay | Admitting: Critical Care Medicine

## 2021-03-03 ENCOUNTER — Telehealth: Payer: Self-pay

## 2021-03-03 NOTE — Telephone Encounter (Signed)
Patient verified DOB Patient is aware of cholesterol level being elevated and needing to adhere to taking medication daily. Patient aware of all other labs being normal.

## 2021-03-03 NOTE — Telephone Encounter (Signed)
-----   Message from Elsie Stain, MD sent at 03/03/2021  5:48 AM EST ----- Let pt know cholesterol very high , make sure he gets back on his cholesterol meds we ordered.  Hep c and hib neg all other labs normal  urine normal no kidney damage

## 2021-03-03 NOTE — Telephone Encounter (Signed)
Called patient reviewed all information and repeated back to me. Will call if any questions.  ? ?

## 2021-03-04 ENCOUNTER — Other Ambulatory Visit: Payer: Self-pay

## 2021-03-08 ENCOUNTER — Other Ambulatory Visit: Payer: Self-pay

## 2021-03-28 ENCOUNTER — Other Ambulatory Visit: Payer: Self-pay

## 2021-03-29 ENCOUNTER — Other Ambulatory Visit: Payer: Self-pay

## 2021-04-08 NOTE — Telephone Encounter (Signed)
I attempted to call pt again, no answer, unable to leave vm. ?

## 2021-04-28 ENCOUNTER — Other Ambulatory Visit: Payer: Self-pay | Admitting: Critical Care Medicine

## 2021-04-28 ENCOUNTER — Other Ambulatory Visit: Payer: Self-pay

## 2021-04-28 DIAGNOSIS — E785 Hyperlipidemia, unspecified: Secondary | ICD-10-CM

## 2021-04-28 MED ORDER — ATORVASTATIN CALCIUM 40 MG PO TABS
40.0000 mg | ORAL_TABLET | Freq: Every day | ORAL | 0 refills | Status: DC
  Filled 2021-04-28: qty 30, 30d supply, fill #0

## 2021-04-29 ENCOUNTER — Other Ambulatory Visit: Payer: Self-pay

## 2021-05-02 ENCOUNTER — Ambulatory Visit: Payer: Self-pay | Admitting: Internal Medicine

## 2021-05-13 ENCOUNTER — Other Ambulatory Visit: Payer: Self-pay

## 2021-06-07 ENCOUNTER — Other Ambulatory Visit (HOSPITAL_COMMUNITY): Payer: Self-pay

## 2021-06-07 ENCOUNTER — Other Ambulatory Visit: Payer: Self-pay

## 2021-06-07 ENCOUNTER — Other Ambulatory Visit: Payer: Self-pay | Admitting: Internal Medicine

## 2021-06-07 DIAGNOSIS — E785 Hyperlipidemia, unspecified: Secondary | ICD-10-CM

## 2021-06-08 ENCOUNTER — Other Ambulatory Visit: Payer: Self-pay

## 2021-06-08 MED ORDER — ATORVASTATIN CALCIUM 40 MG PO TABS
40.0000 mg | ORAL_TABLET | Freq: Every day | ORAL | 0 refills | Status: DC
  Filled 2021-06-08 – 2021-06-17 (×2): qty 30, 30d supply, fill #0

## 2021-06-15 ENCOUNTER — Other Ambulatory Visit: Payer: Self-pay

## 2021-06-17 ENCOUNTER — Other Ambulatory Visit: Payer: Self-pay

## 2021-06-22 ENCOUNTER — Other Ambulatory Visit: Payer: Self-pay

## 2021-07-05 ENCOUNTER — Other Ambulatory Visit (HOSPITAL_COMMUNITY): Payer: Self-pay

## 2021-07-06 ENCOUNTER — Other Ambulatory Visit (HOSPITAL_COMMUNITY): Payer: Self-pay

## 2021-07-06 MED ORDER — FAMOTIDINE 10 MG PO TABS
10.0000 mg | ORAL_TABLET | Freq: Two times a day (BID) | ORAL | 1 refills | Status: DC
  Filled 2021-07-06: qty 90, 45d supply, fill #0

## 2021-07-19 ENCOUNTER — Other Ambulatory Visit: Payer: Self-pay | Admitting: Internal Medicine

## 2021-07-19 ENCOUNTER — Other Ambulatory Visit: Payer: Self-pay

## 2021-07-19 DIAGNOSIS — E785 Hyperlipidemia, unspecified: Secondary | ICD-10-CM

## 2021-07-20 ENCOUNTER — Other Ambulatory Visit (HOSPITAL_COMMUNITY): Payer: Self-pay

## 2021-07-20 MED ORDER — METFORMIN HCL ER 750 MG PO TB24
ORAL_TABLET | ORAL | 1 refills | Status: DC
  Filled 2021-07-20: qty 180, 90d supply, fill #0
  Filled 2021-09-22: qty 180, 44d supply, fill #0

## 2021-07-20 MED ORDER — ATORVASTATIN CALCIUM 40 MG PO TABS
40.0000 mg | ORAL_TABLET | Freq: Every day | ORAL | 1 refills | Status: DC
  Filled 2021-07-20: qty 90, 90d supply, fill #0
  Filled 2021-08-05: qty 30, 30d supply, fill #0
  Filled 2021-09-07 – 2021-09-22 (×2): qty 30, 30d supply, fill #1
  Filled 2021-10-31: qty 30, 30d supply, fill #2
  Filled 2021-12-13: qty 30, 30d supply, fill #3
  Filled 2022-01-11 (×2): qty 30, 30d supply, fill #4

## 2021-08-05 ENCOUNTER — Other Ambulatory Visit: Payer: Self-pay

## 2021-08-10 ENCOUNTER — Other Ambulatory Visit (HOSPITAL_COMMUNITY): Payer: Self-pay

## 2021-08-10 MED ORDER — NAPROXEN 500 MG PO TABS
500.0000 mg | ORAL_TABLET | Freq: Two times a day (BID) | ORAL | 0 refills | Status: DC | PRN
  Filled 2021-08-10: qty 30, 15d supply, fill #0

## 2021-08-11 ENCOUNTER — Other Ambulatory Visit: Payer: Self-pay

## 2021-08-15 ENCOUNTER — Other Ambulatory Visit: Payer: Self-pay

## 2021-08-16 ENCOUNTER — Other Ambulatory Visit (HOSPITAL_COMMUNITY): Payer: Self-pay

## 2021-08-17 ENCOUNTER — Other Ambulatory Visit: Payer: Self-pay

## 2021-08-17 ENCOUNTER — Other Ambulatory Visit: Payer: Self-pay | Admitting: Critical Care Medicine

## 2021-08-17 MED ORDER — FENOFIBRATE 145 MG PO TABS
ORAL_TABLET | Freq: Every day | ORAL | 0 refills | Status: DC
  Filled 2021-08-17: qty 30, 30d supply, fill #0

## 2021-09-07 ENCOUNTER — Other Ambulatory Visit: Payer: Self-pay

## 2021-09-07 ENCOUNTER — Other Ambulatory Visit (HOSPITAL_COMMUNITY): Payer: Self-pay

## 2021-09-08 ENCOUNTER — Other Ambulatory Visit (HOSPITAL_COMMUNITY): Payer: Self-pay

## 2021-09-08 MED ORDER — NAPROXEN 500 MG PO TABS
500.0000 mg | ORAL_TABLET | Freq: Two times a day (BID) | ORAL | 0 refills | Status: DC | PRN
  Filled 2021-09-08: qty 30, 15d supply, fill #0

## 2021-09-13 ENCOUNTER — Other Ambulatory Visit: Payer: Self-pay

## 2021-09-20 ENCOUNTER — Other Ambulatory Visit (HOSPITAL_COMMUNITY): Payer: Self-pay

## 2021-09-22 ENCOUNTER — Other Ambulatory Visit: Payer: Self-pay

## 2021-09-22 ENCOUNTER — Other Ambulatory Visit: Payer: Self-pay | Admitting: Critical Care Medicine

## 2021-09-22 ENCOUNTER — Encounter: Payer: Self-pay | Admitting: *Deleted

## 2021-09-22 ENCOUNTER — Other Ambulatory Visit: Payer: Self-pay | Admitting: Internal Medicine

## 2021-09-22 MED ORDER — INSULIN GLARGINE 100 UNIT/ML SOLOSTAR PEN
32.0000 [IU] | PEN_INJECTOR | Freq: Every day | SUBCUTANEOUS | 0 refills | Status: DC
  Filled 2021-10-31: qty 12, 37d supply, fill #0

## 2021-09-22 NOTE — Telephone Encounter (Signed)
Requested medication (s) are due for refill today- yes  Requested medication (s) are on the active medication list -yes  Future visit scheduled -no  Last refill: 08/17/21 #30  Notes to clinic: Sent for review of request- last RF has notes  Requested Prescriptions  Pending Prescriptions Disp Refills   fenofibrate (TRICOR) 145 MG tablet 30 tablet 0    Sig: TAKE 1 TABLET (145 MG TOTAL) BY MOUTH DAILY.     Cardiovascular:  Antilipid - Fibric Acid Derivatives Failed - 09/22/2021 12:05 PM      Failed - PLT in normal range and within 360 days    Platelets  Date Value Ref Range Status  03/01/2021 483 (H) 150 - 450 x10E3/uL Final         Failed - Lipid Panel in normal range within the last 12 months    Cholesterol, Total  Date Value Ref Range Status  03/01/2021 264 (H) 100 - 199 mg/dL Final   LDL Chol Calc (NIH)  Date Value Ref Range Status  03/01/2021 151 (H) 0 - 99 mg/dL Final   HDL  Date Value Ref Range Status  03/01/2021 47 >39 mg/dL Final   Triglycerides  Date Value Ref Range Status  03/01/2021 354 (H) 0 - 149 mg/dL Final         Passed - ALT in normal range and within 360 days    ALT  Date Value Ref Range Status  03/01/2021 18 0 - 44 IU/L Final         Passed - AST in normal range and within 360 days    AST  Date Value Ref Range Status  03/01/2021 18 0 - 40 IU/L Final         Passed - Cr in normal range and within 360 days    Creat  Date Value Ref Range Status  08/25/2013 1.25 0.50 - 1.35 mg/dL Final   Creatinine, Ser  Date Value Ref Range Status  03/01/2021 0.85 0.76 - 1.27 mg/dL Final   Creatinine,U  Date Value Ref Range Status  10/23/2006   Final   146.7 (NOTE)  Cutoff Values for Urine Drug Screen:        Drug Class           Cutoff (ng/mL)        Amphetamines            1000        Barbiturates             200        Cocaine Metabolites      300        Benzodiazepines          200        Methadone                 300        Opiates                  2000        Phencyclidine             25        Propoxyphene             300        Marijuana Metabolites     50  For medical purposes only.         Passed - HGB in normal range and within 360 days    Hemoglobin  Date  Value Ref Range Status  03/01/2021 16.1 13.0 - 17.7 g/dL Final         Passed - HCT in normal range and within 360 days    Hematocrit  Date Value Ref Range Status  03/01/2021 46.5 37.5 - 51.0 % Final         Passed - WBC in normal range and within 360 days    WBC  Date Value Ref Range Status  03/01/2021 10.4 3.4 - 10.8 x10E3/uL Final  01/04/2020 7.4 4.0 - 10.5 K/uL Final         Passed - eGFR is 30 or above and within 360 days    GFR, Est African American  Date Value Ref Range Status  08/25/2013 82 mL/min Final   GFR calc Af Amer  Date Value Ref Range Status  03/17/2019 91 >59 mL/min/1.73 Final   GFR, Est Non African American  Date Value Ref Range Status  08/25/2013 71 mL/min Final    Comment:      The estimated GFR is a calculation valid for adults (>=20 years old) that uses the CKD-EPI algorithm to adjust for age and sex. It is   not to be used for children, pregnant women, hospitalized patients,    patients on dialysis, or with rapidly changing kidney function. According to the NKDEP, eGFR >89 is normal, 60-89 shows mild impairment, 30-59 shows moderate impairment, 15-29 shows severe impairment and <15 is ESRD.     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, with long-term current use of insulin ([place])   [place], Patrick E, MD   1 year ago Type 2  diabetes mellitus with obesity [facility])   [place] Karle Plumber B, MD   1 year ago Type 2 diabetes mellitus without complication, with long-term current use of insulin ([place])   [place], Neoma Laming B, MD   2 years ago Type 2 diabetes mellitus without complication, with long-term current use of insulin ([place])   [place] Karle Plumber B, MD   2 years ago Uncontrolled type 2 diabetes mellitus with hyperglycemia [facility])   [place], Jarome [place], RPH-CPP                 Requested Prescriptions  Pending Prescriptions Disp Refills   fenofibrate (TRICOR) 145 MG tablet 30 tablet 0    Sig: TAKE 1 TABLET (145 MG TOTAL) BY MOUTH DAILY.     Cardiovascular:  Antilipid - Fibric Acid Derivatives Failed - 09/22/2021 12:05 PM      Failed - PLT in normal range and within 360 days    Platelets  Date Value Ref Range Status  03/01/2021 483 (H) 150 - 450 x10E3/uL Final         Failed - Lipid Panel in normal range within the last 12 months    Cholesterol, Total  Date Value Ref Range Status  03/01/2021 264 (H)  100 - 199 mg/dL Final   LDL Chol Calc (NIH)  Date Value Ref Range Status  03/01/2021 151 (H) 0 - 99 mg/dL Final   HDL  Date Value Ref Range Status  03/01/2021 47 >39 mg/dL Final   Triglycerides  Date Value Ref Range Status  03/01/2021 354 (H) 0 - 149 mg/dL Final         Passed - ALT in normal range and within 360 days    ALT  Date Value Ref Range Status  03/01/2021 18 0 - 44 IU/L Final         Passed - AST in normal range and within 360 days    AST  Date Value Ref Range Status  03/01/2021 18 0 - 40 IU/L Final         Passed - Cr in normal range and within 360 days    Creat  Date Value Ref Range Status  08/25/2013 1.25 0.50 - 1.35 mg/dL Final   Creatinine, Ser  Date Value Ref Range Status  03/01/2021 0.85 0.76 - 1.27 mg/dL  Final   Creatinine,U  Date Value Ref Range Status  10/23/2006   Final   146.7 (NOTE)  Cutoff Values for Urine Drug Screen:        Drug Class           Cutoff (ng/mL)        Amphetamines            1000        Barbiturates             200        Cocaine Metabolites      300        Benzodiazepines          200        Methadone                 300        Opiates                 2000        Phencyclidine             25        Propoxyphene             300        Marijuana Metabolites     50  For medical purposes only.         Passed - HGB in normal range and within 360 days    Hemoglobin  Date Value Ref Range Status  03/01/2021 16.1 13.0 - 17.7 g/dL Final         Passed - HCT in normal range and within 360 days    Hematocrit  Date Value Ref Range Status  03/01/2021 46.5 37.5 - 51.0 % Final         Passed - WBC in normal range and within 360 days    WBC  Date Value Ref Range Status  03/01/2021 10.4 3.4 - 10.8 x10E3/uL Final  01/04/2020 7.4 4.0 - 10.5 K/uL Final         Passed - eGFR is 30 or above and within 360 days    GFR, Est African American  Date Value Ref Range Status  08/25/2013 82 mL/min Final   GFR calc Af Amer  Date Value Ref Range Status  03/17/2019 91 >59 mL/min/1.73 Final   GFR, Est Non African American  Date Value Ref Range  Status  08/25/2013 71 mL/min Final    Comment:      The estimated GFR is a calculation valid for adults (>=60 years old) that uses the CKD-EPI algorithm to adjust for age and sex. It is   not to be used for children, pregnant women, hospitalized patients,    patients on dialysis, or with rapidly changing kidney function. According to the NKDEP, eGFR >89 is normal, 60-89 shows mild impairment, 30-59 shows moderate impairment, 15-29 shows severe impairment and <15 is ESRD.     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

## 2021-09-22 NOTE — Telephone Encounter (Signed)
Attempted to call patient to schedule follow up appointment- no answer- unable to leave message. Message sent in [place]- and on courtesy RF Requested Prescriptions  Pending Prescriptions Disp Refills  . insulin glargine (LANTUS) 100 UNIT/ML Solostar Pen 12 mL 3    Sig: Inject 32 Units into the skin at bedtime.     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, with long-term current use of insulin ([place])   [place], Patrick E, MD   1 year ago Type 2 diabetes mellitus with obesity [facility])   [place], Deborah B, MD   1 year ago Type 2 diabetes mellitus without complication, with long-term current use of insulin [facility])   [place], Deborah B, MD   2 years ago Type 2 diabetes mellitus without complication, with long-term current use of insulin [facility])   [place], MD   2 years ago Uncontrolled type 2 diabetes mellitus with hyperglycemia [facility])   [place], RPH-CPP

## 2021-09-23 ENCOUNTER — Other Ambulatory Visit: Payer: Self-pay

## 2021-09-27 ENCOUNTER — Other Ambulatory Visit: Payer: Self-pay

## 2021-10-31 ENCOUNTER — Other Ambulatory Visit: Payer: Self-pay

## 2021-10-31 ENCOUNTER — Other Ambulatory Visit (HOSPITAL_COMMUNITY): Payer: Self-pay

## 2021-11-02 ENCOUNTER — Other Ambulatory Visit: Payer: Self-pay

## 2021-11-09 ENCOUNTER — Other Ambulatory Visit (HOSPITAL_COMMUNITY): Payer: Self-pay

## 2021-11-09 MED ORDER — NAPROXEN 500 MG PO TABS
500.0000 mg | ORAL_TABLET | Freq: Two times a day (BID) | ORAL | 0 refills | Status: DC
  Filled 2021-11-09: qty 60, 30d supply, fill #0

## 2021-11-10 ENCOUNTER — Other Ambulatory Visit (HOSPITAL_COMMUNITY): Payer: Self-pay

## 2021-11-28 ENCOUNTER — Emergency Department (HOSPITAL_BASED_OUTPATIENT_CLINIC_OR_DEPARTMENT_OTHER)
Admission: EM | Admit: 2021-11-28 | Discharge: 2021-11-28 | Disposition: A | Discharge status: Home / Self Care | Payer: Self-pay | Attending: Emergency Medicine | Admitting: Emergency Medicine

## 2021-11-28 ENCOUNTER — Encounter (HOSPITAL_BASED_OUTPATIENT_CLINIC_OR_DEPARTMENT_OTHER): Payer: Self-pay

## 2021-11-28 ENCOUNTER — Other Ambulatory Visit: Payer: Self-pay

## 2021-11-28 DIAGNOSIS — Z794 Long term (current) use of insulin: Secondary | ICD-10-CM | POA: Insufficient documentation

## 2021-11-28 DIAGNOSIS — Z79899 Other long term (current) drug therapy: Secondary | ICD-10-CM | POA: Insufficient documentation

## 2021-11-28 DIAGNOSIS — K047 Periapical abscess without sinus: Secondary | ICD-10-CM | POA: Insufficient documentation

## 2021-11-28 MED ORDER — AMOXICILLIN-POT CLAVULANATE 875-125 MG PO TABS
1.0000 | ORAL_TABLET | Freq: Two times a day (BID) | ORAL | 0 refills | Status: AC
  Filled 2021-11-28: qty 20, 10d supply, fill #0

## 2021-11-28 MED ORDER — AMOXICILLIN-POT CLAVULANATE 875-125 MG PO TABS
1.0000 | ORAL_TABLET | Freq: Once | ORAL | Status: AC
  Administered 2021-11-28: 1 via ORAL
  Filled 2021-11-28: qty 1

## 2021-11-28 MED ORDER — HYDROCODONE-ACETAMINOPHEN 5-325 MG PO TABS
2.0000 | ORAL_TABLET | Freq: Once | ORAL | Status: AC
  Administered 2021-11-28: 2 via ORAL
  Filled 2021-11-28: qty 2

## 2021-11-28 MED ORDER — HYDROCODONE-ACETAMINOPHEN 5-325 MG PO TABS
1.0000 | ORAL_TABLET | Freq: Four times a day (QID) | ORAL | 0 refills | Status: DC | PRN
  Filled 2021-11-28: qty 12, 2d supply, fill #0

## 2021-11-28 NOTE — ED Notes (Signed)
RN provided AVS using Teachback Method. Patient verbalizes understanding of Discharge Instructions. Opportunity for Questioning and Answers were provided by RN. Patient Discharged from ED ambulatory to Home. ° °

## 2021-11-28 NOTE — ED Provider Notes (Signed)
[facility] EMERGENCY DEPT Provider Note   CSN: [id number] Arrival date & time: 11/28/21  1801     History  Chief Complaint  Patient presents with   Oral Swelling    [name] is a 51 y.o. male.  HPI   51 year old male with medical history significant for poor dentition who presents to the emergency department with left-sided facial swelling and pain.  The patient states that symptoms started on Friday.  He reports left maxillary pain and some swelling.  He has poor dentition at baseline.  He does have a history of dental decay.  He denies any fevers or chills.  He is tolerating oral intake and able to open his mouth.  Home Medications Prior to Admission medications   Medication Sig Start Date End Date Taking? Authorizing Provider  amoxicillin-clavulanate (AUGMENTIN) 875-125 MG tablet Take 1 tablet by mouth every 12 (twelve) hours for 10 days. 11/28/21 12/08/21 Yes Regan Lemming, MD  HYDROcodone-acetaminophen (NORCO/VICODIN) 5-325 MG tablet Take 1-2 tablets by mouth every 6 (six) hours as needed. 11/28/21  Yes Regan Lemming, MD  amLODipine (NORVASC) 10 MG tablet Take 1 tablet (10 mg total) by mouth daily. 03/01/21   Elsie Stain, MD  atorvastatin (LIPITOR) 40 MG tablet Take 1 tablet (40 mg total) by mouth daily. 07/19/21     Blood Glucose Monitoring Suppl (TRUE METRIX METER) w/Device KIT Use as instructed to check blood sugar 3 times daily. 01/14/19   Ladell Pier, MD  famotidine (PEPCID) 10 MG tablet Take 1 tablet (10 mg total) by mouth 2 (two) times a day if needed for heartburn. 07/05/21     fenofibrate (TRICOR) 145 MG tablet TAKE 1 TABLET (145 MG TOTAL) BY MOUTH DAILY. 08/17/21   Ladell Pier, MD  glucose blood test strip Use as instructed to check blood sugar 3 times daily. 01/14/19   Ladell Pier, MD  insulin glargine (LANTUS) 100 UNIT/ML Solostar Pen Inject 32 Units into the skin at bedtime. 09/22/21   Elsie Stain, MD  Insulin Pen  Needle 29G X 12.7MM MISC use pen needle with insulin pen 03/01/21   Elsie Stain, MD  metFORMIN (GLUCOPHAGE-XR) 750 MG 24 hr tablet Take 1 tablet (750 mg total) by mouth daily for 14 days, THEN 2 tablets (1,500 mg total) daily. Do not crush, chew, or split. 07/19/21 11/06/21    naproxen (NAPROSYN) 500 MG tablet Take 1 tablet (500 mg total) by mouth 2 (two) times daily if needed for mild pain 09/08/21     naproxen (NAPROSYN) 500 MG tablet Take 1 tablet (500 mg total) by mouth 2 (two) times daily 1 in the morning and 1 in the evening. Take with meals 11/08/21     nicotine polacrilex (NICORETTE MINI) 4 MG lozenge Use three times a day to quit smoking 03/01/21   Elsie Stain, MD  omeprazole (PRILOSEC) 40 MG capsule TAKE 1 CAPSULE (40 MG TOTAL) BY MOUTH DAILY. 03/01/21 03/01/22  Elsie Stain, MD  TRUEplus Lancets 28G MISC Use as instructed to check blood sugar 3 times daily. 01/14/19   Ladell Pier, MD      Allergies    Patient has no known allergies.    Review of Systems   Review of Systems  All other systems reviewed and are negative.   Physical Exam Updated Vital Signs BP (!) 147/83 (BP Location: Right Arm)   Pulse 90   Temp 98.2 F (36.8 C)   Resp 18  Ht _0  (1.803 m)   Wt 111.1 kg   SpO2 99%   BMI 34.17 kg/m  Physical Exam Vitals and nursing note reviewed.  Constitutional:      General: He is not in acute distress. HENT:     Head: Normocephalic and atraumatic.     Nose:     Comments: Left maxillary swelling and mild tenderness Eyes:     Conjunctiva/sclera: Conjunctivae normal.     Pupils: Pupils are equal, round, and reactive to light.  Cardiovascular:     Rate and Rhythm: Normal rate and regular rhythm.  Pulmonary:     Effort: Pulmonary effort is normal. No respiratory distress.  Abdominal:     General: There is no distension.     Tenderness: There is no guarding.  Musculoskeletal:        General: No deformity or signs of injury.     Cervical back:  Neck supple.  Skin:    Findings: No lesion or rash.  Neurological:     General: No focal deficit present.     Mental Status: He is alert. Mental status is at baseline.     ED Results / Procedures / Treatments   Labs (all labs ordered are listed, but only abnormal results are displayed) Labs Reviewed - No data to display  EKG None  Radiology No results found.  Procedures Procedures    Medications Ordered in ED Medications  HYDROcodone-acetaminophen (NORCO/VICODIN) 5-325 MG per tablet 2 tablet (2 tablets Oral Given 11/28/21 2234)  amoxicillin-clavulanate (AUGMENTIN) 875-125 MG per tablet 1 tablet (1 tablet Oral Given 11/28/21 2234)    ED Course/ Medical Decision Making/ A&P                           Medical Decision Making Risk Prescription drug management.     51 year old male with medical history significant for poor dentition who presents to the emergency department with left-sided facial swelling and pain.  The patient states that symptoms started on Friday.  He reports left maxillary pain and some swelling.  He has poor dentition at baseline.  He does have a history of dental decay.  He denies any fevers or chills.  He is tolerating oral intake and able to open his mouth.  On arrival, the patient had no trismus, was vitally stable, left-sided facial swelling with mild tenderness present.  Concern for possible developing odontogenic infection.  Poor dentition was noted on exam.  No palpable periapical abscess for immediate drainage ascertained.  Patient is afebrile, vitally stable, tolerating oral intake, in no distress.  Given short duration of symptoms, overall well-appearing on exam, do not feel that CT imaging is warranted at this time.  No evidence of periorbital or orbital cellulitis, no pain with extraocular movements, no significant periorbital swelling.  No tongue elevation, no concern for [place]'s, no trismus on exam, no peritonsillar swelling to suggest PTA,  good range of motion of the neck, no concern for RPA.  Will start the patient on Augmentin, provide Norco for pain control and have the patient follow-up outpatient with a dentist.  Return precautions provided in the event of worsening symptoms.  Final Clinical Impression(s) / ED Diagnoses Final diagnoses:  Dental infection    Rx / DC Orders ED Discharge Orders          Ordered    HYDROcodone-acetaminophen (NORCO/VICODIN) 5-325 MG tablet  Every 6 hours PRN        11/28/21  2218    amoxicillin-clavulanate (AUGMENTIN) 875-125 MG tablet  Every 12 hours        11/28/21 2218              Regan Lemming, MD 11/28/21 2352

## 2021-11-28 NOTE — ED Triage Notes (Signed)
Pt reports L sided facial swelling since Friday. Pt reports pain is now unbearable. Dental decay noted.

## 2021-11-28 NOTE — Discharge Instructions (Addendum)
Recommend you follow-up outpatient with a dentist. Augmentin for antibiotics and Norco for pain control

## 2021-11-29 ENCOUNTER — Other Ambulatory Visit: Payer: Self-pay

## 2021-12-13 ENCOUNTER — Other Ambulatory Visit (HOSPITAL_COMMUNITY): Payer: Self-pay

## 2021-12-13 ENCOUNTER — Other Ambulatory Visit: Payer: Self-pay | Admitting: Critical Care Medicine

## 2021-12-13 ENCOUNTER — Other Ambulatory Visit: Payer: Self-pay

## 2021-12-16 ENCOUNTER — Other Ambulatory Visit: Payer: Self-pay

## 2021-12-26 ENCOUNTER — Other Ambulatory Visit: Payer: Self-pay | Admitting: Critical Care Medicine

## 2021-12-26 ENCOUNTER — Other Ambulatory Visit: Payer: Self-pay

## 2021-12-28 ENCOUNTER — Other Ambulatory Visit (HOSPITAL_COMMUNITY): Payer: Self-pay

## 2021-12-28 MED ORDER — MOUNJARO 2.5 MG/0.5ML ~~LOC~~ SOAJ
2.5000 mg | SUBCUTANEOUS | 0 refills | Status: DC
  Filled 2021-12-28: qty 2, 28d supply, fill #0

## 2022-01-05 ENCOUNTER — Other Ambulatory Visit: Payer: Self-pay

## 2022-01-10 ENCOUNTER — Other Ambulatory Visit (HOSPITAL_COMMUNITY): Payer: Self-pay

## 2022-01-10 MED ORDER — ATORVASTATIN CALCIUM 40 MG PO TABS
40.0000 mg | ORAL_TABLET | Freq: Every day | ORAL | 1 refills | Status: DC
  Filled 2022-01-10: qty 90, 90d supply, fill #0
  Filled 2022-01-11: qty 30, 30d supply, fill #0
  Filled 2022-01-11: qty 90, 90d supply, fill #0

## 2022-01-10 MED ORDER — MOUNJARO 5 MG/0.5ML ~~LOC~~ SOAJ: 5.0000 mg | SUBCUTANEOUS | 0 refills | Status: DC

## 2022-01-11 ENCOUNTER — Ambulatory Visit: Payer: Self-pay | Admitting: Physician Assistant

## 2022-01-11 ENCOUNTER — Other Ambulatory Visit (HOSPITAL_COMMUNITY): Payer: Self-pay

## 2022-01-11 ENCOUNTER — Other Ambulatory Visit: Payer: Self-pay | Admitting: Critical Care Medicine

## 2022-01-12 ENCOUNTER — Other Ambulatory Visit (HOSPITAL_COMMUNITY): Payer: Self-pay

## 2022-01-12 ENCOUNTER — Other Ambulatory Visit: Payer: Self-pay | Admitting: Critical Care Medicine

## 2022-01-13 ENCOUNTER — Other Ambulatory Visit (HOSPITAL_COMMUNITY): Payer: Self-pay

## 2022-01-13 MED ORDER — AMLODIPINE BESYLATE 10 MG PO TABS
10.0000 mg | ORAL_TABLET | Freq: Every day | ORAL | 0 refills | Status: DC
  Filled 2022-01-13: qty 30, 30d supply, fill #0

## 2022-02-02 ENCOUNTER — Encounter: Payer: Self-pay | Admitting: Internal Medicine

## 2022-02-02 ENCOUNTER — Ambulatory Visit: Payer: Self-pay | Admitting: Physician Assistant

## 2022-02-02 ENCOUNTER — Other Ambulatory Visit: Payer: Self-pay

## 2022-02-02 ENCOUNTER — Ambulatory Visit: Payer: BLUE CROSS/BLUE SHIELD | Attending: Physician Assistant | Admitting: Internal Medicine

## 2022-02-02 VITALS — BP 134/90 | HR 87 | Temp 97.7°F | Ht 71.0 in | Wt 228.0 lb

## 2022-02-02 DIAGNOSIS — Z1211 Encounter for screening for malignant neoplasm of colon: Secondary | ICD-10-CM

## 2022-02-02 DIAGNOSIS — E1169 Type 2 diabetes mellitus with other specified complication: Secondary | ICD-10-CM

## 2022-02-02 DIAGNOSIS — F17200 Nicotine dependence, unspecified, uncomplicated: Secondary | ICD-10-CM

## 2022-02-02 DIAGNOSIS — Z6831 Body mass index (BMI) 31.0-31.9, adult: Secondary | ICD-10-CM

## 2022-02-02 DIAGNOSIS — I152 Hypertension secondary to endocrine disorders: Secondary | ICD-10-CM

## 2022-02-02 DIAGNOSIS — E785 Hyperlipidemia, unspecified: Secondary | ICD-10-CM

## 2022-02-02 DIAGNOSIS — E669 Obesity, unspecified: Secondary | ICD-10-CM

## 2022-02-02 DIAGNOSIS — E1159 Type 2 diabetes mellitus with other circulatory complications: Secondary | ICD-10-CM

## 2022-02-02 DIAGNOSIS — F17210 Nicotine dependence, cigarettes, uncomplicated: Secondary | ICD-10-CM

## 2022-02-02 MED ORDER — METFORMIN HCL ER 750 MG PO TB24
750.0000 mg | ORAL_TABLET | Freq: Two times a day (BID) | ORAL | 1 refills | Status: DC
  Filled 2022-02-02: qty 180, 90d supply, fill #0
  Filled 2022-02-03: qty 120, 60d supply, fill #0
  Filled 2022-04-12: qty 120, 60d supply, fill #1
  Filled 2022-04-13: qty 60, 30d supply, fill #1
  Filled 2022-05-19: qty 60, 30d supply, fill #2
  Filled 2022-06-30: qty 60, 30d supply, fill #3
  Filled 2022-10-09: qty 60, 30d supply, fill #0

## 2022-02-02 MED ORDER — FREESTYLE LIBRE READER DEVI
0 refills | Status: AC
  Filled 2022-02-02 – 2022-02-07 (×2): qty 1, 30d supply, fill #0

## 2022-02-02 MED ORDER — AMLODIPINE BESYLATE 10 MG PO TABS
10.0000 mg | ORAL_TABLET | Freq: Every day | ORAL | 1 refills | Status: DC
  Filled 2022-02-02 – 2022-02-15 (×2): qty 90, 90d supply, fill #0
  Filled 2022-05-19: qty 30, 30d supply, fill #1
  Filled 2022-06-30: qty 30, 30d supply, fill #2
  Filled 2022-10-09 – 2023-01-10 (×2): qty 30, 30d supply, fill #0

## 2022-02-02 MED ORDER — FREESTYLE LIBRE SENSOR SYSTEM MISC
12 refills | Status: DC
  Filled 2022-02-02 – 2022-02-07 (×2): qty 2, 28d supply, fill #0

## 2022-02-02 MED ORDER — FAMOTIDINE 10 MG PO TABS
10.0000 mg | ORAL_TABLET | Freq: Two times a day (BID) | ORAL | 1 refills | Status: DC
  Filled 2022-02-02: qty 60, 30d supply, fill #0

## 2022-02-02 MED ORDER — VALSARTAN 40 MG PO TABS
40.0000 mg | ORAL_TABLET | Freq: Every day | ORAL | 3 refills | Status: DC
  Filled 2022-02-02: qty 90, 90d supply, fill #0
  Filled 2022-05-19: qty 30, 30d supply, fill #1
  Filled 2022-06-30: qty 30, 30d supply, fill #2
  Filled 2022-10-09 – 2023-01-10 (×2): qty 30, 30d supply, fill #0

## 2022-02-02 MED ORDER — ATORVASTATIN CALCIUM 40 MG PO TABS
40.0000 mg | ORAL_TABLET | Freq: Every day | ORAL | 1 refills | Status: DC
  Filled 2022-02-02 – 2022-02-15 (×2): qty 90, 90d supply, fill #0
  Filled 2022-05-19: qty 90, 90d supply, fill #1

## 2022-02-02 NOTE — Progress Notes (Signed)
Patient ID: [name], male    DOB: February 27, 1970  MRN: [id number]  CC: Diabetes (DM f/u. Med refill /Pain on L wrist X2 mo.)   Subjective: [name] is a 52 y.o. male who presents for chronic ds.  Patient was last seen 1 year ago by Dr. Joya Gaskins. Management.  Wife is with him. His concerns today include:  Patient with history of DM type II, HL, tobacco dependence, HTN, chronic pancreatitis, GERD  Patient was being seen at [facility] in [place].  Patient states it is close to his job at the [employer] in [place] where he has been working for the past 1 year.  He decided to change back to [place] because he was not pleased with the care he was given there.  Last saw his provider there 12/27/2021  DM Last A1C 5.9 on 11/22/2021 On Metformin XR 750 mg 2 daily but out x 2 wks.  Was on Lantus insulin 20 units but none in 3 mths.  Reports he was taken off the Lantus.  Mounjario prescribed  but he did not get it.  He does not feel that he needs it since his A1c was good just on metformin and he has lost some weight in the past year. Has not check BS in a while Cut back on potions and eating more veggies; does not drink sodas.  If he does it is diet soda.  Drinks plenty of water Down 13 lbs in the past yr Very active at work which involves a lot of bending, lifting and walking at the [employer].  9 hrs 5 days a wk Due for eye exam  HTN: taking Norvasc 10 mg daily and took already for today. Has home device for BP but has not checked in past 1 wk.  Range 140s/90s per his wife I was able to review chemistry that was done by his previous PCP in [place] in October.  His GFR was 108.  HL:  only on Atorvastatin, no longer on Fenofibrate  Tob dep:  smoking 1 pk a day.  Not ready to quit.    HM:  due for colon CA screen, Declines Shingles vaccine.  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 1 kit 0   glucose blood test strip Use as instructed to check blood sugar 3 times daily. 100 each 11   Insulin Pen Needle 29G X 12.7MM MISC use pen needle with insulin pen 100 each 2   omeprazole (PRILOSEC) 40 MG capsule TAKE 1 CAPSULE (40 MG TOTAL) BY MOUTH DAILY. 60 capsule 5   TRUEplus Lancets 28G MISC Use as instructed to check blood sugar 3 times daily. 100 each 11  No current facility-administered medications on file prior to visit.    No Known Allergies  Social History   Socioeconomic History   Marital status: Married    Spouse name: Not on file   Number of children: Not on file   Years of education: Not on file   Highest education level: Not on file  Occupational History   Not on file  Tobacco Use   Smoking status: Every Day    Packs/day: 1.00    Types: Cigarettes   Smokeless tobacco: Never  Vaping Use   Vaping Use: Never used  Substance and Sexual Activity   Alcohol use: Yes    Alcohol/week: 12.0 standard drinks of alcohol    Types: 12 Cans of beer per week   Drug use: No   Sexual activity: Not Currently  Other Topics Concern   Not on file  Social History Narrative   Not on file   Social Determinants of Health    Financial Resource Strain: Not on file  Food Insecurity: Not on file  Transportation Needs: Not on file  Physical Activity: Not on file  Stress: Not on file  Social Connections: Not on file  Intimate Partner Violence: Not on file    Family History  Problem Relation Age of Onset   Stroke Father    Hypertension Father     Past Surgical History:  Procedure Laterality Date   DG 3RD DIGIT RIGHT HAND     DG 4TH DIGIT RIGHT HAND      ROS: Review of Systems Negative except as stated above  PHYSICAL EXAM: BP (!) 134/90   Pulse 87   Temp 97.7 F (36.5 C) (Oral)   Ht _0  (1.803 m)   Wt 228 lb (103.4 kg)   SpO2 99%   BMI 31.80 kg/m   Wt Readings from Last 3 Encounters:  02/02/22 228 lb (103.4 kg)  11/28/21 245 lb (111.1 kg)  03/01/21 241 lb 12.8 oz (109.7 kg)    Physical Exam   General appearance - alert, well appearing, older African-American male and in no distress Mental status - normal mood, behavior, speech, dress, motor activity, and thought processes Neck - supple, no significant adenopathy Chest - clear to auscultation, no wheezes, rales or rhonchi, symmetric air entry Heart - normal rate, regular rhythm, normal S1, S2, no murmurs, rubs, clicks or gallops Extremities - peripheral pulses normal, no pedal edema, no clubbing or cyanosis Diabetic Foot Exam - Simple   Simple Foot Form Diabetic Foot exam was performed with the following findings: Yes 02/02/2022  6:07 PM  Visual Inspection See comments: Yes Sensation Testing Intact to touch and monofilament testing bilaterally: Yes Pulse Check Posterior Tibialis and Dorsalis pulse intact bilaterally: Yes Comments Mildly flat-footed.        Latest Ref Rng & Units 03/01/2021   11:09 AM 01/04/2020   10:34 AM 12/04/2019    3:16 PM  CMP  Glucose 70 - 99 mg/dL 139  165    BUN 6 - 24 mg/dL 10  9    Creatinine 0.76 - 1.27 mg/dL 0.85  0.91    Sodium 134 - 144 mmol/L 138  138    Potassium 3.5 - 5.2 mmol/L 4.6   4.2    Chloride 96 - 106 mmol/L 97  103    CO2 20 - 29 mmol/L 24  24    Calcium 8.7 - 10.2 mg/dL 10.0  9.2    Total Protein 6.0 - 8.5 g/dL 7.2  7.8  7.3   Total Bilirubin 0.0 - 1.2 mg/dL 0.5  0.5  0.4   Alkaline Phos 44 - 121 IU/L 92  53  66   AST 0 - 40 IU/L _0 ALT 0 - 44 IU/L _1 Lipid Panel     Component Value Date/Time   CHOL 264 (H) 03/01/2021 1109   TRIG 354 (H) 03/01/2021 1109   HDL 47 03/01/2021 1109   CHOLHDL 5.6 (H) 03/01/2021 1109   CHOLHDL 5.2 08/25/2013 1746   VLDL 52 (H) 08/25/2013 1746   LDLCALC 151 (H) 03/01/2021 1109    CBC    Component Value Date/Time   WBC 10.4 03/01/2021 1109   WBC 7.4 01/04/2020 1034   RBC 4.97 03/01/2021 1109   RBC 5.20 01/04/2020 1034   HGB 16.1 03/01/2021 1109   HCT 46.5 03/01/2021 1109   PLT 483 (H) 03/01/2021 1109   MCV 94 03/01/2021 1109   MCH 32.4 03/01/2021 1109   MCH 31.7 01/04/2020 1034   MCHC 34.6 03/01/2021 1109   MCHC 34.7 01/04/2020 1034   RDW 12.3 03/01/2021 1109   LYMPHSABS 3.5 (H) 03/01/2021 1109   MONOABS 0.6 01/04/2020 1034   EOSABS 0.6 (H) 03/01/2021 1109   BASOSABS 0.1 03/01/2021 1109    ASSESSMENT AND PLAN:  1. Type 2 diabetes mellitus with obesity (HCC) Last A1c was at goal. Commended him on weight loss.  Encouraged him to continue moving is much as he can.  Healthy eating habits discussed.  Refill given on metformin.  Will see if his insurance would pay for continuous glucose monitor. - Microalbumin / creatinine urine ratio - Ambulatory referral to Ophthalmology - metFORMIN (GLUCOPHAGE-XR) 750 MG 24 hr tablet; Take 1 tablet (750 mg total) by mouth in the morning and at bedtime.  Dispense: 180 tablet; Refill: 1 - Continuous Blood Gluc Sensor (FREESTYLE LIBRE SENSOR SYSTEM) MISC; Change sensor every 2 weeks.  Dispense: 2 each; Refill: 12 - Continuous Blood Gluc Receiver (FREESTYLE LIBRE READER) DEVI; UAD  Dispense: 1 each; Refill: 0  2. Hyperlipidemia associated with type 2 diabetes  mellitus (HCC) - atorvastatin (LIPITOR) 40 MG tablet; Take 1 tablet (40 mg total) by mouth daily.  Dispense: 90 tablet; Refill: 1  3. Hypertension associated with diabetes ([place]) Not at goal.  Continue amlodipine 10 mg daily.  Add valsartan 40 mg daily. - amLODipine (NORVASC) 10 MG tablet; Take 1 tablet (10 mg total) by mouth daily.  Dispense: 90 tablet; Refill: 1 - valsartan (DIOVAN) 40 MG tablet; Take 1 tablet (40 mg total) by mouth daily.  Dispense: 90 tablet; Refill: 3  4. Tobacco dependence Advised to quit.  Patient not ready to give a trial of quitting.  5. Screening for colon cancer Discussed colon cancer screening recommendations and methods.  He prefers to have colonoscopy. - Ambulatory referral to Gastroenterology  Patient was given the opportunity to ask questions.  Patient verbalized understanding of the plan and was able to repeat key elements of the plan.   This documentation was completed using Radio producer.  Any transcriptional errors are unintentional.  Orders Placed This Encounter  Procedures   Microalbumin / creatinine urine ratio   Ambulatory referral to Ophthalmology   Ambulatory referral to Gastroenterology     Requested Prescriptions   Signed Prescriptions Disp Refills   amLODipine (NORVASC) 10 MG tablet 90 tablet 1    Sig: Take 1 tablet (10 mg total) by mouth daily.  atorvastatin (LIPITOR) 40 MG tablet 90 tablet 1    Sig: Take 1 tablet (40 mg total) by mouth daily.   famotidine (PEPCID) 10 MG tablet 60 tablet 1    Sig: Take 1 tablet (10 mg total) by mouth 2 (two) times a day if needed for heartburn.   valsartan (DIOVAN) 40 MG tablet 90 tablet 3    Sig: Take 1 tablet (40 mg total) by mouth daily.   metFORMIN (GLUCOPHAGE-XR) 750 MG 24 hr tablet 180 tablet 1    Sig: Take 1 tablet (750 mg total) by mouth in the morning and at bedtime.   Continuous Blood Gluc Sensor (FREESTYLE LIBRE SENSOR SYSTEM) MISC 2 each 12    Sig: Change sensor  every 2 weeks.   Continuous Blood Gluc Receiver (FREESTYLE LIBRE READER) DEVI 1 each 0    Sig: UAD    Return in about 4 months (around 06/03/2022).  Karle Plumber, MD, FACP

## 2022-02-03 ENCOUNTER — Other Ambulatory Visit: Payer: Self-pay

## 2022-02-03 LAB — MICROALBUMIN / CREATININE URINE RATIO
Creatinine, Urine: 266.7 mg/dL
Microalb/Creat Ratio: 17 mg/g creat (ref 0–29)
Microalbumin, Urine: 44.3 ug/mL

## 2022-02-06 ENCOUNTER — Other Ambulatory Visit: Payer: Self-pay

## 2022-02-07 ENCOUNTER — Other Ambulatory Visit: Payer: Self-pay

## 2022-02-13 ENCOUNTER — Other Ambulatory Visit: Payer: Self-pay

## 2022-02-15 ENCOUNTER — Other Ambulatory Visit: Payer: Self-pay

## 2022-02-17 ENCOUNTER — Other Ambulatory Visit: Payer: Self-pay

## 2022-02-23 ENCOUNTER — Ambulatory Visit: Payer: Self-pay | Admitting: Physician Assistant

## 2022-03-03 ENCOUNTER — Encounter (HOSPITAL_BASED_OUTPATIENT_CLINIC_OR_DEPARTMENT_OTHER): Payer: Self-pay

## 2022-03-03 ENCOUNTER — Other Ambulatory Visit: Payer: Self-pay

## 2022-03-03 ENCOUNTER — Emergency Department (HOSPITAL_BASED_OUTPATIENT_CLINIC_OR_DEPARTMENT_OTHER)
Admission: EM | Admit: 2022-03-03 | Discharge: 2022-03-03 | Disposition: A | Discharge status: Home / Self Care | Payer: BC Managed Care – PPO | Attending: Emergency Medicine | Admitting: Emergency Medicine

## 2022-03-03 DIAGNOSIS — R110 Nausea: Secondary | ICD-10-CM | POA: Diagnosis not present

## 2022-03-03 DIAGNOSIS — K029 Dental caries, unspecified: Secondary | ICD-10-CM | POA: Insufficient documentation

## 2022-03-03 DIAGNOSIS — Z794 Long term (current) use of insulin: Secondary | ICD-10-CM | POA: Insufficient documentation

## 2022-03-03 DIAGNOSIS — K0889 Other specified disorders of teeth and supporting structures: Secondary | ICD-10-CM

## 2022-03-03 MED ORDER — IBUPROFEN 800 MG PO TABS
800.0000 mg | ORAL_TABLET | Freq: Once | ORAL | Status: AC
  Administered 2022-03-03: 800 mg via ORAL
  Filled 2022-03-03: qty 1

## 2022-03-03 MED ORDER — ACETAMINOPHEN 325 MG PO TABS
650.0000 mg | ORAL_TABLET | Freq: Once | ORAL | Status: AC
  Administered 2022-03-03: 650 mg via ORAL
  Filled 2022-03-03: qty 2

## 2022-03-03 MED ORDER — AMOXICILLIN 500 MG PO CAPS
500.0000 mg | ORAL_CAPSULE | Freq: Three times a day (TID) | ORAL | 0 refills | Status: DC
  Filled 2022-03-03: qty 21, 7d supply, fill #0

## 2022-03-03 MED ORDER — HYDROCODONE-ACETAMINOPHEN 5-325 MG PO TABS
2.0000 | ORAL_TABLET | ORAL | 0 refills | Status: DC | PRN
  Filled 2022-03-03: qty 10, 1d supply, fill #0

## 2022-03-03 NOTE — ED Triage Notes (Signed)
Pt c/o L lower dental pain and swelling starting last night.  Pt reports having several broken teeth.  Pain score 10/10.  Pt does not have a dentist.

## 2022-03-03 NOTE — ED Provider Notes (Signed)
[facility] Provider Note   CSN: [id number] Arrival date & time: 03/03/22  1308     History  Chief Complaint  Patient presents with   Dental Pain    [name] is a 52 y.o.  male with no significant past medical history presenting to the emergency room for evaluation of dental pain.  Patient reports that he started to have lower left jaw pain since yesterday.  Patient reports the pain, swelling has progressively worse.  Denies fever but endorses chills.  Patient has not tried any pain medication at home.  Patient has some difficulty with swallowing.  Endorses nausea without vomiting.   Dental Pain   Past Medical History:  Diagnosis Date   Renal disorder    Past Surgical History:  Procedure Laterality Date   DG 3RD DIGIT RIGHT HAND     DG 4TH DIGIT RIGHT HAND       Home Medications Prior to Admission medications   Medication Sig Start Date End Date Taking? Authorizing Provider  amoxicillin (AMOXIL) 500 MG capsule Take 1 capsule (500 mg total) by mouth 3 (three) times daily. 03/03/22  Yes Rex Kras, PA  amLODipine (NORVASC) 10 MG tablet Take 1 tablet (10 mg total) by mouth daily. 02/02/22   Ladell Pier, MD  atorvastatin (LIPITOR) 40 MG tablet Take 1 tablet (40 mg total) by mouth daily. 02/02/22   Ladell Pier, MD  Blood Glucose Monitoring Suppl (TRUE METRIX METER) w/Device KIT Use as instructed to check blood sugar 3 times daily. 01/14/19   Ladell Pier, MD  Continuous Blood Gluc Receiver (FREESTYLE LIBRE READER) DEVI Use as directed. 02/02/22   Ladell Pier, MD  Continuous Blood Gluc Sensor (FREESTYLE LIBRE SENSOR SYSTEM) MISC Change sensor every 2 weeks. 02/02/22   Ladell Pier, MD  famotidine (PEPCID) 10 MG tablet Take 1 tablet (10 mg total) by mouth 2 (two) times a day if needed for heartburn. 02/02/22   Ladell Pier, MD  glucose blood test strip Use as instructed to check blood sugar 3 times daily.  01/14/19   Ladell Pier, MD  Insulin Pen Needle 29G X 12.7MM MISC use pen needle with insulin pen 03/01/21   Elsie Stain, MD  metFORMIN (GLUCOPHAGE-XR) 750 MG 24 hr tablet Take 1 tablet (750 mg total) by mouth in the morning and at bedtime. 02/02/22   Ladell Pier, MD  omeprazole (PRILOSEC) 40 MG capsule TAKE 1 CAPSULE (40 MG TOTAL) BY MOUTH DAILY. 03/01/21 03/01/22  Elsie Stain, MD  TRUEplus Lancets 28G MISC Use as instructed to check blood sugar 3 times daily. 01/14/19   Ladell Pier, MD  valsartan (DIOVAN) 40 MG tablet Take 1 tablet (40 mg total) by mouth daily. 02/02/22   Ladell Pier, MD      Allergies    Patient has no known allergies.    Review of Systems   Review of Systems Negative except as per HPI.  Physical Exam Updated Vital Signs BP 133/89 (BP Location: Left Arm)   Pulse 86   Temp 97.9 F (36.6 C) (Oral)   Resp 20   Ht '5\' 11"'$  (1.803 m)   Wt 104.3 kg   SpO2 96%   BMI 32.08 kg/m  Physical Exam Vitals and nursing note reviewed.  Constitutional:      Appearance: Normal appearance.  HENT:     Head: Normocephalic.     Mouth/Throat:     Comments:  Dental fracture noted on the 3 L lower molar teeth. No bleeding. Moderate swelling. Eyes:     General: No scleral icterus. Pulmonary:     Effort: Pulmonary effort is normal.  Abdominal:     General: Abdomen is flat.  Musculoskeletal:        General: No deformity.  Neurological:     Mental Status: He is alert.  Psychiatric:        Mood and Affect: Mood normal.     ED Results / Procedures / Treatments   Labs (all labs ordered are listed, but only abnormal results are displayed) Labs Reviewed - No data to display  EKG None  Radiology No results found.  Procedures Procedures    Medications Ordered in ED Medications  ibuprofen (ADVIL) tablet 800 mg (has no administration in time range)  acetaminophen (TYLENOL) tablet 650 mg (has no administration in time range)    ED  Course/ Medical Decision Making/ A&P                             Medical Decision Making Risk OTC drugs. Prescription drug management.   This patient presents to the ED for dental pain, this involves an extensive number of treatment options, and is a complaint that carries with a high risk of complications and morbidity.  The differential diagnosis include dental caries, dental fracture, avulsion, pulpitis, gingivitis, infectious etiology.  This is not an exhaustive list.  Problem list/ ED course/ Critical interventions/ Medical management: HPI: See above Vital signs within normal range and stable throughout visit. Laboratory/imaging studies significant for: See above. On physical examination, patient is afebrile and appears in no acute distress. Patient presents for dental pain due to suspected dental cary. Patient not immunosuppressed, afebrile and well appearing with patent airway, have low suspicfion for deep space infection or any concern for airway compromise. Based on history, physical, and work up. There was evidence of tooth fracture but no avulsion, or bleeding socket. No evidence of RPA, PTA, Ludwig's angina, periapical abscess. Instructed patient to continue to treat pain with ibuprofen/acetaminophen until they see a dentist.  I sent an Rx of amoxicillin for dental infection. Patient discharged home and will follow up with dentist. Discussed return precautions for odontogenic infections and other dental pain emergencies. Will provide dental clinic list. I have reviewed the patient home medicines and have made adjustments as needed.  Cardiac monitoring/EKG: The patient was maintained on a cardiac monitor.  I personally reviewed and interpreted the cardiac monitor which showed an underlying rhythm of: sinus rhythm.  Additional history obtained: External records from outside source obtained and reviewed including: Chart review including previous notes, labs,  imaging.  Disposition Continued outpatient therapy. Follow-up with a dentist recommended for reevaluation of symptoms. Treatment plan discussed with patient.  Pt acknowledged understanding was agreeable to the plan. Worrisome signs and symptoms were discussed with patient, and patient acknowledged understanding to return to the ED if they noticed these signs and symptoms. Patient was stable upon discharge.   This chart was dictated using voice recognition software.  Despite best efforts to proofread,  errors can occur which can change the documentation meaning.          Final Clinical Impression(s) / ED Diagnoses Final diagnoses:  Pain, dental  Dental caries    Rx / DC Orders ED Discharge Orders          Ordered    amoxicillin (AMOXIL) 500 MG capsule  3 times daily        03/03/22 1510              Rex Kras, [place] 03/03/22 Dionicia Abler    Pattricia Boss, MD 03/04/22 [phone]

## 2022-03-03 NOTE — Discharge Instructions (Addendum)
Please take your antibiotics as prescribed. Please take tylenol and/or ibuprofen, use ice packs for pain. I recommend close follow-up with a dentist for reevaluation.  Please do not hesitate to return to emergency department if worrisome signs symptoms we discussed become apparent.

## 2022-04-11 ENCOUNTER — Telehealth: Payer: Self-pay | Admitting: Internal Medicine

## 2022-04-11 NOTE — Telephone Encounter (Signed)
Copied from [place] [id number]. Topic: Referral - Status >> Apr 11, 2022 12:44 PM Leone Payor F wrote: Reason for CRM: Caryl Pina from [facility] is calling because she says they received the referral for the pt in January and have been calling him to schedule an appointment but the pt doesn't answer or call them back. Caryl Pina just wanted to let someone know.

## 2022-04-12 ENCOUNTER — Other Ambulatory Visit: Payer: Self-pay

## 2022-04-12 ENCOUNTER — Other Ambulatory Visit: Payer: Self-pay | Admitting: Critical Care Medicine

## 2022-04-12 MED ORDER — OMEPRAZOLE 40 MG PO CPDR
DELAYED_RELEASE_CAPSULE | Freq: Every day | ORAL | 0 refills | Status: DC
  Filled 2022-04-12: qty 30, 30d supply, fill #0
  Filled 2022-05-19: qty 30, 30d supply, fill #1
  Filled 2022-06-30: qty 30, 30d supply, fill #2

## 2022-04-12 NOTE — Telephone Encounter (Signed)
Requested Prescriptions  Pending Prescriptions Disp Refills   omeprazole (PRILOSEC) 40 MG capsule 90 capsule 0    Sig: TAKE 1 CAPSULE (40 MG TOTAL) BY MOUTH DAILY.     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, with long-term current use of insulin [facility])   [place] Elsie Stain, MD   2 years ago Type 2 diabetes mellitus with obesity [facility])   [place] Karle Plumber B, MD   2 years ago Type 2 diabetes mellitus without complication, with long-term current use of insulin [facility])   [place] Karle Plumber B, MD   3 years ago Type 2 diabetes mellitus without complication, with long-term current use of insulin [facility])   [place], MD       Future Appointments             In 1 month Wynetta Emery, Dalbert Batman, MD [place]

## 2022-04-13 ENCOUNTER — Other Ambulatory Visit: Payer: Self-pay

## 2022-04-13 ENCOUNTER — Other Ambulatory Visit (HOSPITAL_COMMUNITY): Payer: Self-pay

## 2022-04-13 NOTE — Progress Notes (Signed)
Patient outreached by Estelle June, PharmD Candidate on 04/13/22 to discuss hypertension    Patient does not have an automated home blood pressure machine. They are currently working on obtaining one. He will check his BP at his local pharmacy. Typical readings are 123XX123 mmHg for systolic and ~ 90 mmHg for diastolic.   Medication review was performed. They are taking medications as prescribed.   The following barriers to adherence were noted:  - They do not have cost concerns.  - They do not have transportation concerns.  - They do not need assistance obtaining refills.  - They do not occasionally forget to take some of their prescribed medications.  - They do feel like one/some of their medications make them feel poorly. Patient stated that he has pain in his hands hours after taking BP medications  - They do not have questions or concerns about their medications.  - They do not have follow up scheduled with their primary care provider/cardiologist.   The following interventions were completed:  - Medications were reviewed  - Patient was educated on goal blood pressures and long term health implications of elevated blood pressure  - Patient was educated on how to access home blood pressure machine  - Patient was counseled on lifestyle modifications to improve blood pressure, including restricting starch and sodium intake. Patient states he eats good meals every day including meat and vegetables at dinner. Encouraged patient to continue the good eating habits and limit salt intake.   The patient has follow up scheduled:  PCP: Karle Plumber 06/05/2022    Estelle June  PharmD candidate, [place] [facility]   Maryan Puls, PharmD PGY-1 [facility] Resident

## 2022-04-14 ENCOUNTER — Other Ambulatory Visit: Payer: Self-pay

## 2022-05-19 ENCOUNTER — Other Ambulatory Visit: Payer: Self-pay

## 2022-05-22 ENCOUNTER — Other Ambulatory Visit: Payer: Self-pay

## 2022-06-05 ENCOUNTER — Ambulatory Visit: Payer: BLUE CROSS/BLUE SHIELD | Admitting: Internal Medicine

## 2022-06-07 ENCOUNTER — Other Ambulatory Visit: Payer: Self-pay | Admitting: Internal Medicine

## 2022-06-16 ENCOUNTER — Ambulatory Visit: Payer: BLUE CROSS/BLUE SHIELD | Admitting: Internal Medicine

## 2022-06-30 ENCOUNTER — Other Ambulatory Visit: Payer: Self-pay

## 2022-07-05 ENCOUNTER — Ambulatory Visit: Payer: BLUE CROSS/BLUE SHIELD | Admitting: Physician Assistant

## 2022-08-09 ENCOUNTER — Other Ambulatory Visit (HOSPITAL_COMMUNITY): Payer: Self-pay

## 2022-08-09 MED ORDER — FAMOTIDINE 20 MG PO TABS
20.0000 mg | ORAL_TABLET | Freq: Every day | ORAL | 1 refills | Status: AC
  Filled 2022-08-09: qty 30, 30d supply, fill #0
  Filled 2022-10-09: qty 30, 30d supply, fill #1
  Filled 2023-01-10: qty 30, 30d supply, fill #0
  Filled 2023-02-21: qty 30, 30d supply, fill #1

## 2022-08-09 MED ORDER — ATORVASTATIN CALCIUM 80 MG PO TABS
80.0000 mg | ORAL_TABLET | Freq: Every day | ORAL | 1 refills | Status: DC
  Filled 2022-08-09: qty 30, 30d supply, fill #0
  Filled 2022-10-09: qty 30, 30d supply, fill #1
  Filled 2023-01-10: qty 30, 30d supply, fill #0
  Filled 2023-02-21: qty 30, 30d supply, fill #1

## 2022-08-09 MED ORDER — TAMSULOSIN HCL 0.4 MG PO CAPS
0.4000 mg | ORAL_CAPSULE | Freq: Every day | ORAL | 1 refills | Status: AC
  Filled 2022-08-09: qty 30, 30d supply, fill #0
  Filled 2022-10-09: qty 30, 30d supply, fill #1
  Filled 2023-01-10: qty 30, 30d supply, fill #0
  Filled 2023-02-21: qty 30, 30d supply, fill #1

## 2022-08-09 MED ORDER — JARDIANCE 10 MG PO TABS
10.0000 mg | ORAL_TABLET | Freq: Every day | ORAL | 0 refills | Status: DC
  Filled 2022-08-09: qty 30, 30d supply, fill #0
  Filled 2022-10-09: qty 30, 30d supply, fill #1
  Filled 2023-01-10: qty 30, 30d supply, fill #0
  Filled 2023-02-21: qty 30, 30d supply, fill #1

## 2022-08-09 MED ORDER — METFORMIN HCL ER 750 MG PO TB24
1500.0000 mg | ORAL_TABLET | Freq: Every day | ORAL | 1 refills | Status: DC
  Filled 2022-08-09 – 2022-08-10 (×2): qty 180, 90d supply, fill #0
  Filled 2023-01-10: qty 60, 30d supply, fill #0
  Filled 2023-02-21: qty 60, 30d supply, fill #1

## 2022-08-10 ENCOUNTER — Other Ambulatory Visit (HOSPITAL_COMMUNITY): Payer: Self-pay

## 2022-08-10 ENCOUNTER — Other Ambulatory Visit: Payer: Self-pay

## 2022-10-05 ENCOUNTER — Encounter (HOSPITAL_COMMUNITY): Payer: Self-pay | Admitting: *Deleted

## 2022-10-05 ENCOUNTER — Emergency Department (HOSPITAL_COMMUNITY)
Admission: EM | Admit: 2022-10-05 | Discharge: 2022-10-06 | Disposition: A | Discharge status: Home / Self Care | Payer: 59 | Attending: Emergency Medicine | Admitting: Emergency Medicine

## 2022-10-05 ENCOUNTER — Emergency Department (HOSPITAL_COMMUNITY): Payer: 59

## 2022-10-05 ENCOUNTER — Other Ambulatory Visit: Payer: Self-pay

## 2022-10-05 DIAGNOSIS — N189 Chronic kidney disease, unspecified: Secondary | ICD-10-CM | POA: Diagnosis not present

## 2022-10-05 DIAGNOSIS — E279 Disorder of adrenal gland, unspecified: Secondary | ICD-10-CM | POA: Insufficient documentation

## 2022-10-05 DIAGNOSIS — X818XXA Intentional self-harm by jumping or lying in front of other moving object, initial encounter: Secondary | ICD-10-CM | POA: Diagnosis not present

## 2022-10-05 DIAGNOSIS — Z794 Long term (current) use of insulin: Secondary | ICD-10-CM | POA: Diagnosis not present

## 2022-10-05 DIAGNOSIS — F10129 Alcohol abuse with intoxication, unspecified: Secondary | ICD-10-CM | POA: Insufficient documentation

## 2022-10-05 DIAGNOSIS — I129 Hypertensive chronic kidney disease with stage 1 through stage 4 chronic kidney disease, or unspecified chronic kidney disease: Secondary | ICD-10-CM | POA: Insufficient documentation

## 2022-10-05 DIAGNOSIS — Z7984 Long term (current) use of oral hypoglycemic drugs: Secondary | ICD-10-CM | POA: Insufficient documentation

## 2022-10-05 DIAGNOSIS — E278 Other specified disorders of adrenal gland: Secondary | ICD-10-CM

## 2022-10-05 DIAGNOSIS — R739 Hyperglycemia, unspecified: Secondary | ICD-10-CM

## 2022-10-05 DIAGNOSIS — S299XXA Unspecified injury of thorax, initial encounter: Secondary | ICD-10-CM | POA: Insufficient documentation

## 2022-10-05 DIAGNOSIS — Y907 Blood alcohol level of 200-239 mg/100 ml: Secondary | ICD-10-CM | POA: Diagnosis not present

## 2022-10-05 DIAGNOSIS — R451 Restlessness and agitation: Secondary | ICD-10-CM | POA: Diagnosis not present

## 2022-10-05 DIAGNOSIS — F10120 Alcohol abuse with intoxication, uncomplicated: Secondary | ICD-10-CM | POA: Insufficient documentation

## 2022-10-05 DIAGNOSIS — E1165 Type 2 diabetes mellitus with hyperglycemia: Secondary | ICD-10-CM | POA: Diagnosis not present

## 2022-10-05 DIAGNOSIS — S0990XA Unspecified injury of head, initial encounter: Secondary | ICD-10-CM | POA: Insufficient documentation

## 2022-10-05 DIAGNOSIS — T1491XA Suicide attempt, initial encounter: Secondary | ICD-10-CM

## 2022-10-05 DIAGNOSIS — Y92410 Unspecified street and highway as the place of occurrence of the external cause: Secondary | ICD-10-CM | POA: Diagnosis not present

## 2022-10-05 DIAGNOSIS — R456 Violent behavior: Secondary | ICD-10-CM | POA: Insufficient documentation

## 2022-10-05 DIAGNOSIS — Z79899 Other long term (current) drug therapy: Secondary | ICD-10-CM | POA: Diagnosis not present

## 2022-10-05 HISTORY — DX: Type 2 diabetes mellitus without complications: E11.9

## 2022-10-05 LAB — CBC WITH DIFFERENTIAL/PLATELET
Abs Immature Granulocytes: 0.02 10*3/uL (ref 0.00–0.07)
Basophils Absolute: 0.1 10*3/uL (ref 0.0–0.1)
Basophils Relative: 1 %
Eosinophils Absolute: 0.3 10*3/uL (ref 0.0–0.5)
Eosinophils Relative: 4 %
HCT: 43 % (ref 39.0–52.0)
Hemoglobin: 14.9 g/dL (ref 13.0–17.0)
Immature Granulocytes: 0 %
Lymphocytes Relative: 48 %
Lymphs Abs: 4.3 10*3/uL — ABNORMAL HIGH (ref 0.7–4.0)
MCH: 32.2 pg (ref 26.0–34.0)
MCHC: 34.7 g/dL (ref 30.0–36.0)
MCV: 92.9 fL (ref 80.0–100.0)
Monocytes Absolute: 0.7 10*3/uL (ref 0.1–1.0)
Monocytes Relative: 9 %
Neutro Abs: 3.3 10*3/uL (ref 1.7–7.7)
Neutrophils Relative %: 38 %
Platelets: 376 10*3/uL (ref 150–400)
RBC: 4.63 MIL/uL (ref 4.22–5.81)
RDW: 11.9 % (ref 11.5–15.5)
WBC: 8.7 10*3/uL (ref 4.0–10.5)
nRBC: 0 % (ref 0.0–0.2)

## 2022-10-05 MED ORDER — STERILE WATER FOR INJECTION IJ SOLN
INTRAMUSCULAR | Status: AC
  Filled 2022-10-05: qty 10

## 2022-10-05 MED ORDER — ONDANSETRON HCL 4 MG/2ML IJ SOLN
4.0000 mg | Freq: Once | INTRAMUSCULAR | Status: AC
  Administered 2022-10-05: 4 mg via INTRAVENOUS
  Filled 2022-10-05: qty 2

## 2022-10-05 MED ORDER — MORPHINE SULFATE (PF) 4 MG/ML IV SOLN
4.0000 mg | Freq: Once | INTRAVENOUS | Status: AC
  Administered 2022-10-05: 4 mg via INTRAVENOUS
  Filled 2022-10-05: qty 1

## 2022-10-05 MED ORDER — ZIPRASIDONE MESYLATE 20 MG IM SOLR
20.0000 mg | INTRAMUSCULAR | Status: AC | PRN
  Administered 2022-10-05: 20 mg via INTRAMUSCULAR
  Filled 2022-10-05: qty 20

## 2022-10-05 NOTE — ED Notes (Signed)
Pt keeps asking about his keys, his phone and his bag. He did not arrive with any items other than his clothing via EMS

## 2022-10-05 NOTE — ED Notes (Signed)
Pt is yelling, cursing, says "give me my pain meds and I am leaving" PD at bedside "just kill me now, just shoot me, I am ready to die" pt with talk is tangential speech. Provider made aware of the same
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
## 2022-10-05 NOTE — ED Notes (Signed)
Wife called and states that she will be attempting to go downtown to fill out IVC paperwork. Can call for any questions, Abubakarr Broxterman [phone].
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
## 2022-10-06 ENCOUNTER — Emergency Department (HOSPITAL_COMMUNITY): Payer: 59

## 2022-10-06 DIAGNOSIS — F10129 Alcohol abuse with intoxication, unspecified: Secondary | ICD-10-CM | POA: Insufficient documentation

## 2022-10-06 LAB — COMPREHENSIVE METABOLIC PANEL
ALT: 20 U/L (ref 0–44)
AST: 21 U/L (ref 15–41)
Albumin: 3.8 g/dL (ref 3.5–5.0)
Alkaline Phosphatase: 68 U/L (ref 38–126)
Anion gap: 19 — ABNORMAL HIGH (ref 5–15)
BUN: 9 mg/dL (ref 6–20)
CO2: 19 mmol/L — ABNORMAL LOW (ref 22–32)
Calcium: 9.6 mg/dL (ref 8.9–10.3)
Chloride: 102 mmol/L (ref 98–111)
Creatinine, Ser: 0.88 mg/dL (ref 0.61–1.24)
GFR, Estimated: >60 mL/min (ref >60)
Glucose, Bld: 172 mg/dL — ABNORMAL HIGH (ref 70–99)
Potassium: 3.7 mmol/L (ref 3.5–5.1)
Sodium: 140 mmol/L (ref 135–145)
Total Bilirubin: 0.6 mg/dL (ref 0.3–1.2)
Total Protein: 7 g/dL (ref 6.5–8.1)

## 2022-10-06 LAB — TYPE AND SCREEN
ABO/RH(D): B POS
Antibody Screen: NEGATIVE

## 2022-10-06 LAB — RAPID URINE DRUG SCREEN, HOSP PERFORMED
Amphetamines: NOT DETECTED
Barbiturates: NOT DETECTED
Benzodiazepines: NOT DETECTED
Cocaine: NOT DETECTED
Opiates: NOT DETECTED
Tetrahydrocannabinol: POSITIVE — AB

## 2022-10-06 LAB — CBG MONITORING, ED: Glucose-Capillary: 138 mg/dL — ABNORMAL HIGH (ref 70–99)

## 2022-10-06 LAB — ETHANOL: Alcohol, Ethyl (B): 231 mg/dL — ABNORMAL HIGH (ref <10)

## 2022-10-06 MED ORDER — ATORVASTATIN CALCIUM 80 MG PO TABS
80.0000 mg | ORAL_TABLET | Freq: Every day | ORAL | Status: DC
  Administered 2022-10-06: 80 mg via ORAL
  Filled 2022-10-06: qty 1

## 2022-10-06 MED ORDER — METFORMIN HCL ER 750 MG PO TB24: 750.0000 mg | ORAL_TABLET | Freq: Two times a day (BID) | ORAL | Status: DC

## 2022-10-06 MED ORDER — IRBESARTAN 75 MG PO TABS
37.5000 mg | ORAL_TABLET | Freq: Every day | ORAL | Status: DC
  Administered 2022-10-06: 37.5 mg via ORAL
  Filled 2022-10-06: qty 0.5

## 2022-10-06 MED ORDER — ACETAMINOPHEN 325 MG PO TABS: 650.0000 mg | ORAL_TABLET | Freq: Once | ORAL | Status: DC

## 2022-10-06 MED ORDER — AMLODIPINE BESYLATE 5 MG PO TABS
10.0000 mg | ORAL_TABLET | Freq: Every day | ORAL | Status: DC
  Administered 2022-10-06: 10 mg via ORAL
  Filled 2022-10-06: qty 2

## 2022-10-06 MED ORDER — NAPROXEN 250 MG PO TABS
500.0000 mg | ORAL_TABLET | Freq: Once | ORAL | Status: AC
  Administered 2022-10-06: 500 mg via ORAL
  Filled 2022-10-06: qty 2

## 2022-10-06 MED ORDER — ACETAMINOPHEN 325 MG PO TABS
650.0000 mg | ORAL_TABLET | Freq: Once | ORAL | Status: AC
  Administered 2022-10-06: 650 mg via ORAL
  Filled 2022-10-06: qty 2

## 2022-10-06 MED ORDER — EMPAGLIFLOZIN 10 MG PO TABS
10.0000 mg | ORAL_TABLET | Freq: Every day | ORAL | Status: DC
  Administered 2022-10-06: 10 mg via ORAL
  Filled 2022-10-06: qty 1

## 2022-10-06 MED ORDER — SODIUM CHLORIDE 0.9 % IV BOLUS
1000.0000 mL | Freq: Once | INTRAVENOUS | Status: AC
  Administered 2022-10-06: 1000 mL via INTRAVENOUS

## 2022-10-06 MED ORDER — IOHEXOL 350 MG/ML SOLN
75.0000 mL | Freq: Once | INTRAVENOUS | Status: AC | PRN
  Administered 2022-10-06: 75 mL via INTRAVENOUS

## 2022-10-06 MED ORDER — FAMOTIDINE 20 MG PO TABS
10.0000 mg | ORAL_TABLET | Freq: Two times a day (BID) | ORAL | Status: DC
  Administered 2022-10-06: 10 mg via ORAL
  Filled 2022-10-06: qty 1

## 2022-10-06 NOTE — ED Notes (Signed)
Pt cleared by psych and dc reviewed with MD, Theresia Lo. Pt verbalized understanding and denies any SI/HI at time of discharge. Pt has ride home and feels safe going home at this time. All of belongings returnd to pt. Pt ambulated out of ED w steady gait, a&x4,vss,nad.
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
## 2022-10-06 NOTE — Discharge Instructions (Addendum)
Follow-up with your doctor to review your imaging results and labs from today's visit.  Radiology has recommended repeat imaging and follow-up.

## 2022-10-06 NOTE — ED Notes (Signed)
IVC papers in blue

## 2022-10-06 NOTE — ED Notes (Signed)
Staffing states there is no sitter available to watch this patient at this time

## 2022-10-06 NOTE — ED Notes (Signed)
Pt pulled off his collar, monitoring equipment and gown. Urinated on floor, threw tissues on the floor. Attempted to stand up at end of bed, unsteady on his feet. Pt placed back in bed. Continues to yell, curse. Changed into [place] scrubs. Belongings placed in locker 5 in purple zone.

## 2022-10-06 NOTE — ED Notes (Signed)
Unable to obtain vitals due to Pt's behavior

## 2022-10-06 NOTE — ED Provider Notes (Signed)
Emergency Medicine Observation Re-evaluation Note  [name] is a 52 y.o. male, seen on rounds today.  Pt initially presented to the ED for complaints of Suicide Attempt Currently, the patient is waken bed with no new complaints.  Physical Exam  BP (!) 150/98 (BP Location: Left Arm)   Pulse 74   Temp 97.9 F (36.6 C) (Oral)   Resp 18   SpO2 93%  Physical Exam General: Awake and alert no acute distress Cardiac: Regular rate Lungs: No increased work of breathing Psych: Calm, cooperative, denies active SI  ED Course / MDM  EKG:EKG Interpretation Date/Time:  Thursday October 05 2022 23:20:05 EDT Ventricular Rate:  83 PR Interval:  148 QRS Duration:  105 QT Interval:  364 QTC Calculation: 428 R Axis:   113  Text Interpretation: Sinus rhythm Right atrial enlargement Right axis deviation Borderline T abnormalities, inferior leads Confirmed by Anders Simmonds [phone]) on 10/06/2022 9:15:28 AM  I have reviewed the labs performed to date as well as medications administered while in observation.  Recent changes in the last 24 hours include patient is medically cleared.  He was evaluated by psychiatry who psychiatrically cleared him for outpatient management.  Plan  Current plan is for discharge with outpatient follow-up.    Elayne Snare K, DO 10/06/22 1434

## 2022-10-09 ENCOUNTER — Other Ambulatory Visit (HOSPITAL_COMMUNITY): Payer: Self-pay

## 2022-10-09 ENCOUNTER — Other Ambulatory Visit: Payer: Self-pay

## 2022-10-09 ENCOUNTER — Emergency Department (HOSPITAL_BASED_OUTPATIENT_CLINIC_OR_DEPARTMENT_OTHER)
Admission: EM | Admit: 2022-10-09 | Discharge: 2022-10-09 | Disposition: A | Discharge status: Home / Self Care | Payer: 59

## 2022-10-09 ENCOUNTER — Telehealth: Payer: Self-pay

## 2022-10-09 ENCOUNTER — Encounter (HOSPITAL_BASED_OUTPATIENT_CLINIC_OR_DEPARTMENT_OTHER): Payer: Self-pay

## 2022-10-09 ENCOUNTER — Emergency Department (HOSPITAL_BASED_OUTPATIENT_CLINIC_OR_DEPARTMENT_OTHER): Payer: 59 | Admitting: Radiology

## 2022-10-09 DIAGNOSIS — M545 Low back pain, unspecified: Secondary | ICD-10-CM | POA: Insufficient documentation

## 2022-10-09 DIAGNOSIS — R0781 Pleurodynia: Secondary | ICD-10-CM | POA: Diagnosis not present

## 2022-10-09 LAB — CBC WITH DIFFERENTIAL/PLATELET
Abs Immature Granulocytes: 0.02 10*3/uL (ref 0.00–0.07)
Basophils Absolute: 0 10*3/uL (ref 0.0–0.1)
Basophils Relative: 1 %
Eosinophils Absolute: 0.3 10*3/uL (ref 0.0–0.5)
Eosinophils Relative: 4 %
HCT: 42.4 % (ref 39.0–52.0)
Hemoglobin: 15.1 g/dL (ref 13.0–17.0)
Immature Granulocytes: 0 %
Lymphocytes Relative: 38 %
Lymphs Abs: 3.2 10*3/uL (ref 0.7–4.0)
MCH: 32.8 pg (ref 26.0–34.0)
MCHC: 35.6 g/dL (ref 30.0–36.0)
MCV: 92 fL (ref 80.0–100.0)
Monocytes Absolute: 0.9 10*3/uL (ref 0.1–1.0)
Monocytes Relative: 11 %
Neutro Abs: 3.8 10*3/uL (ref 1.7–7.7)
Neutrophils Relative %: 46 %
Platelets: 383 10*3/uL (ref 150–400)
RBC: 4.61 MIL/uL (ref 4.22–5.81)
RDW: 12 % (ref 11.5–15.5)
WBC: 8.3 10*3/uL (ref 4.0–10.5)
nRBC: 0 % (ref 0.0–0.2)

## 2022-10-09 LAB — COMPREHENSIVE METABOLIC PANEL
ALT: 10 U/L (ref 0–44)
AST: 12 U/L — ABNORMAL LOW (ref 15–41)
Albumin: 4.1 g/dL (ref 3.5–5.0)
Alkaline Phosphatase: 67 U/L (ref 38–126)
Anion gap: 8 (ref 5–15)
BUN: 14 mg/dL (ref 6–20)
CO2: 24 mmol/L (ref 22–32)
Calcium: 8.9 mg/dL (ref 8.9–10.3)
Chloride: 101 mmol/L (ref 98–111)
Creatinine, Ser: 0.98 mg/dL (ref 0.61–1.24)
GFR, Estimated: >60 mL/min (ref >60)
Glucose, Bld: 108 mg/dL — ABNORMAL HIGH (ref 70–99)
Potassium: 4.3 mmol/L (ref 3.5–5.1)
Sodium: 133 mmol/L — ABNORMAL LOW (ref 135–145)
Total Bilirubin: 0.4 mg/dL (ref 0.3–1.2)
Total Protein: 6.9 g/dL (ref 6.5–8.1)

## 2022-10-09 LAB — URINALYSIS, ROUTINE W REFLEX MICROSCOPIC
Bacteria, UA: NONE SEEN
Bilirubin Urine: NEGATIVE
Glucose, UA: NEGATIVE mg/dL
Hgb urine dipstick: NEGATIVE
Leukocytes,Ua: NEGATIVE
Nitrite: NEGATIVE
Protein, ur: 30 mg/dL — AB
Specific Gravity, Urine: 1.033 — ABNORMAL HIGH (ref 1.005–1.030)
pH: 6 (ref 5.0–8.0)

## 2022-10-09 MED ORDER — METHOCARBAMOL 500 MG PO TABS
500.0000 mg | ORAL_TABLET | Freq: Once | ORAL | Status: AC
  Administered 2022-10-09: 500 mg via ORAL
  Filled 2022-10-09: qty 1

## 2022-10-09 MED ORDER — IBUPROFEN 400 MG PO TABS
400.0000 mg | ORAL_TABLET | Freq: Once | ORAL | Status: AC
  Administered 2022-10-09: 400 mg via ORAL
  Filled 2022-10-09: qty 1

## 2022-10-09 MED ORDER — LIDOCAINE 5 % EX PTCH
1.0000 | MEDICATED_PATCH | Freq: Once | CUTANEOUS | Status: DC
  Administered 2022-10-09: 1 via TRANSDERMAL
  Filled 2022-10-09: qty 1

## 2022-10-09 MED ORDER — METHOCARBAMOL 500 MG PO TABS
500.0000 mg | ORAL_TABLET | Freq: Two times a day (BID) | ORAL | 0 refills | Status: AC
  Filled 2022-10-09: qty 10, 5d supply, fill #0

## 2022-10-09 MED ORDER — LIDOCAINE 4 % EX PTCH
1.0000 | MEDICATED_PATCH | CUTANEOUS | 0 refills | Status: AC
Start: 2022-10-09 — End: 2022-10-16
  Filled 2022-10-09: qty 30, 30d supply, fill #0

## 2022-10-09 MED ORDER — OXYCODONE-ACETAMINOPHEN 5-325 MG PO TABS
1.0000 | ORAL_TABLET | Freq: Once | ORAL | Status: AC
  Administered 2022-10-09: 1 via ORAL
  Filled 2022-10-09: qty 1

## 2022-10-09 MED ORDER — ACETAMINOPHEN 325 MG PO TABS
650.0000 mg | ORAL_TABLET | Freq: Once | ORAL | Status: AC
  Administered 2022-10-09: 650 mg via ORAL
  Filled 2022-10-09: qty 2

## 2022-10-09 NOTE — ED Notes (Addendum)
RT called to triage to assess patient. Pt expresses pain on deep inhalation to right side. BBS CTAB. No distress noted. SpO2 99%

## 2022-10-09 NOTE — Discharge Instructions (Signed)
He may take over-the-counter Tylenol alternating with Motrin for your pain.  We are also prescribing you a muscle relaxer.  Do not drive or operate heavy machinery while taking it.  You may also use lidocaine patches as needed for pain.  Please follow-up with your primary doctor.  Return develop any fevers, chills, severe pain, chest pain, shortness of breath, unilateral weakness, numbness in your genital area or any new or worsening symptoms that are concerning to you.
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
## 2022-10-09 NOTE — ED Notes (Signed)
Pt verbalized understanding of d/c instructions, meds, and followup care. Denies questions. VSS, no distress noted. Steady gait to exit with all belongings.  ?

## 2022-10-09 NOTE — Telephone Encounter (Signed)
Copied from CRM [id number]. Topic: General - Inquiry >> Oct 09, 2022 12:32 PM Patsy Lager T wrote: Reason for CRM: Donnamae Jude patients spouse request a call back from [facility] to discuss the medication JARDIANCE 10 MG TABS tablet. Please f/u with spouse
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
## 2022-10-10 ENCOUNTER — Other Ambulatory Visit: Payer: Self-pay

## 2022-10-13 NOTE — Telephone Encounter (Signed)
Called but no answer. Unable to LVM due to VM not being set up.

## 2022-10-16 NOTE — Telephone Encounter (Signed)
Called but no answer. Unable to LVM due to VM not being set up.

## 2022-10-17 NOTE — Telephone Encounter (Signed)
Called & spoke to the patient. Verified name & DOB. Informed that patient cannot be assisted until upcoming appointment to re-establish care on 10/26/2022. Informed to reach out to pharmacist at their pharmacy for further assistance if needed. Patient expressed verbal understanding of all discussed.

## 2022-10-19 ENCOUNTER — Other Ambulatory Visit (HOSPITAL_COMMUNITY): Payer: Self-pay

## 2022-10-26 ENCOUNTER — Ambulatory Visit: Payer: 59 | Admitting: Physician Assistant

## 2022-11-01 ENCOUNTER — Ambulatory Visit (INDEPENDENT_AMBULATORY_CARE_PROVIDER_SITE_OTHER): Payer: 59 | Admitting: Primary Care

## 2022-11-03 ENCOUNTER — Other Ambulatory Visit: Payer: Self-pay | Admitting: Internal Medicine

## 2022-11-03 DIAGNOSIS — Z1212 Encounter for screening for malignant neoplasm of rectum: Secondary | ICD-10-CM

## 2022-11-03 DIAGNOSIS — Z1211 Encounter for screening for malignant neoplasm of colon: Secondary | ICD-10-CM

## 2022-11-14 ENCOUNTER — Ambulatory Visit (INDEPENDENT_AMBULATORY_CARE_PROVIDER_SITE_OTHER): Payer: 59 | Admitting: Primary Care

## 2023-01-10 ENCOUNTER — Other Ambulatory Visit: Payer: Self-pay

## 2023-01-10 ENCOUNTER — Other Ambulatory Visit: Payer: Self-pay | Admitting: Internal Medicine

## 2023-01-10 DIAGNOSIS — E1159 Type 2 diabetes mellitus with other circulatory complications: Secondary | ICD-10-CM

## 2023-01-10 DIAGNOSIS — E1169 Type 2 diabetes mellitus with other specified complication: Secondary | ICD-10-CM

## 2023-01-10 DIAGNOSIS — E785 Hyperlipidemia, unspecified: Secondary | ICD-10-CM

## 2023-01-10 NOTE — Telephone Encounter (Signed)
Medication Refill -  Most Recent Primary Care Visit:  Provider: Jonah Blue B  Department: [facility]  Visit Type: OFFICE VISIT  Date: 02/02/2022  Medication: omeprazole 40mg / , amlodipine 10mg  /metformin 750 mg, lipitor 40mg  fenofidrate 145 mg tricor/famotidine (PEPCID) 20 MG tablet    Has the patient contacted their pharmacy? yes (Agent: If yes, when and what did the pharmacy advise?)contact pcp  Is this the correct pharmacy for this prescription? yes This is the patient's preferred pharmacy:  [facility] [address], [address] [place] [address] Phone: [phone] Fax: [phone]   Has the prescription been filled recently? no  Is the patient out of the medication? yes  Has the patient been seen for an appointment in the last year OR does the patient have an upcoming appointment? yes  Can we respond through MyChart? yes  Agent: Please be advised that Rx refills may take up to 3 business days. We ask that you follow-up with your pharmacy.

## 2023-01-11 ENCOUNTER — Other Ambulatory Visit: Payer: Self-pay

## 2023-01-11 MED ORDER — FAMOTIDINE 10 MG PO TABS
10.0000 mg | ORAL_TABLET | Freq: Two times a day (BID) | ORAL | 1 refills | Status: AC
Start: 2023-01-11 — End: ?
  Filled 2023-01-11 – 2023-03-08 (×2): qty 60, 30d supply, fill #0

## 2023-01-11 MED ORDER — METFORMIN HCL ER 750 MG PO TB24
750.0000 mg | ORAL_TABLET | Freq: Two times a day (BID) | ORAL | 0 refills | Status: DC
Start: 2023-01-11 — End: 2023-11-23
  Filled 2023-01-11 – 2023-03-08 (×2): qty 60, 30d supply, fill #0
  Filled 2023-09-14: qty 60, 30d supply, fill #1
  Filled 2023-11-08: qty 60, 30d supply, fill #2

## 2023-01-11 MED ORDER — AMLODIPINE BESYLATE 10 MG PO TABS
10.0000 mg | ORAL_TABLET | Freq: Every day | ORAL | 0 refills | Status: DC
  Filled 2023-02-21 – 2023-03-08 (×2): qty 30, 30d supply, fill #0
  Filled 2023-04-09: qty 30, 30d supply, fill #1
  Filled 2023-09-14: qty 30, 30d supply, fill #2

## 2023-01-11 MED ORDER — ATORVASTATIN CALCIUM 40 MG PO TABS
40.0000 mg | ORAL_TABLET | Freq: Every day | ORAL | 0 refills | Status: DC
Start: 2023-01-11 — End: 2023-11-23
  Filled 2023-01-11: qty 30, 30d supply, fill #0
  Filled 2023-02-21 – 2023-03-08 (×2): qty 30, 30d supply, fill #1
  Filled 2023-04-09: qty 30, 30d supply, fill #2

## 2023-01-11 MED ORDER — OMEPRAZOLE 40 MG PO CPDR
40.0000 mg | DELAYED_RELEASE_CAPSULE | Freq: Every day | ORAL | 0 refills | Status: AC
  Filled 2023-01-11: qty 30, 30d supply, fill #0
  Filled 2023-09-14: qty 90, 90d supply, fill #0

## 2023-01-11 NOTE — Telephone Encounter (Signed)
Pt. Has appointment. Requested Prescriptions  Pending Prescriptions Disp Refills   amLODipine (NORVASC) 10 MG tablet 90 tablet 0    Sig: Take 1 tablet (10 mg total) by mouth daily.     Cardiovascular: Calcium Channel Blockers 2 Failed - 01/11/2023 10:16 AM      Failed - Valid encounter within last 6 months    Recent Outpatient Visits           11 months ago Type 2 diabetes mellitus with obesity (HCC)   [place] [facility] - A Dept Of [place]. 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 [facility] Storm Frisk, MD   2 years ago Type 2 diabetes mellitus with obesity [facility])   [place] [facility] - A Dept Of [place]. [employer] Jonah Blue B, MD   3 years ago Type 2 diabetes mellitus without complication, with long-term current use of insulin (HCC)   [place] [facility] [place] - A Dept Of [place]. [facility] Jonah Blue B, MD   3 years ago Type 2 diabetes mellitus without complication, with long-term current use of insulin (HCC)   [place] [facility] [place] - A Dept Of [place]. [facility] Marcine Matar, MD       Future Appointments             In 2 months Laural Benes Binnie Rail, MD [facility] [place] - A Dept Of Eligha Bridegroom. [facility]            Passed - Last BP in normal range    BP Readings from Last 1 Encounters:  10/09/22 127/85         Passed - Last Heart Rate in normal range    Pulse Readings from Last 1 Encounters:  10/09/22 76          atorvastatin (LIPITOR) 40 MG tablet 90 tablet 0    Sig: Take 1 tablet (40 mg total) by mouth daily.     Cardiovascular:  Antilipid - Statins Failed - 01/11/2023 10:16 AM      Failed - Lipid Panel in normal range within the last 12 months     Cholesterol, Total  Date Value Ref Range Status  03/01/2021 264 (H) 100 - 199 mg/dL Final   LDL Chol Calc (NIH)  Date Value Ref Range Status  03/01/2021 151 (H) 0 - 99 mg/dL Final   HDL  Date Value Ref Range Status  03/01/2021 47 >39 mg/dL Final   Triglycerides  Date Value Ref Range Status  03/01/2021 354 (H) 0 - 149 mg/dL Final         Passed - Patient is not pregnant      Passed - Valid encounter within last 12 months    Recent Outpatient Visits           11 months ago Type 2 diabetes mellitus with obesity (HCC)   [place] [facility] - A Dept Of [place]. 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 [facility] Shan Levans  E, MD   2 years ago Type 2 diabetes mellitus with obesity (HCC)   [place] [facility] - A Dept Of [place]. [facility] Jonah Blue B, MD   3 years ago Type 2 diabetes mellitus without complication, with long-term current use of insulin (HCC)   [place] [facility] [place] - A Dept Of [place]. [facility] Jonah Blue B, MD   3 years ago Type 2 diabetes mellitus without complication, with long-term current use of insulin (HCC)   [place] [facility] [place] - A Dept Of [place]. [facility] Marcine Matar, MD       Future Appointments             In 2 months Laural Benes Binnie Rail, MD [facility] [place] - A Dept Of [place]igh[place] Bridegroom. [facility]             famotidine (PEPCID) 10 MG tablet 60 tablet 1    Sig: Take 1 tablet (10 mg total) by mouth 2 (two) times a day if needed for heartburn.     Gastroenterology:  H2 Antagonists Passed - 01/11/2023 10:16 AM      Passed - Valid encounter within last 12 months    Recent Outpatient Visits           11 months ago Type 2 diabetes  mellitus with obesity (HCC)   [place] [facility] - A Dept Of [place]. 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 [facility] Storm Frisk, MD   2 years ago Type 2 diabetes mellitus with obesity [facility])   [place] [facility] - A Dept Of [place]. [facility] Jonah Blue B, MD   3 years ago Type 2 diabetes mellitus without complication, with long-term current use of insulin (HCC)   [place] [facility] [place] - A Dept Of [place]. [facility] Jonah Blue B, MD   3 years ago Type 2 diabetes mellitus without complication, with long-term current use of insulin (HCC)   [place] [facility] [place] - A Dept Of [place]. [facility] Marcine Matar, MD       Future Appointments             In 2 months Laural Benes Binnie Rail, MD [facility] [place] - A Dept Of [place]igha Bridegroom. [facility]             metFORMIN (GLUCOPHAGE-XR) 750 MG 24 hr tablet 180 tablet 0    Sig: Take 1 tablet (750 mg total) by mouth in the morning and at bedtime.     Endocrinology:  Diabetes - Biguanides Failed - 01/11/2023 10:16 AM      Failed - HBA1C is between 0 and 7.9 and within 180 days    HbA1c, POC (controlled diabetic range)  Date Value Ref Range Status  03/01/2021 6.6 0.0 - 7.0 % Final         Failed - B12 Level in normal range and within 720 days    No results found for: "VITAMINB12"       Failed - Valid encounter within last 6 months    Recent Outpatient Visits  11 months ago Type 2 diabetes mellitus with obesity (HCC)   [place] [facility] - A Dept Of [place]. [facility] Jonah Blue B, MD   1 year ago Controlled type 2 diabetes mellitus with periodontal disease, with long-term current use  of insulin (HCC)   [place] [facility] - A Dept Of [place]. [facility] Storm Frisk, MD   2 years ago Type 2 diabetes mellitus with obesity [facility])   [place] [facility] - A Dept Of [place]. [facility] Jonah Blue B, MD   3 years ago Type 2 diabetes mellitus without complication, with long-term current use of insulin (HCC)   [place] [facility] [place] - A Dept Of [place]. [facility] Jonah Blue B, MD   3 years ago Type 2 diabetes mellitus without complication, with long-term current use of insulin (HCC)   [place] [facility] [place] - A Dept Of [place]. [facility] Marcine Matar, MD       Future Appointments             In 2 months Laural Benes Binnie Rail, MD [facility] [place] - A Dept Of [place]igha Bridegroom. [facility]            Passed - Cr in normal range and within 360 days    Creat  Date Value Ref Range Status  08/25/2013 1.25 0.50 - 1.35 mg/dL Final   Creatinine, Ser  Date Value Ref Range Status  10/09/2022 0.98 0.61 - 1.24 mg/dL Final   Creatinine,U  Date Value Ref Range Status  10/23/2006   Final   146.7 (NOTE)  Cutoff Values for Urine Drug Screen:        Drug Class           Cutoff (ng/mL)        Amphetamines            1000        Barbiturates             200        Cocaine Metabolites      300        Benzodiazepines          200        Methadone                 300        Opiates                 2000        Phencyclidine             25        Propoxyphene             300        Marijuana Metabolites     50  For medical purposes only.         Passed - eGFR in normal range and within 360 days    GFR, Est African American  Date Value Ref Range Status  08/25/2013 82 mL/min Final   GFR calc Af Amer  Date Value Ref Range Status  03/17/2019 91 >59 mL/min/1.73 Final   GFR, Est Non African American  Date Value Ref Range Status  08/25/2013 71 mL/min Final     Comment:      The estimated GFR is a calculation valid for adults (>=18  years old) that uses the CKD-EPI algorithm to adjust for age and sex. It is   not to be used for children, pregnant women, hospitalized patients,    patients on dialysis, or with rapidly changing kidney function. According to the NKDEP, eGFR >89 is normal, 60-89 shows mild impairment, 30-59 shows moderate impairment, 15-29 shows severe impairment and <15 is ESRD.     GFR, Estimated  Date Value Ref Range Status  10/09/2022 >60 >60 mL/min Final    Comment:    (NOTE) Calculated using the CKD-EPI Creatinine Equation (2021)    eGFR  Date Value Ref Range Status  03/01/2021 107 >59 mL/min/1.73 Final         Passed - CBC within normal limits and completed in the last 12 months    WBC  Date Value Ref Range Status  10/09/2022 8.3 4.0 - 10.5 K/uL Final   RBC  Date Value Ref Range Status  10/09/2022 4.61 4.22 - 5.81 MIL/uL Final   Hemoglobin  Date Value Ref Range Status  10/09/2022 15.1 13.0 - 17.0 g/dL Final  16/10/9602 54.0 13.0 - 17.7 g/dL Final   HCT  Date Value Ref Range Status  10/09/2022 42.4 39.0 - 52.0 % Final   Hematocrit  Date Value Ref Range Status  03/01/2021 46.5 37.5 - 51.0 % Final   MCHC  Date Value Ref Range Status  10/09/2022 35.6 30.0 - 36.0 g/dL Final   [facility]  Date Value Ref Range Status  10/09/2022 32.8 26.0 - 34.0 pg Final   MCV  Date Value Ref Range Status  10/09/2022 92.0 80.0 - 100.0 fL Final  03/01/2021 94 79 - 97 fL Final   No results found for: "PLTCOUNTKUC", "LABPLAT", "POCPLA" RDW  Date Value Ref Range Status  10/09/2022 12.0 11.5 - 15.5 % Final  03/01/2021 12.3 11.6 - 15.4 % Final          omeprazole (PRILOSEC) 40 MG capsule 90 capsule 0    Sig: TAKE 1 CAPSULE (40 MG TOTAL) BY MOUTH DAILY.     Gastroenterology: Proton Pump Inhibitors Passed - 01/11/2023 10:16 AM      Passed - Valid encounter within last 12 months    Recent Outpatient Visits           11  months ago Type 2 diabetes mellitus with obesity (HCC)   [place] [facility] - A Dept Of [place]. 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 [facility] Storm Frisk, MD   2 years ago Type 2 diabetes mellitus with obesity [facility])   [place] [facility] - A Dept Of [place]. [facility] Jonah Blue B, MD   3 years ago Type 2 diabetes mellitus without complication, with long-term current use of insulin (HCC)   [place] [facility] [place] - A Dept Of [place]. [facility] Jonah Blue B, MD   3 years ago Type 2 diabetes mellitus without complication, with long-term current use of insulin (HCC)    [facility] [place] - A Dept Of [place]. [facility] Marcine Matar, MD       Future Appointments             In 2 months Marcine Matar, MD [facility]  Welln[facility] - A Dept Of [place]. [facility]

## 2023-02-21 ENCOUNTER — Other Ambulatory Visit: Payer: Self-pay | Admitting: Internal Medicine

## 2023-02-21 ENCOUNTER — Other Ambulatory Visit: Payer: Self-pay

## 2023-02-21 DIAGNOSIS — E1159 Type 2 diabetes mellitus with other circulatory complications: Secondary | ICD-10-CM

## 2023-02-21 MED ORDER — VALSARTAN 40 MG PO TABS
40.0000 mg | ORAL_TABLET | Freq: Every day | ORAL | 0 refills | Status: DC
  Filled 2023-02-21 – 2023-09-14 (×2): qty 30, 30d supply, fill #0

## 2023-03-02 ENCOUNTER — Other Ambulatory Visit: Payer: Self-pay

## 2023-03-05 ENCOUNTER — Other Ambulatory Visit: Payer: Self-pay

## 2023-03-08 ENCOUNTER — Other Ambulatory Visit: Payer: Self-pay

## 2023-03-19 ENCOUNTER — Ambulatory Visit: Payer: 59 | Admitting: Internal Medicine

## 2023-04-09 ENCOUNTER — Other Ambulatory Visit: Payer: Self-pay

## 2023-05-01 IMAGING — CR DG LUMBAR SPINE COMPLETE 4+V
5 series · 5 of 5 positions shown · non-contrast
Comparison: None.

CLINICAL DATA: fall

EXAM:
LUMBAR SPINE - COMPLETE 4+ VIEW

[t lumbar spine ap]
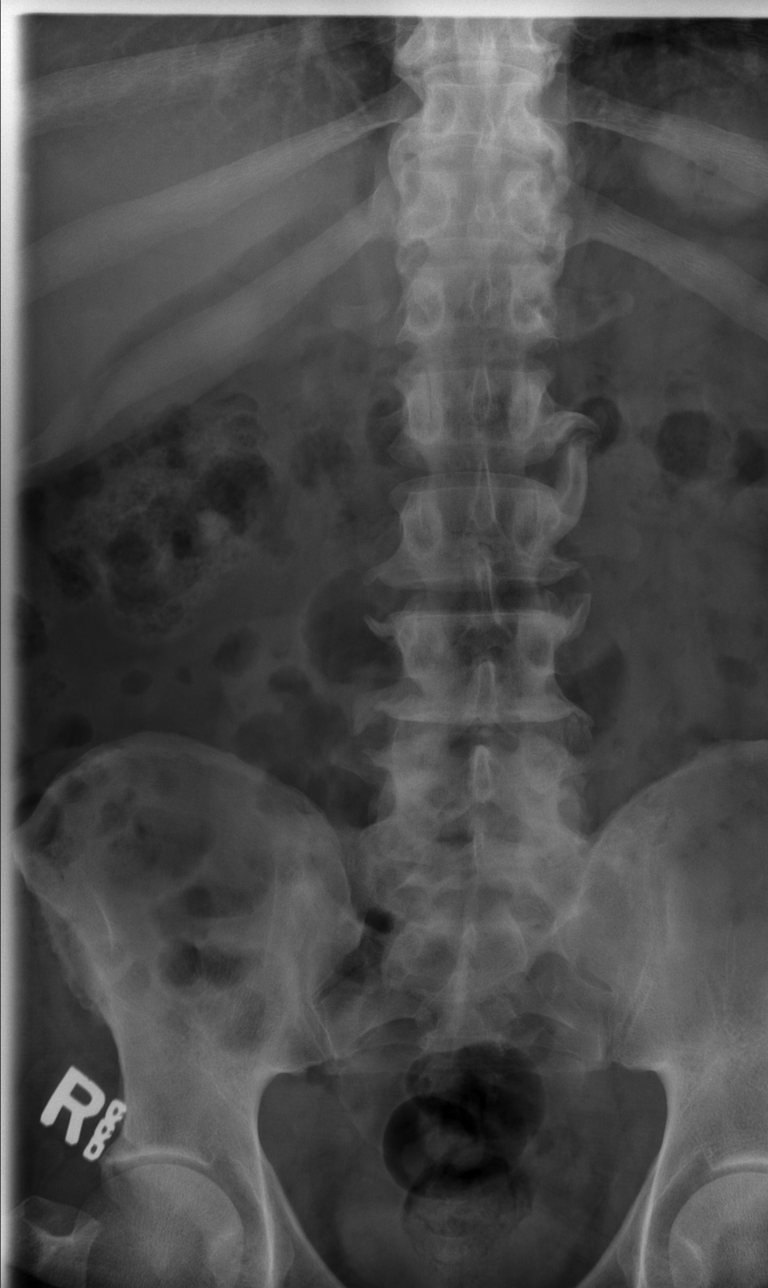

[t lumbar spine obl (1 of 2)]
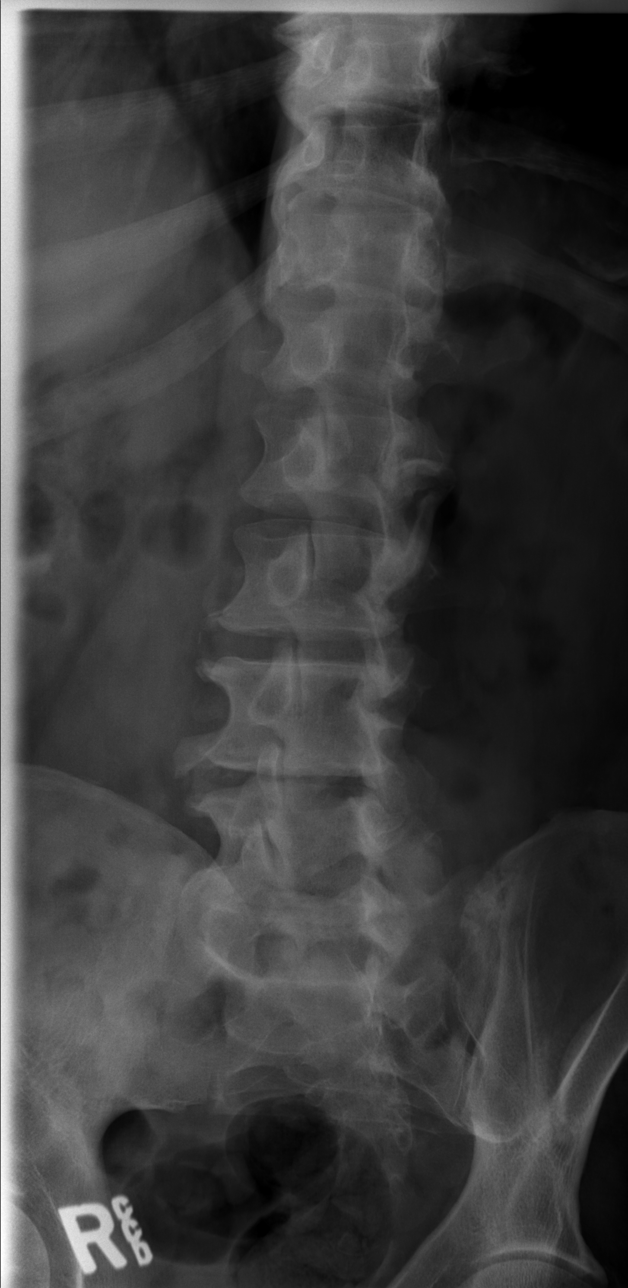

[t lumbar spine obl (2 of 2)]
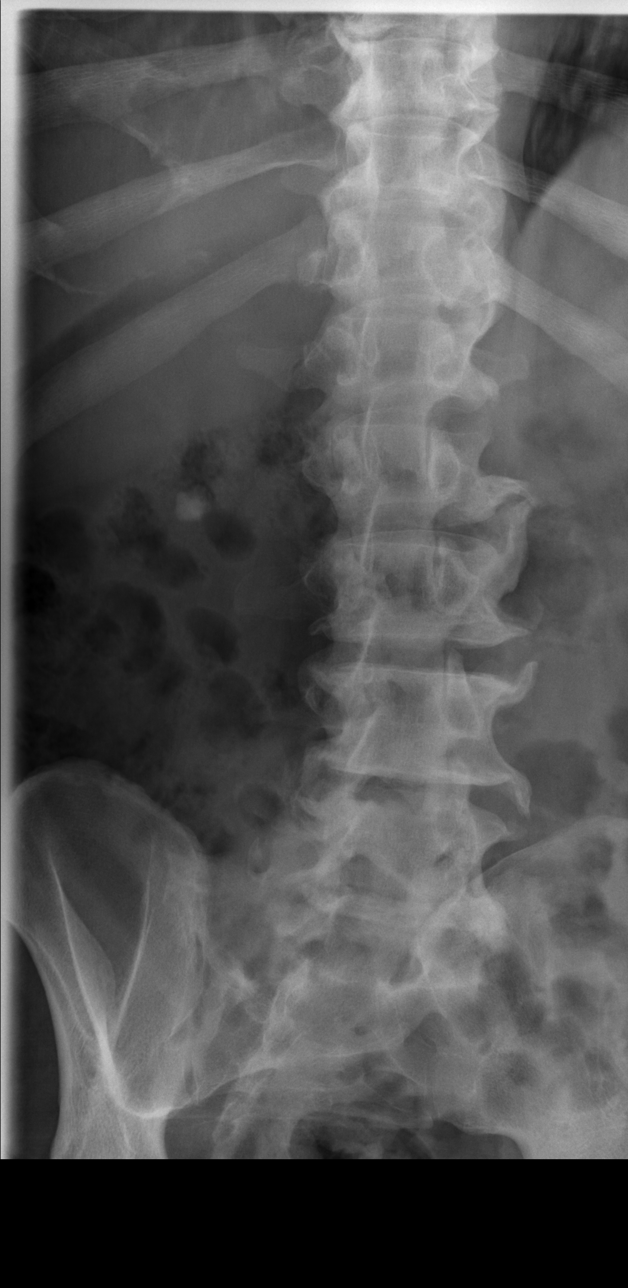

[t lumbar spine lat]
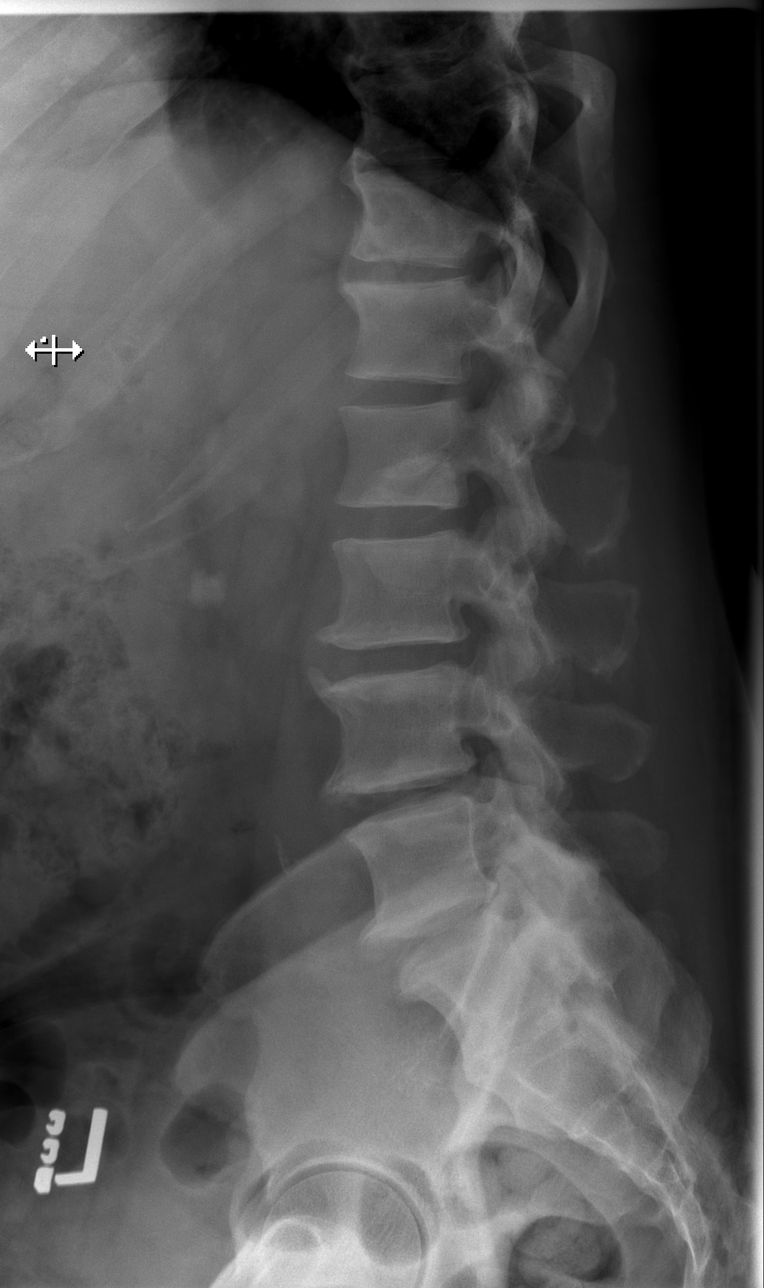

[t lumbar l-5 s-1 spot]
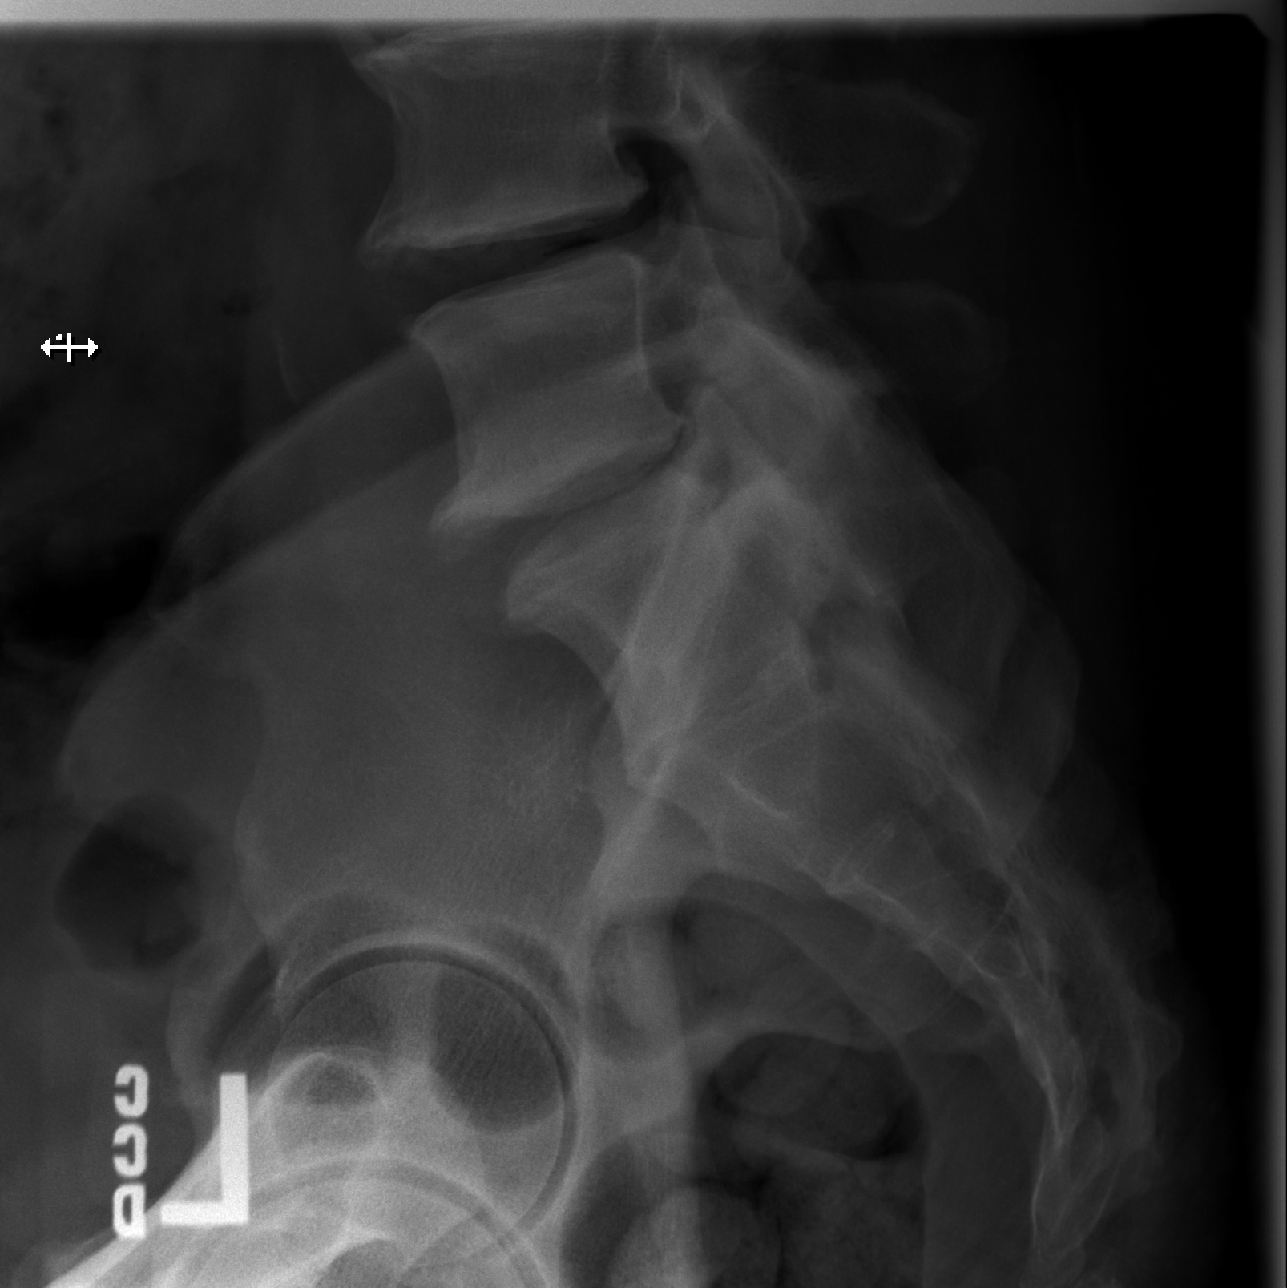

[5 of 5 positions shown; findings below may reference images not displayed]

FINDINGS: There are 5 non-rib-bearing lumbar type vertebral bodies. Normal
alignment. The lumbar vertebral body heights are maintained. There
is multilevel degenerative disc disease with bridging osteophytes,
with most advanced and mild to moderate disc space height loss at
L4-L5 and L5-S1. No acute fracture. Normal mineralization. The soft
tissues are unremarkable.
IMPRESSION: No malalignment or acute fracture in the lumbar spine. Multilevel
degenerative changes as described.

## 2023-08-06 ENCOUNTER — Other Ambulatory Visit (HOSPITAL_COMMUNITY): Payer: Self-pay

## 2023-09-14 ENCOUNTER — Other Ambulatory Visit: Payer: Self-pay

## 2023-09-14 ENCOUNTER — Other Ambulatory Visit (HOSPITAL_COMMUNITY): Payer: Self-pay

## 2023-09-14 ENCOUNTER — Other Ambulatory Visit: Payer: Self-pay | Admitting: Internal Medicine

## 2023-09-14 DIAGNOSIS — E1169 Type 2 diabetes mellitus with other specified complication: Secondary | ICD-10-CM

## 2023-09-17 ENCOUNTER — Other Ambulatory Visit: Payer: Self-pay

## 2023-11-08 ENCOUNTER — Ambulatory Visit: Payer: Self-pay

## 2023-11-08 ENCOUNTER — Other Ambulatory Visit: Payer: Self-pay | Admitting: Internal Medicine

## 2023-11-08 ENCOUNTER — Other Ambulatory Visit: Payer: Self-pay

## 2023-11-08 DIAGNOSIS — E1169 Type 2 diabetes mellitus with other specified complication: Secondary | ICD-10-CM

## 2023-11-08 DIAGNOSIS — E1159 Type 2 diabetes mellitus with other circulatory complications: Secondary | ICD-10-CM

## 2023-11-08 NOTE — Telephone Encounter (Signed)
 Call disconnected prior to transfer. Called pt back - person who answered stated he was not available, but would relay message to call [facility] back.     Copied from CRM #[id number]. Topic: Clinical - Red Word Triage >> Nov 08, 2023  9:32 AM Winona R wrote: Right big toe Swollen , nothing is helping, the swelling comes and goes but this time its not going a way. Pt also need a medication follow up before refill on Atorvastatin 

## 2023-11-12 ENCOUNTER — Other Ambulatory Visit: Payer: Self-pay | Admitting: Internal Medicine

## 2023-11-12 ENCOUNTER — Other Ambulatory Visit: Payer: Self-pay

## 2023-11-12 DIAGNOSIS — I152 Hypertension secondary to endocrine disorders: Secondary | ICD-10-CM

## 2023-11-12 NOTE — Telephone Encounter (Signed)
 Copied from CRM [id number]. Topic: Clinical - Medication Refill >> Nov 12, 2023 11:46 AM Delon HERO wrote: Medication:  amLODipine  (NORVASC ) 10 MG tablet [9069]  Rx #: [id number]  valsartan  (DIOVAN ) 40 MG tablet [[id number]]      Has the patient contacted their pharmacy? Yes (Agent: If no, request that the patient contact the pharmacy for the refill. If patient does not wish to contact the pharmacy document the reason why and proceed with request.) (Agent: If yes, when and what did the pharmacy advise?)  This is the patient's preferred pharmacy:  [facility] [address] [place] [address] Phone: [phone] Fax: [phone]  Is this the correct pharmacy for this prescription? Yes If no, delete pharmacy and type the correct one.   Has the prescription been filled recently? Yes  Is the patient out of the medication? Yes  Has the patient been seen for an appointment in the last year OR does the patient have an upcoming appointment? Yes  12/11/2023  Can we respond through MyChart? Yes  Agent: Please be advised that Rx refills may take up to 3 business days. We ask that you follow-up with your pharmacy.

## 2023-11-13 ENCOUNTER — Ambulatory Visit: Payer: Self-pay

## 2023-11-13 NOTE — Telephone Encounter (Signed)
 FYI Only or Action Required?: Action required by provider: medication refill request.  Patient was last seen in primary care on 02/02/2022 by Vicci Barnie NOVAK, MD.  Called Nurse Triage reporting Hypertension.  Symptoms began several days ago.  Interventions attempted: Rest, hydration, or home remedies.  Symptoms are: gradually worsening.  Triage Disposition: See PCP When Office is Open (Within 3 Days)  Patient/caregiver understands and will follow disposition?: Yes   Copied from CRM #[id number]. Topic: Clinical - Red Word Triage >> Nov 13, 2023  3:41 PM Rosaria BRAVO wrote: Red Word that prompted transfer to Nurse Triage:   Says his BP has been elevated 160/108. Pt wants to know if his PCP will refill his pending med refill requests prior to his appt in November. Reason for Disposition  Systolic BP >= 160 OR Diastolic >= 100  Answer Assessment - Initial Assessment Questions 1. BLOOD PRESSURE: What is your blood pressure? Did you take at least two measurements 5 minutes apart?     160/108  2. ONSET: When did you take your blood pressure?     2 Days Ago  3. HOW: How did you take your blood pressure? (e.g., automatic home BP monitor, visiting nurse)     Automatic BP Monitor  4. HISTORY: Do you have a history of high blood pressure?     Yes  5. MEDICINES: Are you taking any medicines for blood pressure? Have you missed any doses recently?     Yes, Missing Amlodipine  and Valsartan   6. OTHER SYMPTOMS: Do you have any symptoms? (e.g., blurred vision, chest pain, difficulty breathing, headache, weakness)     Denies all other symptoms  Protocols used: Blood Pressure - High-A-AH

## 2023-11-14 ENCOUNTER — Other Ambulatory Visit: Payer: Self-pay

## 2023-11-14 MED ORDER — AMLODIPINE BESYLATE 10 MG PO TABS
10.0000 mg | ORAL_TABLET | Freq: Every day | ORAL | 0 refills | Status: DC
  Filled 2023-11-14: qty 30, 30d supply, fill #0

## 2023-11-14 MED ORDER — VALSARTAN 40 MG PO TABS
40.0000 mg | ORAL_TABLET | Freq: Every day | ORAL | 0 refills | Status: DC
  Filled 2023-11-14: qty 30, 30d supply, fill #0

## 2023-11-14 NOTE — Telephone Encounter (Signed)
 Requested medication (s) are due for refill today: yes  Requested medication (s) are on the active medication list: yes  Last refill:  01/11/23 and 02/12/23  Future visit scheduled: yes  Notes to clinic:  Unable to refill per protocol, courtesy refill already given, routing for provider approval.      Requested Prescriptions  Pending Prescriptions Disp Refills   amLODipine  (NORVASC ) 10 MG tablet 90 tablet 0    Sig: Take 1 tablet (10 mg total) by mouth daily.     Cardiovascular: Calcium  Channel Blockers 2 Failed - 11/14/2023 11:52 AM      Failed - Valid encounter within last 6 months    Recent Outpatient Visits           1 year ago Type 2 diabetes mellitus with obesity   [facility] [place]. 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 [facility] Brien Belvie BRAVO, MD   3 years ago Type 2 diabetes mellitus with obesity   [facility] [place]. [facility] Vicci Sober B, MD   3 years ago Type 2 diabetes mellitus without complication, with long-term current use of insulin  [facility])   [facility] [place]. [facility] Vicci Sober B, MD   4 years ago Type 2 diabetes mellitus without complication, with long-term current use of insulin  [facility])   [facility] [place]. [facility] Vicci Sober B, MD              Passed - Last BP in normal range    BP Readings from Last 1 Encounters:  10/09/22 127/85         Passed - Last Heart Rate in normal range    Pulse Readings from Last 1 Encounters:  10/09/22 76          valsartan  (DIOVAN ) 40 MG tablet 30 tablet 0    Sig: Take 1 tablet (40 mg total) by mouth daily.     Cardiovascular:   Angiotensin Receptor Blockers Failed - 11/14/2023 11:52 AM      Failed - Cr in normal range and within 180 days    Creat  Date Value Ref Range Status  08/25/2013 1.25 0.50 - 1.35 mg/dL Final   Creatinine, Ser  Date Value Ref Range Status  10/09/2022 0.98 0.61 - 1.24 mg/dL Final   Creatinine,U  Date Value Ref Range Status  10/23/2006   Final   146.7 (NOTE)  Cutoff Values for Urine Drug Screen:        Drug Class           Cutoff (ng/mL)        Amphetamines            1000        Barbiturates             200        Cocaine  Metabolites      300        Benzodiazepines          200        Methadone  300        Opiates                 2000        Phencyclidine             25        Propoxyphene             300        Marijuana Metabolites     50  For medical purposes only.         Failed - K in normal range and within 180 days    Potassium  Date Value Ref Range Status  10/09/2022 4.3 3.5 - 5.1 mmol/L Final         Failed - Valid encounter within last 6 months    Recent Outpatient Visits           1 year ago Type 2 diabetes mellitus with obesity   [facility] [place]. 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 [facility] Brien Belvie BRAVO, MD   3 years ago Type 2 diabetes mellitus with obesity   [facility] [place]. [facility] Vicci Sober B, MD   3 years ago Type 2 diabetes mellitus without complication, with long-term current use of insulin  [facility])   [facility] [place]. [facility] Vicci Sober B, MD   4 years ago Type 2 diabetes mellitus without complication, with long-term current use of insulin  [facility])   [facility] [facility] [place].  [facility] Vicci Sober NOVAK, MD              Passed - Patient is not pregnant      Passed - Last BP in normal range    BP Readings from Last 1 Encounters:  10/09/22 127/85

## 2023-11-14 NOTE — Telephone Encounter (Signed)
 Patient has been given an earlier appointment.

## 2023-11-15 ENCOUNTER — Other Ambulatory Visit: Payer: Self-pay

## 2023-11-23 ENCOUNTER — Other Ambulatory Visit: Payer: Self-pay

## 2023-11-23 ENCOUNTER — Ambulatory Visit: Payer: Self-pay | Attending: Internal Medicine | Admitting: Internal Medicine

## 2023-11-23 ENCOUNTER — Other Ambulatory Visit: Payer: Self-pay | Admitting: Pharmacist

## 2023-11-23 VITALS — BP 130/79 | HR 99 | Temp 98.2°F | Ht 71.0 in | Wt 241.0 lb

## 2023-11-23 DIAGNOSIS — L280 Lichen simplex chronicus: Secondary | ICD-10-CM

## 2023-11-23 DIAGNOSIS — Z1211 Encounter for screening for malignant neoplasm of colon: Secondary | ICD-10-CM

## 2023-11-23 DIAGNOSIS — E119 Type 2 diabetes mellitus without complications: Secondary | ICD-10-CM

## 2023-11-23 DIAGNOSIS — I152 Hypertension secondary to endocrine disorders: Secondary | ICD-10-CM

## 2023-11-23 DIAGNOSIS — E785 Hyperlipidemia, unspecified: Secondary | ICD-10-CM

## 2023-11-23 DIAGNOSIS — Z6833 Body mass index (BMI) 33.0-33.9, adult: Secondary | ICD-10-CM

## 2023-11-23 DIAGNOSIS — E669 Obesity, unspecified: Secondary | ICD-10-CM

## 2023-11-23 DIAGNOSIS — M654 Radial styloid tenosynovitis [de Quervain]: Secondary | ICD-10-CM

## 2023-11-23 DIAGNOSIS — Z7984 Long term (current) use of oral hypoglycemic drugs: Secondary | ICD-10-CM

## 2023-11-23 DIAGNOSIS — I10 Essential (primary) hypertension: Secondary | ICD-10-CM

## 2023-11-23 DIAGNOSIS — E1169 Type 2 diabetes mellitus with other specified complication: Secondary | ICD-10-CM

## 2023-11-23 DIAGNOSIS — Z23 Encounter for immunization: Secondary | ICD-10-CM

## 2023-11-23 DIAGNOSIS — E1159 Type 2 diabetes mellitus with other circulatory complications: Secondary | ICD-10-CM

## 2023-11-23 LAB — GLUCOSE, POCT (MANUAL RESULT ENTRY): POC Glucose: 235 mg/dL — AB (ref 70–99)

## 2023-11-23 LAB — POCT GLYCOSYLATED HEMOGLOBIN (HGB A1C): HbA1c, POC (controlled diabetic range): 7.7 % — AB (ref 0.0–7.0)

## 2023-11-23 MED ORDER — IBUPROFEN 600 MG PO TABS
600.0000 mg | ORAL_TABLET | Freq: Three times a day (TID) | ORAL | 0 refills | Status: AC | PRN
  Filled 2023-11-23: qty 30, 10d supply, fill #0

## 2023-11-23 MED ORDER — ACCU-CHEK GUIDE TEST VI STRP
ORAL_STRIP | 6 refills | Status: AC
  Filled 2023-11-23: qty 100, 33d supply, fill #0

## 2023-11-23 MED ORDER — METFORMIN HCL ER 500 MG PO TB24
1000.0000 mg | ORAL_TABLET | Freq: Two times a day (BID) | ORAL | 6 refills | Status: AC
  Filled 2023-11-23 – 2023-12-24 (×2): qty 120, 30d supply, fill #0

## 2023-11-23 MED ORDER — ACCU-CHEK GUIDE W/DEVICE KIT
PACK | 0 refills | Status: AC
  Filled 2023-11-23: qty 1, 30d supply, fill #0

## 2023-11-23 MED ORDER — ATORVASTATIN CALCIUM 40 MG PO TABS
40.0000 mg | ORAL_TABLET | Freq: Every day | ORAL | 6 refills | Status: AC
  Filled 2023-11-23 – 2023-12-24 (×2): qty 30, 30d supply, fill #0

## 2023-11-23 MED ORDER — TRIAMCINOLONE ACETONIDE 0.1 % EX CREA
1.0000 | TOPICAL_CREAM | Freq: Two times a day (BID) | CUTANEOUS | 0 refills | Status: AC
  Filled 2023-11-23: qty 30, 15d supply, fill #0

## 2023-11-23 MED ORDER — ACCU-CHEK SOFTCLIX LANCETS MISC
6 refills | Status: AC
  Filled 2023-11-23: qty 100, 33d supply, fill #0

## 2023-11-23 MED ORDER — ACCU-CHEK GUIDE W/DEVICE KIT
PACK | 0 refills | Status: DC
  Filled 2023-11-23: qty 1, 30d supply, fill #0

## 2023-11-23 MED ORDER — VALSARTAN 40 MG PO TABS
40.0000 mg | ORAL_TABLET | Freq: Every day | ORAL | 6 refills | Status: AC
  Filled 2023-11-23 – 2023-12-24 (×2): qty 30, 30d supply, fill #0

## 2023-11-23 MED ORDER — AMLODIPINE BESYLATE 10 MG PO TABS
10.0000 mg | ORAL_TABLET | Freq: Every day | ORAL | 6 refills | Status: AC
Start: 2023-11-23 — End: ?
  Filled 2023-11-23 – 2023-12-24 (×2): qty 30, 30d supply, fill #0

## 2023-11-23 NOTE — Patient Instructions (Signed)
  VISIT SUMMARY: During today's visit, we discussed your right arm pain, foot rash, diabetes management, blood pressure, and cholesterol levels. We reviewed your current medications and made some adjustments to better manage your conditions.  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 You will use a splint at night to immobilize your wrist and thumb, take ibuprofen  with food for pain and inflammation, and follow up with orthopedics for further evaluation and possible corticosteroid injections. Please apply for the [facility] discount to help with the cost of the orthopedic referral.  -TYPE 2 DIABETES MELLITUS: Your blood sugar levels are above the target with an A1c of 7.7%. We will increase your metformin  dose to 1000 mg twice daily after your current supply runs out. A prescription for blood glucose testing supplies has been sent. Continue to reduce sugar intake in your diet and maintain regular exercise.  -HYPERTENSION: Your blood pressure is well-controlled at 130/79 mmHg with your current medications. Continue taking amlodipine  and valsartan  as prescribed, and keep limiting your salt intake by using Mrs. Dash.  -HYPERLIPIDEMIA: This condition involves high cholesterol levels. You have not been taking your atorvastatin  since March. A refill for atorvastatin  40 mg daily has been sent. Please limit butter and grease in your diet. Lab tests for cholesterol, kidney, and liver function have been ordered.  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 You will use triamcinolone cream on the affected areas. If there is no improvement, please apply for the [facility] discount for a dermatology referral.  INSTRUCTIONS: Please follow up with orthopedics for your right arm pain and with dermatology if your foot rash does not improve. Continue with your  current medications and lifestyle modifications as discussed. Lab tests for cholesterol, kidney, and liver function have been ordered. Apply for the [facility] discount to help with referral costs.                      Contains text generated by Abridge.                                 Contains text generated by Abridge.

## 2023-11-23 NOTE — Progress Notes (Signed)
 Patient ID: ADE STMARIE, male    DOB: [birth year]  MRN: [id number]  CC: Hypertension (HTN & DM f/u./R arm pain X2 weeks/Callous & itching of bilateral feet /Flu vax administered on 11/23/2023 - C.A.)   Subjective: [name] is a 53 y.o. male who presents for chronic ds management. Last seen 01/2022 His concerns today include:  Patient with history of DM type II, HL, tobacco dependence, HTN, chronic pancreatitis, GERD   Discussed the use of AI scribe software for clinical note transcription with the patient, who gave verbal consent to proceed.  History of Present Illness [name] is a 53 year old male with diabetes who presents with right arm pain and foot rash.  He has been experiencing pain in his right arm for the past two weeks. The pain radiates from the thumb up the arm and worsens at night, requiring him to sleep on the arm to alleviate discomfort. He describes an inability to squeeze or pick up objects, such as his cell phone, and difficulty holding a plate. Numbness and tingling are present in the hand, particularly around the thumb. He is right-handed and works in [employer], which involves Market researcher. No neck pain.  He has had a rash on his feet for approximately eight months, which is itchy and sometimes bleeds when scratched. The rash is present on both feet, with the left foot being affected for the past six months.  DM: Results for orders placed or performed in visit on 11/23/23  POCT glucose (manual entry)   Collection Time: 11/23/23  3:26 PM  Result Value Ref Range   POC Glucose 235 (A) 70 - 99 mg/dl  POCT glycosylated hemoglobin (Hb A1C)   Collection Time: 11/23/23  3:32 PM  Result Value Ref Range   Hemoglobin A1C     HbA1c POC (<> result, manual entry)     HbA1c, POC (prediabetic range)     HbA1c, POC (controlled diabetic range) 7.7 (A) 0.0 - 7.0 %  He has a history of diabetes and is currently taking metformin  750 mg extended release twice  daily. He does not check his blood sugars regularly but maintains a diet low in sugary foods and drinks plenty of water . He was out of metformin  for about three months but has been back on it for two months.   HTN/HL:  He is also on medication for high blood pressure, including amlodipine  and valsartan , and is prescribed atorvastatin  for cholesterol, though he has not filled this prescription since March. He takes his blood pressure medication daily and uses Mrs. Dash to limit salt intake. No CP/SOB/Jonathan edema.      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 90 capsule 0   Continuous Blood Gluc Receiver (FREESTYLE LIBRE READER) DEVI Use as directed. (Patient not taking: Reported on 11/23/2023) 1 each 0   famotidine  (PEPCID ) 10 MG tablet Take 1 tablet (10 mg total) by mouth 2 (two) times a day if needed for heartburn. (Patient not taking: Reported on 11/23/2023) 60 tablet 1   famotidine  (PEPCID ) 20 MG tablet Take 1 tablet (20 mg total) by mouth at night if needed for heartburn. (Patient  not taking: Reported on 11/23/2023) 90 tablet 1   tamsulosin  (FLOMAX ) 0.4 MG CAPS capsule Take 1 capsule (0.4 mg total) by mouth daily. (Patient not taking: Reported on 11/23/2023) 90 capsule 1   No current facility-administered medications on file prior to visit.    No Known Allergies  Social History   Socioeconomic History   Marital status: Married    Spouse name: Not on file   Number of children: Not on file   Years of education: Not on file   Highest education level: Not on file  Occupational History   Not on file  Tobacco Use   Smoking status: Every Day    Current packs/day: 1.00    Types: Cigarettes   Smokeless tobacco: Never  Vaping Use   Vaping status: Never Used  Substance and Sexual Activity   Alcohol use: Yes    Alcohol/week: 12.0 standard drinks of alcohol    Types: 12 Cans of beer per week   Drug use: No   Sexual activity: Not Currently  Other Topics Concern   Not on file  Social History Narrative   Not on file   Social Drivers of Health   Financial Resource Strain: Low Risk  (11/23/2023)   Overall Financial Resource Strain (CARDIA)    Difficulty of Paying Living Expenses: Not very hard  Food Insecurity: No Food Insecurity (11/23/2023)   Hunger Vital Sign    Worried About Running Out of Food in the Last Year: Never true    Ran Out of Food in the Last Year: Never true  Transportation Needs: No Transportation Needs (11/23/2023)   PRAPARE - Administrator, Civil Service (Medical): No    Lack of Transportation (Non-Medical): No  Physical Activity: Inactive (11/23/2023)   Exercise Vital Sign    Days of Exercise per Week: 0 days    Minutes of Exercise per Session: 0 min  Stress: No Stress Concern Present (11/23/2023)   [employer] of Occupational Health - Occupational Stress Questionnaire    Feeling of Stress: Only a little  Social Connections: Not on file  Intimate Partner Violence: Not At Risk (11/23/2023)   Humiliation, Afraid,  Rape, and Kick questionnaire    Fear of Current or Ex-Partner: No    Emotionally Abused: No    Physically Abused: No    Sexually Abused: No    Family History  Problem Relation Age of Onset   Stroke Father    Hypertension Father     Past Surgical History:  Procedure Laterality Date   DG 3RD DIGIT RIGHT HAND     DG 4TH DIGIT RIGHT HAND      ROS: Review of Systems Negative except as stated above  PHYSICAL EXAM: BP 130/79 (BP Location: Left Arm, Patient Position: Sitting, Cuff Size: Large)   Pulse 99   Temp 98.2 F (36.8 C) (Oral)   Ht 5' 11 (1.803 m)   Wt 241  lb (109.3 kg)   SpO2 94%   BMI 33.61 kg/m   Physical Exam   General appearance - alert, well appearing, and in no distress Mental status - alert, oriented to person, place, and time Chest - clear to auscultation, no wheezes, rales or rhonchi, symmetric air entry Heart - normal rate, regular rhythm, normal S1, S2, no murmurs, rubs, clicks or gallops Musculoskeletal - RT hand: no edema or erythema.  Good range of motion of the 2nd through 5th fingers.  Mild to moderate discomfort with passive range of motion of the thumb.  Extension of the thumb causes pain along the radial aspect of the wrist.  Tenderness along the radial aspect of the wrist including the muscles.  Grip 5/5 left, 4/5 right.  He has a hard time doing a full grip on the right due to pain in the thumb when doing so. Power UE 5/5 prox/distally BL Extremities - peripheral pulses normal, no pedal edema, no clubbing or cyanosis Skin -patient has about 3 x 3 cm slightly raised hyperpigmented rough area on the medial aspect of the right ankle.  Smaller 1 noted on the left.  See picture below.      Latest Ref Rng & Units 10/09/2022    7:55 PM 10/05/2022   11:21 PM 03/01/2021   11:09 AM  CMP  Glucose 70 - 99 mg/dL 891  827  860   BUN 6 - 20 mg/dL 14  9  10    Creatinine 0.61 - 1.24 mg/dL 9.01  9.11  9.14   Sodium 135 - 145 mmol/L 133  140  138   Potassium 3.5  - 5.1 mmol/L 4.3  3.7  4.6   Chloride 98 - 111 mmol/L 101  102  97   CO2 22 - 32 mmol/L 24  19  24    Calcium  8.9 - 10.3 mg/dL 8.9  9.6  89.9   Total Protein 6.5 - 8.1 g/dL 6.9  7.0  7.2   Total Bilirubin 0.3 - 1.2 mg/dL 0.4  0.6  0.5   Alkaline Phos 38 - 126 U/L 67  68  92   AST 15 - 41 U/L 12  21  18    ALT 0 - 44 U/L 10  20  18     Lipid Panel     Component Value Date/Time   CHOL 264 (H) 03/01/2021 1109   TRIG 354 (H) 03/01/2021 1109   HDL 47 03/01/2021 1109   CHOLHDL 5.6 (H) 03/01/2021 1109   CHOLHDL 5.2 08/25/2013 1746   VLDL 52 (H) 08/25/2013 1746   LDLCALC 151 (H) 03/01/2021 1109    CBC    Component Value Date/Time   WBC 8.3 10/09/2022 1955   RBC 4.61 10/09/2022 1955   HGB 15.1 10/09/2022 1955   HGB 16.1 03/01/2021 1109   HCT 42.4 10/09/2022 1955   HCT 46.5 03/01/2021 1109   PLT 383 10/09/2022 1955   PLT 483 (H) 03/01/2021 1109   MCV 92.0 10/09/2022 1955   MCV 94 03/01/2021 1109   MCH 32.8 10/09/2022 1955   MCHC 35.6 10/09/2022 1955   RDW 12.0 10/09/2022 1955   RDW 12.3 03/01/2021 1109   LYMPHSABS 3.2 10/09/2022 1955   LYMPHSABS 3.5 (H) 03/01/2021 1109   MONOABS 0.9 10/09/2022 1955   EOSABS 0.3 10/09/2022 1955   EOSABS 0.6 (H) 03/01/2021 1109   BASOSABS 0.0 10/09/2022 1955   BASOSABS 0.1 03/01/2021 1109    ASSESSMENT AND PLAN: 1. Type 2 diabetes mellitus in patient with  obesity (HCC) (Primary) A1c not at goal.  Increase metformin  to 1000 mg twice a day. Discussed and encouraged healthy eating habits. - POCT glucose (manual entry) - POCT glycosylated hemoglobin (Hb A1C) - CBC - Comprehensive metabolic panel with GFR - Microalbumin / creatinine urine ratio - metFORMIN  (GLUCOPHAGE -XR) 500 MG 24 hr tablet; Take 2 tablets (1,000 mg total) by mouth 2 (two) times daily.  Dispense: 120 tablet; Refill: 6  2. Diabetes mellitus treated with oral medication (HCC) See #1 above  3. Hypertension associated with diabetes (HCC) At goal. Continue amlodipine  10 mg  daily and valsartan  40 mg daily.  Continue low-salt diet. - amLODipine  (NORVASC ) 10 MG tablet; Take 1 tablet (10 mg total) by mouth daily.  Dispense: 30 tablet; Refill: 6 - valsartan  (DIOVAN ) 40 MG tablet; Take 1 tablet (40 mg total) by mouth daily.  Dispense: 30 tablet; Refill: 6 - ibuprofen  (ADVIL ) 600 MG tablet; Take 1 tablet (600 mg total) by mouth every 8 (eight) hours as needed (take with food).  Dispense: 30 tablet; Refill: 0  4. Hyperlipidemia associated with type 2 diabetes mellitus (HCC) Encourage compliance with Lipitor - Lipid panel - atorvastatin  (LIPITOR) 40 MG tablet; Take 1 tablet (40 mg total) by mouth daily.  Dispense: 30 tablet; Refill: 6  5. De Quervain's tenosynovitis, right Ibuprofen  PRN Advised to get wrist splint OTC - AMB referral to orthopedics  6. Lichenoid dermatitis Patient has dermatitis noted at medial aspect of both heels.  Will give a trial of triamcinolone cream.  If no improvement, will refer to dermatology.  Advised to apply for [facility] discomfort - triamcinolone cream (KENALOG) 0.1 %; Apply 1 Application topically 2 (two) times daily.  Dispense: 30 g; Refill: 0  7. Need for immunization against influenza - Flu vaccine trivalent PF, 6mos and older(Flulaval,Afluria,Fluarix,Fluzone)  8. Screening for colon cancer - Fecal occult blood, imunochemical([facility])   Patient was given the opportunity to ask questions.  Patient verbalized understanding of the plan and was able to repeat key elements of the plan.   This documentation was completed using Paediatric nurse.  Any transcriptional errors are unintentional.  Orders Placed This Encounter  Procedures   Fecal occult blood, imunochemical([facility])   Flu vaccine trivalent PF, 6mos and older(Flulaval,Afluria,Fluarix,Fluzone)   CBC   Lipid panel   Comprehensive metabolic panel with GFR   Microalbumin / creatinine urine ratio   AMB referral to orthopedics   POCT glucose  (manual entry)   POCT glycosylated hemoglobin (Hb A1C)     Requested Prescriptions   Signed Prescriptions Disp Refills   metFORMIN  (GLUCOPHAGE -XR) 500 MG 24 hr tablet 120 tablet 6    Sig: Take 2 tablets (1,000 mg total) by mouth 2 (two) times daily.   amLODipine  (NORVASC ) 10 MG tablet 30 tablet 6    Sig: Take 1 tablet (10 mg total) by mouth daily.   valsartan  (DIOVAN ) 40 MG tablet 30 tablet 6    Sig: Take 1 tablet (40 mg total) by mouth daily.   atorvastatin  (LIPITOR) 40 MG tablet 30 tablet 6    Sig: Take 1 tablet (40 mg total) by mouth daily.   triamcinolone cream (KENALOG) 0.1 % 30 g 0    Sig: Apply 1 Application topically 2 (two) times daily.   ibuprofen  (ADVIL ) 600 MG tablet 30 tablet 0    Sig: Take 1 tablet (600 mg total) by mouth every 8 (eight) hours as needed (take with food).    Return in about 4 months (around 03/25/2024).  Barnie Louder, MD, FACP

## 2023-11-24 ENCOUNTER — Ambulatory Visit: Payer: Self-pay | Admitting: Internal Medicine

## 2023-11-24 NOTE — Progress Notes (Signed)
 Cholesterol levels are markedly elevated at 327 with goal being less than 200.  This increases risk for heart attack and strokes.  Please take the atorvastatin  as prescribed.  Blood cell counts are normal.  Kidney and liver function tests are good.  Slight decrease in sodium level.

## 2023-11-25 LAB — CBC
Hematocrit: 43.6 % (ref 37.5–51.0)
Hemoglobin: 15 g/dL (ref 13.0–17.7)
MCH: 33.8 pg — ABNORMAL HIGH (ref 26.6–33.0)
MCHC: 34.4 g/dL (ref 31.5–35.7)
MCV: 98 fL — ABNORMAL HIGH (ref 79–97)
Platelets: 395 x10E3/uL (ref 150–450)
RBC: 4.44 x10E6/uL (ref 4.14–5.80)
RDW: 12.4 % (ref 11.6–15.4)
WBC: 8.3 x10E3/uL (ref 3.4–10.8)

## 2023-11-25 LAB — COMPREHENSIVE METABOLIC PANEL WITH GFR
ALT: 23 IU/L (ref 0–44)
AST: 26 IU/L (ref 0–40)
Albumin: 4.4 g/dL (ref 3.8–4.9)
Alkaline Phosphatase: 83 IU/L (ref 47–123)
BUN/Creatinine Ratio: 11 (ref 9–20)
BUN: 11 mg/dL (ref 6–24)
Bilirubin Total: 0.4 mg/dL (ref 0.0–1.2)
CO2: 20 mmol/L (ref 20–29)
Calcium: 9.5 mg/dL (ref 8.7–10.2)
Chloride: 94 mmol/L — ABNORMAL LOW (ref 96–106)
Creatinine, Ser: 1.04 mg/dL (ref 0.76–1.27)
Globulin, Total: 3 g/dL (ref 1.5–4.5)
Glucose: 214 mg/dL — ABNORMAL HIGH (ref 70–99)
Potassium: 4.1 mmol/L (ref 3.5–5.2)
Sodium: 132 mmol/L — ABNORMAL LOW (ref 134–144)
Total Protein: 7.4 g/dL (ref 6.0–8.5)
eGFR: 87 mL/min/1.73 (ref >59)

## 2023-11-25 LAB — MICROALBUMIN / CREATININE URINE RATIO
Creatinine, Urine: 222.9 mg/dL
Microalb/Creat Ratio: 10 mg/g{creat} (ref 0–29)
Microalbumin, Urine: 22.1 ug/mL

## 2023-11-25 LAB — LIPID PANEL
Chol/HDL Ratio: 7.4 ratio — ABNORMAL HIGH (ref 0.0–5.0)
Cholesterol, Total: 327 mg/dL — ABNORMAL HIGH (ref 100–199)
HDL: 44 mg/dL (ref >39)
LDL Chol Calc (NIH): 134 mg/dL — ABNORMAL HIGH (ref 0–99)
Triglycerides: 780 mg/dL (ref 0–149)
VLDL Cholesterol Cal: 149 mg/dL — ABNORMAL HIGH (ref 5–40)

## 2023-11-28 ENCOUNTER — Other Ambulatory Visit: Payer: Self-pay

## 2023-11-30 ENCOUNTER — Other Ambulatory Visit: Payer: Self-pay

## 2023-12-04 ENCOUNTER — Other Ambulatory Visit: Payer: Self-pay

## 2023-12-11 ENCOUNTER — Ambulatory Visit: Payer: Self-pay | Admitting: Internal Medicine

## 2023-12-18 ENCOUNTER — Ambulatory Visit: Payer: Self-pay | Admitting: Orthopedic Surgery

## 2023-12-18 NOTE — Progress Notes (Deleted)
   [name] - 53 y.o. male MRN [id number]  Date of birth: 02-04-1970  Office Visit Note: Visit Date: 12/18/2023 PCP: Vicci Barnie NOVAK, MD Referred by: Vicci Barnie NOVAK, MD  Subjective: No chief complaint on file.  HPI: [name] is a pleasant 53 y.o. male who presents today for ***  Pertinent ROS were reviewed with the patient and found to be negative unless otherwise specified above in HPI.   Visit Reason: Duration of symptoms: Hand dominance: {RIGHT/LEFT:20294} Occupation: Diabetic: {yes/no:20286} Smoking: {yes/no:20286} Heart/Lung History: Blood Thinners:   Prior Testing/EMG: Injections (Date): Treatments: Prior Surgery:    Assessment & Plan: Visit Diagnoses: No diagnosis found.  Plan: ***  Follow-up: No follow-ups on file.   Meds & Orders: No orders of the defined types were placed in this encounter.  No orders of the defined types were placed in this encounter.    Procedures: No procedures performed      Clinical History: No specialty comments available.  He reports that he has been smoking cigarettes. He has never used smokeless tobacco.  Recent Labs    11/23/23 1532  HGBA1C 7.7*    Objective:   Vital Signs: There were no vitals taken for this visit.  Physical Exam  Gen: Well-appearing, in no acute distress; non-toxic CV: Regular Rate. Well-perfused. Warm.  Resp: Breathing unlabored on room air; no wheezing. Psych: Fluid speech in conversation; appropriate affect; normal thought process  Ortho Exam - ***   Imaging: No results found.  Past Medical/Family/Surgical/Social History: Medications & Allergies reviewed per EMR, new medications updated. 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 01/16/2019   Alcohol use 01/14/2019   Controlled type 2 diabetes mellitus (HCC) 01/14/2019   Tobacco dependence 03/28/2018   Gastroesophageal reflux disease without esophagitis 03/28/2018   Dental cavities 11/23/2017   Essential hypertension 11/23/2017   Past Medical History:  Diagnosis Date   Diabetes mellitus without complication (HCC)    Renal disorder    Family History  Problem Relation Age of Onset   Stroke Father    Hypertension Father    Past Surgical History:  Procedure Laterality Date   DG 3RD DIGIT RIGHT HAND     DG 4TH DIGIT RIGHT HAND     Social History   Occupational History   Not on file  Tobacco Use   Smoking status: Every Day    Current packs/day: 1.00    Types: Cigarettes   Smokeless tobacco: Never  Vaping Use   Vaping status: Never Used  Substance and Sexual Activity   Alcohol use: Yes    Alcohol/week: 12.0 standard drinks of alcohol    Types: 12 Cans of beer per week   Drug use: No   Sexual activity: Not Currently    [name]) Arlinda, M.D. [facility], Hand Surgery

## 2023-12-24 ENCOUNTER — Other Ambulatory Visit: Payer: Self-pay
